# Patient Record
Sex: Male | Born: 1955 | Race: White | Hispanic: No | Marital: Married | Smoking: Former smoker
Health system: Southern US, Community
[De-identification: ages and names within clinical notes are randomized; demographics above are authoritative.]

## PROBLEM LIST (undated history)

## (undated) ENCOUNTER — Emergency Department: Admission: EM | Payer: Self-pay | Source: Home / Self Care

## (undated) ENCOUNTER — Ambulatory Visit: Admission: EM | Payer: HMO | Source: Home / Self Care

## (undated) DIAGNOSIS — M009 Pyogenic arthritis, unspecified: Secondary | ICD-10-CM

## (undated) DIAGNOSIS — F17200 Nicotine dependence, unspecified, uncomplicated: Secondary | ICD-10-CM

## (undated) DIAGNOSIS — E785 Hyperlipidemia, unspecified: Secondary | ICD-10-CM

## (undated) DIAGNOSIS — J189 Pneumonia, unspecified organism: Secondary | ICD-10-CM

## (undated) DIAGNOSIS — I10 Essential (primary) hypertension: Secondary | ICD-10-CM

## (undated) DIAGNOSIS — I2510 Atherosclerotic heart disease of native coronary artery without angina pectoris: Secondary | ICD-10-CM

## (undated) DIAGNOSIS — E119 Type 2 diabetes mellitus without complications: Secondary | ICD-10-CM

## (undated) DIAGNOSIS — I219 Acute myocardial infarction, unspecified: Secondary | ICD-10-CM

## (undated) HISTORY — DX: Atherosclerotic heart disease of native coronary artery without angina pectoris: I25.10

## (undated) HISTORY — DX: Hyperlipidemia, unspecified: E78.5

## (undated) HISTORY — DX: Nicotine dependence, unspecified, uncomplicated: F17.200

## (undated) HISTORY — DX: Pyogenic arthritis, unspecified: M00.9

## (undated) HISTORY — DX: Type 2 diabetes mellitus without complications: E11.9

## (undated) HISTORY — PX: COLON SURGERY: SHX602

## (undated) HISTORY — PX: OTHER SURGICAL HISTORY: SHX169

## (undated) HISTORY — PX: APPENDECTOMY: SHX54

---
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
## 2002-12-23 ENCOUNTER — Encounter: Payer: Self-pay | Admitting: Family Medicine

## 2002-12-23 LAB — CONVERTED CEMR LAB: Hgb A1c MFr Bld: 7.9 %

## 2003-04-23 ENCOUNTER — Encounter: Payer: Self-pay | Admitting: Family Medicine

## 2003-04-23 LAB — CONVERTED CEMR LAB: Hgb A1c MFr Bld: 5.5 %

## 2003-10-23 ENCOUNTER — Encounter: Payer: Self-pay | Admitting: Family Medicine

## 2003-10-23 LAB — CONVERTED CEMR LAB: Hgb A1c MFr Bld: 5.7 %

## 2004-04-22 ENCOUNTER — Encounter: Payer: Self-pay | Admitting: Family Medicine

## 2004-04-22 LAB — CONVERTED CEMR LAB: Hgb A1c MFr Bld: 5.9 %

## 2004-10-22 ENCOUNTER — Encounter: Payer: Self-pay | Admitting: Family Medicine

## 2005-01-20 ENCOUNTER — Encounter: Payer: Self-pay | Admitting: Family Medicine

## 2005-02-04 ENCOUNTER — Ambulatory Visit: Payer: Self-pay | Admitting: Family Medicine

## 2005-02-09 ENCOUNTER — Ambulatory Visit: Payer: Self-pay | Admitting: Family Medicine

## 2005-02-24 ENCOUNTER — Ambulatory Visit: Payer: Self-pay | Admitting: Family Medicine

## 2005-04-22 ENCOUNTER — Encounter: Payer: Self-pay | Admitting: Family Medicine

## 2005-04-22 LAB — CONVERTED CEMR LAB: PSA: 0.31 ng/mL

## 2005-05-03 ENCOUNTER — Ambulatory Visit: Payer: Self-pay | Admitting: Family Medicine

## 2005-05-05 ENCOUNTER — Ambulatory Visit: Payer: Self-pay | Admitting: Family Medicine

## 2005-11-08 ENCOUNTER — Ambulatory Visit: Payer: Self-pay | Admitting: Family Medicine

## 2006-05-22 ENCOUNTER — Encounter: Payer: Self-pay | Admitting: Family Medicine

## 2006-05-22 LAB — CONVERTED CEMR LAB: PSA: 0.61 ng/mL

## 2006-06-08 ENCOUNTER — Ambulatory Visit: Payer: Self-pay | Admitting: Family Medicine

## 2006-06-10 ENCOUNTER — Ambulatory Visit: Payer: Self-pay | Admitting: Family Medicine

## 2006-06-23 ENCOUNTER — Ambulatory Visit: Payer: Self-pay | Admitting: Family Medicine

## 2006-08-03 ENCOUNTER — Ambulatory Visit: Payer: Self-pay | Admitting: Family Medicine

## 2006-08-04 ENCOUNTER — Ambulatory Visit: Payer: Self-pay | Admitting: Family Medicine

## 2006-08-05 ENCOUNTER — Ambulatory Visit: Payer: Self-pay | Admitting: Family Medicine

## 2006-08-08 ENCOUNTER — Ambulatory Visit: Payer: Self-pay | Admitting: Family Medicine

## 2006-09-12 ENCOUNTER — Ambulatory Visit: Payer: Self-pay | Admitting: Family Medicine

## 2006-11-22 ENCOUNTER — Encounter: Payer: Self-pay | Admitting: Family Medicine

## 2006-11-22 LAB — CONVERTED CEMR LAB: Hgb A1c MFr Bld: 7.2 %

## 2006-12-12 ENCOUNTER — Ambulatory Visit: Payer: Self-pay | Admitting: Family Medicine

## 2006-12-12 LAB — CONVERTED CEMR LAB
BUN: 9 mg/dL (ref 6–23)
Creatinine, Ser: 0.9 mg/dL (ref 0.4–1.5)
GFR calc Af Amer: 115 mL/min
GFR calc non Af Amer: 95 mL/min
Hgb A1c MFr Bld: 7.2 %
Hgb A1c MFr Bld: 7.2 % — ABNORMAL HIGH (ref 4.6–6.0)
Potassium: 4.3 meq/L (ref 3.5–5.1)
Sodium: 139 meq/L (ref 135–145)

## 2006-12-14 ENCOUNTER — Ambulatory Visit: Payer: Self-pay | Admitting: Family Medicine

## 2007-02-08 ENCOUNTER — Encounter: Payer: Self-pay | Admitting: Family Medicine

## 2007-02-08 DIAGNOSIS — E1129 Type 2 diabetes mellitus with other diabetic kidney complication: Secondary | ICD-10-CM | POA: Insufficient documentation

## 2007-02-08 DIAGNOSIS — E786 Lipoprotein deficiency: Secondary | ICD-10-CM | POA: Insufficient documentation

## 2007-02-08 DIAGNOSIS — F17200 Nicotine dependence, unspecified, uncomplicated: Secondary | ICD-10-CM | POA: Insufficient documentation

## 2007-02-08 DIAGNOSIS — R809 Proteinuria, unspecified: Secondary | ICD-10-CM

## 2007-02-08 DIAGNOSIS — I10 Essential (primary) hypertension: Secondary | ICD-10-CM | POA: Insufficient documentation

## 2007-05-18 ENCOUNTER — Encounter (INDEPENDENT_AMBULATORY_CARE_PROVIDER_SITE_OTHER): Payer: Self-pay | Admitting: *Deleted

## 2007-05-22 ENCOUNTER — Ambulatory Visit: Payer: Self-pay | Admitting: Family Medicine

## 2007-05-22 LAB — CONVERTED CEMR LAB
ALT: 35 units/L (ref 0–53)
AST: 24 units/L (ref 0–37)
Albumin: 3.9 g/dL (ref 3.5–5.2)
Alkaline Phosphatase: 40 units/L (ref 39–117)
BUN: 11 mg/dL (ref 6–23)
Basophils Absolute: 0.1 10*3/uL (ref 0.0–0.1)
Basophils Relative: 0.7 % (ref 0.0–1.0)
Bilirubin, Direct: 0.1 mg/dL (ref 0.0–0.3)
CO2: 30 meq/L (ref 19–32)
Calcium: 9.6 mg/dL (ref 8.4–10.5)
Chloride: 109 meq/L (ref 96–112)
Cholesterol: 165 mg/dL (ref 0–200)
Creatinine, Ser: 0.8 mg/dL (ref 0.4–1.5)
Creatinine,U: 92.2 mg/dL
Eosinophils Absolute: 0.7 10*3/uL — ABNORMAL HIGH (ref 0.0–0.6)
Eosinophils Relative: 7.2 % — ABNORMAL HIGH (ref 0.0–5.0)
GFR calc Af Amer: 131 mL/min
GFR calc non Af Amer: 108 mL/min
Glucose, Bld: 127 mg/dL — ABNORMAL HIGH (ref 70–99)
HCT: 45.4 % (ref 39.0–52.0)
HDL: 35.8 mg/dL — ABNORMAL LOW (ref >39.0)
Hemoglobin: 15.5 g/dL (ref 13.0–17.0)
Hgb A1c MFr Bld: 7 % — ABNORMAL HIGH (ref 4.6–6.0)
LDL Cholesterol: 103 mg/dL — ABNORMAL HIGH (ref 0–99)
Lymphocytes Relative: 30.7 % (ref 12.0–46.0)
MCHC: 34.2 g/dL (ref 30.0–36.0)
MCV: 87.3 fL (ref 78.0–100.0)
Microalb Creat Ratio: 2.2 mg/g (ref 0.0–30.0)
Microalb, Ur: 0.2 mg/dL (ref 0.0–1.9)
Monocytes Absolute: 0.6 10*3/uL (ref 0.2–0.7)
Monocytes Relative: 6.5 % (ref 3.0–11.0)
Neutro Abs: 5 10*3/uL (ref 1.4–7.7)
Neutrophils Relative %: 54.9 % (ref 43.0–77.0)
PSA: 0.38 ng/mL (ref 0.10–4.00)
Platelets: 206 10*3/uL (ref 150–400)
Potassium: 4.4 meq/L (ref 3.5–5.1)
RBC: 5.2 M/uL (ref 4.22–5.81)
RDW: 13.2 % (ref 11.5–14.6)
Sodium: 141 meq/L (ref 135–145)
TSH: 1.18 microintl units/mL (ref 0.35–5.50)
Total Bilirubin: 0.7 mg/dL (ref 0.3–1.2)
Total CHOL/HDL Ratio: 4.6
Total Protein: 6.9 g/dL (ref 6.0–8.3)
Triglycerides: 131 mg/dL (ref 0–149)
VLDL: 26 mg/dL (ref 0–40)
WBC: 9.3 10*3/uL (ref 4.5–10.5)

## 2007-05-24 ENCOUNTER — Ambulatory Visit: Payer: Self-pay | Admitting: Family Medicine

## 2007-11-03 ENCOUNTER — Ambulatory Visit: Payer: Self-pay | Admitting: Family Medicine

## 2007-11-27 ENCOUNTER — Ambulatory Visit: Payer: Self-pay | Admitting: Family Medicine

## 2007-11-29 ENCOUNTER — Ambulatory Visit: Payer: Self-pay | Admitting: Family Medicine

## 2008-05-29 ENCOUNTER — Ambulatory Visit: Payer: Self-pay | Admitting: Family Medicine

## 2008-05-29 LAB — CONVERTED CEMR LAB
ALT: 37 units/L (ref 0–53)
Alkaline Phosphatase: 42 units/L (ref 39–117)
Basophils Absolute: 0.1 10*3/uL (ref 0.0–0.1)
Bilirubin, Direct: 0.1 mg/dL (ref 0.0–0.3)
CO2: 28 meq/L (ref 19–32)
Calcium: 9.4 mg/dL (ref 8.4–10.5)
Cholesterol: 172 mg/dL (ref 0–200)
GFR calc Af Amer: 131 mL/min
Glucose, Bld: 124 mg/dL — ABNORMAL HIGH (ref 70–99)
HDL: 32.6 mg/dL — ABNORMAL LOW (ref >39.0)
LDL Cholesterol: 102 mg/dL — ABNORMAL HIGH (ref 0–99)
Lymphocytes Relative: 30.7 % (ref 12.0–46.0)
MCHC: 35.1 g/dL (ref 30.0–36.0)
Microalb Creat Ratio: 2 mg/g (ref 0.0–30.0)
Microalb, Ur: 0.2 mg/dL (ref 0.0–1.9)
Monocytes Relative: 7.3 % (ref 3.0–12.0)
Neutro Abs: 5.2 10*3/uL (ref 1.4–7.7)
PSA: 0.63 ng/mL (ref 0.10–4.00)
Platelets: 294 10*3/uL (ref 150–400)
Potassium: 4.4 meq/L (ref 3.5–5.1)
RDW: 13.4 % (ref 11.5–14.6)
Sodium: 140 meq/L (ref 135–145)
TSH: 0.92 microintl units/mL (ref 0.35–5.50)
Total Bilirubin: 0.9 mg/dL (ref 0.3–1.2)
Total CHOL/HDL Ratio: 5.3
Total Protein: 6.6 g/dL (ref 6.0–8.3)
Triglycerides: 185 mg/dL — ABNORMAL HIGH (ref 0–149)
VLDL: 37 mg/dL (ref 0–40)

## 2008-06-03 ENCOUNTER — Ambulatory Visit: Payer: Self-pay | Admitting: Family Medicine

## 2008-06-03 DIAGNOSIS — N529 Male erectile dysfunction, unspecified: Secondary | ICD-10-CM | POA: Insufficient documentation

## 2008-11-26 ENCOUNTER — Ambulatory Visit: Payer: Self-pay | Admitting: Family Medicine

## 2008-11-26 LAB — CONVERTED CEMR LAB: Hgb A1c MFr Bld: 6.4 % — ABNORMAL HIGH (ref 4.6–6.0)

## 2008-12-02 ENCOUNTER — Ambulatory Visit: Payer: Self-pay | Admitting: Family Medicine

## 2009-05-05 ENCOUNTER — Encounter (INDEPENDENT_AMBULATORY_CARE_PROVIDER_SITE_OTHER): Payer: Self-pay | Admitting: *Deleted

## 2010-06-24 ENCOUNTER — Encounter (INDEPENDENT_AMBULATORY_CARE_PROVIDER_SITE_OTHER): Payer: Self-pay | Admitting: *Deleted

## 2010-12-22 NOTE — Letter (Signed)
Summary: Nadara Eaton letter  [place] [facility]  [address] [place], [place] [address]   Phone: [phone]  Fax: [phone]       06/24/2010 MRN: [id number]  [facility] [place] [address] [place]  [address]  Dear Mr. Modena Nunnery,  [facility], and [facility] announce the retirement of Arta Silence, M.D., from full-time practice at the [facility] office effective May 21, 2010 and his plans of returning part-time.  It is important to Dr. Hetty Ely and to our practice that you understand that [facility] has seven physicians in our office for your health care needs.  We will continue to offer the same exceptional care that you have today.    Dr. Hetty Ely has spoken to many of you about his plans for retirement and returning part-time in the fall.   We will continue to work with you through the transition to schedule appointments for you in the office and meet the high standards that [facility] is committed to.   Again, it is with great pleasure that we share the news that Dr. Hetty Ely will return to [facility] in October of 2011 with a reduced schedule.    If you have any questions, or would like to request an appointment with one of our physicians, please call us at [phone] and press the option for Scheduling an appointment.  We take pleasure in providing you with excellent patient care and look forward to seeing you at your next office visit.  Our [facility] Physicians are:  Tillman Abide, M.D. Laurita Quint, M.D. Roxy Manns, M.D. Kerby Nora, M.D. Hannah Beat, M.D. Ruthe Mannan, M.D. We proudly welcomed Raechel Ache, M.D. and Eustaquio Boyden, M.D. to the practice in July/August 2011.  Sincerely,  [facility]

## 2012-06-02 ENCOUNTER — Ambulatory Visit (INDEPENDENT_AMBULATORY_CARE_PROVIDER_SITE_OTHER): Payer: BC Managed Care – PPO | Admitting: Family Medicine

## 2012-06-02 ENCOUNTER — Encounter: Payer: Self-pay | Admitting: Family Medicine

## 2012-06-02 VITALS — BP 150/90 | HR 80 | Temp 98.0°F | Ht 70.5 in | Wt 251.8 lb

## 2012-06-02 DIAGNOSIS — R5381 Other malaise: Secondary | ICD-10-CM

## 2012-06-02 DIAGNOSIS — I10 Essential (primary) hypertension: Secondary | ICD-10-CM

## 2012-06-02 DIAGNOSIS — L989 Disorder of the skin and subcutaneous tissue, unspecified: Secondary | ICD-10-CM

## 2012-06-02 DIAGNOSIS — Z125 Encounter for screening for malignant neoplasm of prostate: Secondary | ICD-10-CM

## 2012-06-02 DIAGNOSIS — R5383 Other fatigue: Secondary | ICD-10-CM

## 2012-06-02 DIAGNOSIS — E119 Type 2 diabetes mellitus without complications: Secondary | ICD-10-CM

## 2012-06-02 DIAGNOSIS — L570 Actinic keratosis: Secondary | ICD-10-CM | POA: Insufficient documentation

## 2012-06-02 LAB — COMPREHENSIVE METABOLIC PANEL
AST: 28 U/L (ref 0–37)
Albumin: 4 g/dL (ref 3.5–5.2)
Alkaline Phosphatase: 60 U/L (ref 39–117)
BUN: 13 mg/dL (ref 6–23)
Glucose, Bld: 209 mg/dL — ABNORMAL HIGH (ref 70–99)
Potassium: 4.5 mEq/L (ref 3.5–5.1)
Total Bilirubin: 0.8 mg/dL (ref 0.3–1.2)

## 2012-06-02 LAB — PSA: PSA: 0.38 ng/mL (ref 0.10–4.00)

## 2012-06-02 LAB — LIPID PANEL
Cholesterol: 158 mg/dL (ref 0–200)
HDL: 36.1 mg/dL — ABNORMAL LOW (ref >39.00)
LDL Cholesterol: 98 mg/dL (ref 0–99)
Total CHOL/HDL Ratio: 4
Triglycerides: 118 mg/dL (ref 0.0–149.0)
VLDL: 23.6 mg/dL (ref 0.0–40.0)

## 2012-06-02 MED ORDER — METFORMIN HCL 500 MG PO TABS: ORAL_TABLET | ORAL | Status: DC

## 2012-06-02 NOTE — Assessment & Plan Note (Signed)
Prostate cancer screening and PSA options (with potential risks and benefits of testing vs not testing) were discussed along with recent recs/guidelines.  He agrees for testing PSA at this point.

## 2012-06-02 NOTE — Assessment & Plan Note (Signed)
Check labs today and d/w pt about diet/weight/exercise.  See notes on labs.

## 2012-06-02 NOTE — Patient Instructions (Addendum)
We'll contact you with your lab report. Take care.  We'll set the follow up appointment when I see your labs.  See Shirlee Limerick about your referral before you leave today.

## 2012-06-02 NOTE — Progress Notes (Signed)
New patient to re est care.  Prev patient here, but not seen in years.   Fatigue noted.  Had been caring for parents, both of whom died relatively recently.  No other acute changes.  He does have mult med problems:  Diabetes:  Using medications without difficulties: no meds Hypoglycemic episodes:none known Hyperglycemic episodes: none Feet problems:no Blood Sugars averaging: 120-130 usually, up to 160-180  Hypertension:   Off meds Chest pain with exertion:no Edema:no Short of breath:no Due for labs, pending.   Prostate cancer screening and PSA options (with potential risks and benefits of testing vs not testing) were discussed along with recent recs/guidelines.  He agrees for testing PSA at this point.  Mult skin lesions noted by patient, discussed.  One on scalp for about 3 months.  Mult older lesions on the arms diffusely.   PMH and SH reviewed.   Vital signs, Meds and allergies reviewed.  ROS: See HPI.  Otherwise nontributory.   GEN: nad, alert and oriented HEENT: mucous membranes moist NECK: supple w/o LA CV: rrr.  no murmur PULM: ctab, no inc wob ABD: soft, +bs EXT: no edema SKIN: no acute rash but round puffy lesion with central irritation noted on the posterior scalp, near the superior hairline, diffuse AKs noted on the arms  Diabetic foot exam: Normal inspection No skin breakdown No calluses  Normal DP pulses Normal sensation to light tough and monofilament Nails normal

## 2012-06-02 NOTE — Addendum Note (Signed)
Addended by: Joaquim Nam on: 06/02/2012 04:04 PM   Modules accepted: Orders

## 2012-06-02 NOTE — Assessment & Plan Note (Addendum)
Check labs today and d/w pt about diet/weight/exercise.  See notes on labs.  He'll likely need metformin, ACE and statin but I want to see labs first.  Will set f/u after labs resulted.

## 2012-06-02 NOTE — Assessment & Plan Note (Signed)
Refer to derm, he could have a SCC vs BCC on the scalp along with diffuse AKs.  He agrees.

## 2012-06-08 ENCOUNTER — Encounter: Payer: Self-pay | Admitting: *Deleted

## 2012-09-05 ENCOUNTER — Other Ambulatory Visit: Payer: BC Managed Care – PPO

## 2012-09-06 ENCOUNTER — Other Ambulatory Visit (INDEPENDENT_AMBULATORY_CARE_PROVIDER_SITE_OTHER): Payer: BC Managed Care – PPO

## 2012-09-06 DIAGNOSIS — E119 Type 2 diabetes mellitus without complications: Secondary | ICD-10-CM

## 2012-09-08 ENCOUNTER — Ambulatory Visit (INDEPENDENT_AMBULATORY_CARE_PROVIDER_SITE_OTHER): Payer: BC Managed Care – PPO | Admitting: Family Medicine

## 2012-09-08 ENCOUNTER — Encounter: Payer: Self-pay | Admitting: Family Medicine

## 2012-09-08 VITALS — BP 142/90 | HR 82 | Temp 97.6°F | Wt 257.0 lb

## 2012-09-08 DIAGNOSIS — Z1211 Encounter for screening for malignant neoplasm of colon: Secondary | ICD-10-CM

## 2012-09-08 DIAGNOSIS — E119 Type 2 diabetes mellitus without complications: Secondary | ICD-10-CM

## 2012-09-08 DIAGNOSIS — Z23 Encounter for immunization: Secondary | ICD-10-CM

## 2012-09-08 NOTE — Patient Instructions (Addendum)
Keep working on [employer].  Recheck a1c in about 3 months, come see me after that.  Go to the lab on the way out.  We'll contact you with your lab report. Take care.

## 2012-09-08 NOTE — Progress Notes (Signed)
D/w patient [id number] for colon cancer screening, including IFOB vs. colonoscopy.  Risks and benefits of both were discussed and patient voiced understanding.  Pt elects for: IFOB.   Diabetes:  Using medications without difficulties:yes, titrated up to 1000mg  of metformin.  Hypoglycemic episodes: no Hyperglycemic episodes: no Feet problems:no Blood Sugars averaging: ~120-160 eye exam within last year: A1c with some improvement, discussed.  He still needs to work on diet, discussed.  "I need to work on my potatoes."  He has cut out carbs prev.   Plan to recheck A1c in about 3 months.   Due for flu and PNA shot.   Meds, vitals, and allergies reviewed.   ROS: See HPI.  Otherwise negative.    GEN: nad, alert and oriented HEENT: mucous membranes moist NECK: supple w/o LA CV: rrr. PULM: ctab, no inc wob ABD: soft, +bs EXT: no edema SKIN: no acute rash  Diabetic foot exam: Normal inspection No skin breakdown No calluses  Normal DP pulses Normal sensation to light touch and monofilament Nails normal

## 2012-09-09 DIAGNOSIS — Z1211 Encounter for screening for malignant neoplasm of colon: Secondary | ICD-10-CM | POA: Insufficient documentation

## 2012-09-09 NOTE — Assessment & Plan Note (Signed)
A1c with some improvement, discussed.  He still needs to work on diet, discussed.  "I need to work on my potatoes."  He has cut out carbs prev.   Plan to recheck A1c in about 3 months.  Will eventually add on ACE, discussed.  He'll work on diet first.  He agrees with plan.  Flu and PNA shot done today.

## 2012-09-09 NOTE — Assessment & Plan Note (Signed)
D/w patient re:options for colon cancer screening, including IFOB vs. colonoscopy.  Risks and benefits of both were discussed and patient voiced understanding.  Pt elects for:IFOB  

## 2012-12-05 ENCOUNTER — Other Ambulatory Visit (INDEPENDENT_AMBULATORY_CARE_PROVIDER_SITE_OTHER): Payer: BC Managed Care – PPO

## 2012-12-05 DIAGNOSIS — E119 Type 2 diabetes mellitus without complications: Secondary | ICD-10-CM

## 2012-12-12 ENCOUNTER — Ambulatory Visit (INDEPENDENT_AMBULATORY_CARE_PROVIDER_SITE_OTHER): Payer: BC Managed Care – PPO | Admitting: Family Medicine

## 2012-12-12 ENCOUNTER — Encounter: Payer: Self-pay | Admitting: Family Medicine

## 2012-12-12 VITALS — BP 156/86 | HR 75 | Temp 97.8°F | Wt 255.0 lb

## 2012-12-12 DIAGNOSIS — E119 Type 2 diabetes mellitus without complications: Secondary | ICD-10-CM

## 2012-12-12 NOTE — Patient Instructions (Addendum)
Call about the eye clinic appointment.  Recheck in 6 months with labs ahead of time at a physical.

## 2012-12-12 NOTE — Progress Notes (Signed)
Diabetes:  Using medications without difficulties:yes Hypoglycemic episodes:no Hyperglycemic episodes:no Feet problems:no Blood Sugars averaging: ~130 in AM eye exam within last year: no, due for f/u.  Discussed.   He cut out potatoes and rice and his A1c dropped 1 point.   He feels better overall, he feels well.   Meds, vitals, and allergies reviewed.   ROS: See HPI.  Otherwise negative.    GEN: nad, alert and oriented HEENT: mucous membranes moist NECK: supple w/o LA CV: rrr. PULM: ctab, no inc wob ABD: soft, +bs EXT: no edema SKIN: no acute rash  Diabetic foot exam: Normal inspection No skin breakdown No calluses  Normal DP pulses Normal sensation to light touch and monofilament Nails normal

## 2012-12-12 NOTE — Assessment & Plan Note (Signed)
Continue current meds, work on diet, A1c recheck in 6 months at a CPE.  We can add on ACE at that point.  He agrees.

## 2013-05-31 ENCOUNTER — Other Ambulatory Visit: Payer: Self-pay

## 2013-06-03 ENCOUNTER — Other Ambulatory Visit: Payer: Self-pay | Admitting: Family Medicine

## 2013-06-03 DIAGNOSIS — E119 Type 2 diabetes mellitus without complications: Secondary | ICD-10-CM

## 2013-06-04 ENCOUNTER — Other Ambulatory Visit: Payer: BC Managed Care – PPO

## 2013-06-08 ENCOUNTER — Other Ambulatory Visit (INDEPENDENT_AMBULATORY_CARE_PROVIDER_SITE_OTHER): Payer: BC Managed Care – PPO

## 2013-06-08 DIAGNOSIS — I10 Essential (primary) hypertension: Secondary | ICD-10-CM

## 2013-06-08 DIAGNOSIS — E119 Type 2 diabetes mellitus without complications: Secondary | ICD-10-CM

## 2013-06-08 LAB — LIPID PANEL
Cholesterol: 142 mg/dL (ref 0–200)
HDL: 35.5 mg/dL — ABNORMAL LOW (ref >39.00)
Triglycerides: 159 mg/dL — ABNORMAL HIGH (ref 0.0–149.0)

## 2013-06-08 LAB — COMPREHENSIVE METABOLIC PANEL
Albumin: 4.1 g/dL (ref 3.5–5.2)
BUN: 13 mg/dL (ref 6–23)
Calcium: 9.3 mg/dL (ref 8.4–10.5)
Chloride: 103 mEq/L (ref 96–112)
Creatinine, Ser: 0.7 mg/dL (ref 0.4–1.5)
GFR: 119.54 mL/min (ref >60.00)
Glucose, Bld: 128 mg/dL — ABNORMAL HIGH (ref 70–99)
Potassium: 4.5 mEq/L (ref 3.5–5.1)

## 2013-06-11 ENCOUNTER — Ambulatory Visit: Payer: BC Managed Care – PPO | Admitting: Family Medicine

## 2013-06-11 DIAGNOSIS — E119 Type 2 diabetes mellitus without complications: Secondary | ICD-10-CM

## 2013-06-11 NOTE — Assessment & Plan Note (Signed)
See scanned forms

## 2013-06-11 NOTE — Progress Notes (Signed)
See scanned forms

## 2013-07-05 ENCOUNTER — Other Ambulatory Visit (INDEPENDENT_AMBULATORY_CARE_PROVIDER_SITE_OTHER): Payer: BC Managed Care – PPO

## 2013-07-05 DIAGNOSIS — Z1211 Encounter for screening for malignant neoplasm of colon: Secondary | ICD-10-CM

## 2013-07-06 ENCOUNTER — Encounter: Payer: Self-pay | Admitting: *Deleted

## 2013-08-07 ENCOUNTER — Emergency Department (HOSPITAL_COMMUNITY)
Admission: EM | Admit: 2013-08-07 | Discharge: 2013-08-07 | Disposition: A | Discharge status: Home / Self Care | Payer: BC Managed Care – PPO | Attending: Emergency Medicine | Admitting: Emergency Medicine

## 2013-08-07 ENCOUNTER — Encounter (HOSPITAL_COMMUNITY): Payer: Self-pay | Admitting: Emergency Medicine

## 2013-08-07 DIAGNOSIS — M1990 Unspecified osteoarthritis, unspecified site: Secondary | ICD-10-CM | POA: Insufficient documentation

## 2013-08-07 DIAGNOSIS — H5789 Other specified disorders of eye and adnexa: Secondary | ICD-10-CM

## 2013-08-07 DIAGNOSIS — Z7982 Long term (current) use of aspirin: Secondary | ICD-10-CM | POA: Insufficient documentation

## 2013-08-07 DIAGNOSIS — Z79899 Other long term (current) drug therapy: Secondary | ICD-10-CM | POA: Insufficient documentation

## 2013-08-07 DIAGNOSIS — E119 Type 2 diabetes mellitus without complications: Secondary | ICD-10-CM | POA: Insufficient documentation

## 2013-08-07 DIAGNOSIS — F17200 Nicotine dependence, unspecified, uncomplicated: Secondary | ICD-10-CM | POA: Insufficient documentation

## 2013-08-07 DIAGNOSIS — H5710 Ocular pain, unspecified eye: Secondary | ICD-10-CM | POA: Insufficient documentation

## 2013-08-07 MED ORDER — FLUORESCEIN SODIUM 1 MG OP STRP
1.0000 | ORAL_STRIP | Freq: Once | OPHTHALMIC | Status: AC
  Administered 2013-08-07: 10:00:00 via OPHTHALMIC

## 2013-08-07 MED ORDER — ERYTHROMYCIN 5 MG/GM OP OINT: TOPICAL_OINTMENT | OPHTHALMIC | Status: DC

## 2013-08-07 MED ORDER — TETRACAINE HCL 0.5 % OP SOLN
1.0000 [drp] | Freq: Once | OPHTHALMIC | Status: AC
  Administered 2013-08-07: 1 [drp] via OPHTHALMIC
  Filled 2013-08-07: qty 2

## 2013-08-07 MED ORDER — FLUORESCEIN SODIUM 1 MG OP STRP
ORAL_STRIP | OPHTHALMIC | Status: AC
  Administered 2013-08-07: 1 via OPHTHALMIC
  Filled 2013-08-07: qty 1

## 2013-08-07 MED ORDER — FLUORESCEIN SODIUM 1 MG OP STRP
1.0000 | ORAL_STRIP | Freq: Once | OPHTHALMIC | Status: AC
  Administered 2013-08-07: 10:00:00 via OPHTHALMIC
  Filled 2013-08-07: qty 1

## 2013-08-07 NOTE — ED Provider Notes (Signed)
CSN: [id number]     Arrival date & time 08/07/13  4098 History   First MD Initiated Contact with Patient 08/07/13 [phone]     Chief Complaint  Patient presents with  . eye irritation    (Consider location/radiation/quality/duration/timing/severity/associated sxs/prior Treatment) HPI Comments: Patient is a 57 year old male with history of diabetes, septic arthritis who presents today with eye irritation for the past hour. He was working in the garden earlier when he got a milky substance of a plant in the point that a family on his hands and proceeded to touch his eyes. Since that time has had a burning sensation in his eyes bilaterally. He is not a contact wearer. He wears reading glasses. He has tried flushing his eye out for the past hour with Visine. He denies blurry vision, double vision, floaters. No photophobia.  The history is provided by the patient. No language interpreter was used.    Past Medical History  Diagnosis Date  . Diabetes mellitus   . Septic arthritis     R shoulder, history of  . Smoker    Past Surgical History  Procedure Laterality Date  . Appendectomy     Family History  Problem Relation Age of Onset  . Diabetes Father   . Autoimmune disease Father     liver and renal disease  . Diabetes Maternal Grandfather   . Cancer Mother     multiple myeloma  . Colon cancer Neg Hx    History  Substance Use Topics  . Smoking status: Current Every Day Smoker -- 1.50 packs/day    Types: Cigarettes  . Smokeless tobacco: Never Used  . Alcohol Use: No     Comment: rarely    Review of Systems  Constitutional: Negative for fever and chills.  Eyes: Positive for pain, discharge and redness. Negative for photophobia, itching and visual disturbance.  All other systems reviewed and are negative.    Allergies  Sulfa antibiotics  Home Medications   Current Outpatient Rx  Name  Route  Sig  Dispense  Refill  . aspirin 81 MG tablet   Oral   Take 81 mg by mouth  daily.         . metFORMIN (GLUCOPHAGE) 500 MG tablet   Oral   Take 1,000 mg by mouth 2 (two) times daily with a meal.         . Multiple Vitamin (MULTIVITAMIN) tablet   Oral   Take 1 tablet by mouth daily.         Marland Kitchen erythromycin ophthalmic ointment      Place a 1/2 inch ribbon of ointment into the lower eyelid.   1 g   0    BP 167/86  Pulse 77  Temp(Src) 97.4 F (36.3 C) (Oral)  Resp 18  Ht 6' (1.829 m)  Wt 250 lb (113.399 kg)  BMI 33.9 kg/m2  SpO2 97% Physical Exam  Nursing note and vitals reviewed. Constitutional: He is oriented to person, place, and time. He appears well-developed and well-nourished. He appears distressed.  HENT:  Head: Normocephalic and atraumatic.  Right Ear: External ear normal.  Left Ear: External ear normal.  Nose: Nose normal.  Eyes: Conjunctivae are normal. Lids are everted and swept, no foreign bodies found.  Slit lamp exam:      The right eye shows no corneal abrasion, no corneal flare, no corneal ulcer, no foreign body, no hyphema, no hypopyon, no fluorescein uptake and no anterior chamber bulge.  The left eye shows no corneal abrasion, no corneal flare, no corneal ulcer, no foreign body, no hyphema, no hypopyon, no fluorescein uptake and no anterior chamber bulge.  Eyes watering and mildly injected bilaterally. PH 8 bilaterally. No FB visualized. No abrasions, ulcerations. No seidel sign.   Neck: Normal range of motion. Neck supple. No tracheal deviation present.  Cardiovascular: Normal rate, regular rhythm and normal heart sounds.   Pulmonary/Chest: Effort normal and breath sounds normal. No stridor.  Abdominal: Soft. He exhibits no distension. There is no tenderness.  Musculoskeletal: Normal range of motion.  Neurological: He is alert and oriented to person, place, and time.  Skin: Skin is warm and dry. He is not diaphoretic.  Psychiatric: He has a normal mood and affect. His behavior is normal.    ED Course  Procedures  (including critical care time) Labs Review Labs Reviewed - No data to display Imaging Review No results found.  MDM   1. Eye irritation    Patient presents with eye irritation bilaterally after getting a plant substance in his eye. Eyes were flushed in the emergency department with water. PH of 8 bilaterally. No fluorescein dye uptake. No change in visual acuity. He was given erythromycin ointment for home use. He was given ophthalmology followup and strict return instructions. Dr. Bebe Shaggy evaluated the patient and agrees with plan. Vital signs stable for discharge. Patient / Family / Caregiver informed of clinical course, understand medical decision-making process, and agree with plan.     Mora Bellman, PA-C 08/07/13 [phone]

## 2013-08-07 NOTE — ED Notes (Signed)
Pt comfortable with d/c and f/u instructions. Prescriptions x1 

## 2013-08-07 NOTE — ED Notes (Signed)
PH paper to PA per request.

## 2013-08-07 NOTE — ED Notes (Signed)
Patient states was working outside this morning and "something got into my eyes.  I don't know what it was."    Patient complains of redness, and pain.

## 2013-08-08 NOTE — ED Provider Notes (Signed)
Medical screening examination/treatment/procedure(s) were conducted as a shared visit with non-physician practitioner(s) and myself.  I personally evaluated the patient during the encounter  Pt stable in the ED.  I advised flushing eyes copiously and f/u with ophthalmology   Joya Gaskins, MD 08/08/13 1116

## 2013-09-03 ENCOUNTER — Other Ambulatory Visit: Payer: Self-pay | Admitting: Family Medicine

## 2013-09-27 ENCOUNTER — Other Ambulatory Visit: Payer: Self-pay

## 2013-10-17 ENCOUNTER — Other Ambulatory Visit: Payer: Self-pay | Admitting: Family Medicine

## 2014-02-28 ENCOUNTER — Other Ambulatory Visit: Payer: Self-pay

## 2014-05-31 ENCOUNTER — Telehealth: Payer: Self-pay | Admitting: Family Medicine

## 2014-05-31 NOTE — Telephone Encounter (Signed)
Diabetic Bundle.  Pt needs f/u appt for BP and A1C.  Left vm to return call.

## 2014-09-09 ENCOUNTER — Other Ambulatory Visit: Payer: Self-pay | Admitting: Family Medicine

## 2015-03-11 ENCOUNTER — Other Ambulatory Visit: Payer: Self-pay | Admitting: Family Medicine

## 2015-03-11 DIAGNOSIS — E119 Type 2 diabetes mellitus without complications: Secondary | ICD-10-CM

## 2015-03-13 ENCOUNTER — Other Ambulatory Visit (INDEPENDENT_AMBULATORY_CARE_PROVIDER_SITE_OTHER): Payer: BLUE CROSS/BLUE SHIELD

## 2015-03-13 DIAGNOSIS — E119 Type 2 diabetes mellitus without complications: Secondary | ICD-10-CM

## 2015-03-13 LAB — LIPID PANEL
CHOL/HDL RATIO: 4
Cholesterol: 134 mg/dL (ref 0–200)
HDL: 35.1 mg/dL — AB (ref >39.00)
LDL Cholesterol: 81 mg/dL (ref 0–99)
NONHDL: 98.9
Triglycerides: 91 mg/dL (ref 0.0–149.0)
VLDL: 18.2 mg/dL (ref 0.0–40.0)

## 2015-03-13 LAB — COMPREHENSIVE METABOLIC PANEL
ALT: 27 U/L (ref 0–53)
AST: 17 U/L (ref 0–37)
Albumin: 4 g/dL (ref 3.5–5.2)
Alkaline Phosphatase: 68 U/L (ref 39–117)
BILIRUBIN TOTAL: 0.6 mg/dL (ref 0.2–1.2)
BUN: 14 mg/dL (ref 6–23)
CO2: 29 meq/L (ref 19–32)
Calcium: 9.6 mg/dL (ref 8.4–10.5)
Chloride: 103 mEq/L (ref 96–112)
Creatinine, Ser: 0.74 mg/dL (ref 0.40–1.50)
GFR: 115.11 mL/min (ref >60.00)
GLUCOSE: 192 mg/dL — AB (ref 70–99)
Potassium: 4.6 mEq/L (ref 3.5–5.1)
SODIUM: 138 meq/L (ref 135–145)
Total Protein: 6.6 g/dL (ref 6.0–8.3)

## 2015-03-13 LAB — MICROALBUMIN / CREATININE URINE RATIO
Creatinine,U: 135.4 mg/dL
Microalb Creat Ratio: 1 mg/g (ref 0.0–30.0)
Microalb, Ur: 1.4 mg/dL (ref 0.0–1.9)

## 2015-03-13 LAB — HEMOGLOBIN A1C: Hgb A1c MFr Bld: 9 % — ABNORMAL HIGH (ref 4.6–6.5)

## 2015-03-19 ENCOUNTER — Encounter: Payer: Self-pay | Admitting: Family Medicine

## 2015-03-19 ENCOUNTER — Ambulatory Visit (INDEPENDENT_AMBULATORY_CARE_PROVIDER_SITE_OTHER): Payer: BLUE CROSS/BLUE SHIELD | Admitting: Family Medicine

## 2015-03-19 VITALS — BP 160/90 | HR 100 | Temp 98.6°F | Ht 70.5 in | Wt 233.5 lb

## 2015-03-19 DIAGNOSIS — Z0000 Encounter for general adult medical examination without abnormal findings: Secondary | ICD-10-CM | POA: Diagnosis not present

## 2015-03-19 DIAGNOSIS — E119 Type 2 diabetes mellitus without complications: Secondary | ICD-10-CM

## 2015-03-19 DIAGNOSIS — Z7189 Other specified counseling: Secondary | ICD-10-CM

## 2015-03-19 DIAGNOSIS — Z1211 Encounter for screening for malignant neoplasm of colon: Secondary | ICD-10-CM

## 2015-03-19 DIAGNOSIS — I10 Essential (primary) hypertension: Secondary | ICD-10-CM | POA: Diagnosis not present

## 2015-03-19 DIAGNOSIS — L570 Actinic keratosis: Secondary | ICD-10-CM

## 2015-03-19 MED ORDER — METFORMIN HCL 500 MG PO TABS: ORAL_TABLET | ORAL | Status: DC

## 2015-03-19 NOTE — Patient Instructions (Addendum)
I would get a flu shot each fall.   Go to the lab on the way out.  We'll contact you with your lab report (stool cards). Call about an eye exam.  Check your BP a few times at home and update me.   Take care.  Glad to see you.  Recheck A1c in about 3 months.

## 2015-03-19 NOTE — Progress Notes (Signed)
Pre visit review using our clinic review tool, if applicable. No additional management support is needed unless otherwise documented below in the visit note.  CPE- See plan.  Routine anticipatory guidance given to patient.  See health maintenance. Tetanus 2009 Flu prev done, encouraged.  PNA prev done.   Shingles d/w pt.  Due at 37.   Prostate cancer screening and PSA options (with potential risks and benefits of testing vs not testing) were discussed along with recent recs/guidelines.  He declined testing PSA at this point. D/w patient [id number] for colon cancer screening, including IFOB vs. colonoscopy.  Risks and benefits of both were discussed and patient voiced understanding.  Pt elects for: IFOB.   Living will d/w pt.  Would have his wife designated if patient were incapacitated.   Diet and exercise d/w pt. Working on both, intentional weight loss.    Diabetes:  Using medications without difficulties: frequently missed PM doses of metformin.  No ade on med Hypoglycemic episodes: no Hyperglycemic episodes:  Feet problems: no tingling.   Blood Sugars averaging: in AM usually ~115 up to 200.  Usually ~150.   eye exam within last year:due, encouraged.   BP up today.  Hasn't checked BP recently at home.  D/w pt.    PMH and SH reviewed  Meds, vitals, and allergies reviewed.   ROS: See HPI.  Otherwise negative.    GEN: nad, alert and oriented HEENT: mucous membranes moist NECK: supple w/o LA CV: rrr. PULM: ctab, no inc wob ABD: soft, +bs EXT: no edema SKIN: no acute rash but diffuse AKs noted on skin, he'll fu with derm.   Diabetic foot exam: Normal inspection No skin breakdown No calluses  Normal DP pulses Normal sensation to light touch and monofilament Nails normal other than nail fungus on L 1st nail

## 2015-03-20 ENCOUNTER — Telehealth: Payer: Self-pay | Admitting: Family Medicine

## 2015-03-20 DIAGNOSIS — Z119 Encounter for screening for infectious and parasitic diseases, unspecified: Secondary | ICD-10-CM | POA: Insufficient documentation

## 2015-03-20 DIAGNOSIS — Z7189 Other specified counseling: Secondary | ICD-10-CM | POA: Insufficient documentation

## 2015-03-20 NOTE — Assessment & Plan Note (Signed)
Routine anticipatory guidance given to patient.  See health maintenance. Tetanus 2009 Flu prev done, encouraged.  PNA prev done.   Shingles d/w pt.  Due at 28.   Prostate cancer screening and PSA options (with potential risks and benefits of testing vs not testing) were discussed along with recent recs/guidelines.  He declined testing PSA at this point. D/w patient [id number] for colon cancer screening, including IFOB vs. colonoscopy.  Risks and benefits of both were discussed and patient voiced understanding.  Pt elects for: IFOB.   Living will d/w pt.  Would have his wife designated if patient were incapacitated.   Diet and exercise d/w pt. Working on both, intentional weight loss.

## 2015-03-20 NOTE — Assessment & Plan Note (Signed)
He'll f/;u with derm.

## 2015-03-20 NOTE — Assessment & Plan Note (Signed)
He's going to get back on PM metformin, continue work on weight loss, and recheck a1c in about 3 months.  He agrees with plan.  He'll call about eye exam.

## 2015-03-20 NOTE — Assessment & Plan Note (Signed)
He'll check BP out of clinic and update me.  MALB wnl, not yet on ACE.

## 2015-05-14 ENCOUNTER — Telehealth: Payer: Self-pay

## 2015-05-14 NOTE — Telephone Encounter (Signed)
Diabetic Bundle. Left voicemail advising pt her A1C blood test is due. Pt advised to contact PCP's office to schedule.  

## 2016-05-03 ENCOUNTER — Other Ambulatory Visit: Payer: Self-pay | Admitting: Family Medicine

## 2016-05-03 ENCOUNTER — Other Ambulatory Visit: Payer: BLUE CROSS/BLUE SHIELD

## 2016-05-03 DIAGNOSIS — E119 Type 2 diabetes mellitus without complications: Secondary | ICD-10-CM

## 2016-05-07 ENCOUNTER — Encounter: Payer: BLUE CROSS/BLUE SHIELD | Admitting: Family Medicine

## 2016-11-01 ENCOUNTER — Encounter: Payer: Self-pay | Admitting: Family Medicine

## 2016-11-01 ENCOUNTER — Ambulatory Visit (INDEPENDENT_AMBULATORY_CARE_PROVIDER_SITE_OTHER): Payer: BLUE CROSS/BLUE SHIELD | Admitting: Family Medicine

## 2016-11-01 VITALS — BP 143/69 | HR 100 | Temp 98.2°F | Resp 14 | Wt 233.4 lb

## 2016-11-01 DIAGNOSIS — N3000 Acute cystitis without hematuria: Secondary | ICD-10-CM | POA: Diagnosis not present

## 2016-11-01 DIAGNOSIS — R300 Dysuria: Secondary | ICD-10-CM | POA: Diagnosis not present

## 2016-11-01 LAB — POCT URINALYSIS DIPSTICK
Bilirubin, UA: NEGATIVE
Blood, UA: NEGATIVE
GLUCOSE UA: POSITIVE
Ketones, UA: POSITIVE
NITRITE UA: POSITIVE
PH UA: 6
Protein, UA: POSITIVE
Spec Grav, UA: 1.025
Urobilinogen, UA: 1

## 2016-11-01 MED ORDER — CIPROFLOXACIN HCL 500 MG PO TABS: 500.0000 mg | ORAL_TABLET | Freq: Two times a day (BID) | ORAL | 0 refills | Status: DC

## 2016-11-01 NOTE — Assessment & Plan Note (Signed)
New acute problem. + symptoms and urinalysis with moderate leukocytes, positive nitrite. Unclear reason for infection. Has risk factor - Diabetes and likely underlying BPH. No recent instrumentation/procedure.  Treating with cipro. If fails to improve will need prostate exam as well as possible urology referral. Sending for culture.

## 2016-11-01 NOTE — Progress Notes (Signed)
Subjective:  Patient ID: Stephen Shelton, male    DOB: March 14, 1956  Age: 60 y.o. MRN: [id number]  CC: Urinary frequency, dysuria, incontinence  HPI:  60 year old male with hypertension, DM 2 presents with the above complaints.  Patient reports that his symptoms started on Saturday. He has had urinary frequency, urgency, and dysuria. He states that he's unable to control his urine. He's had several episodes of incontinence. This is not normal for him. He has had periods of time where he had LUTS (likely from BPH) but has never had symptoms like this before. He reports that he's had a low-grade temperature as well (100's). No back pain or flank pain. Mild suprapubic pain. No known exacerbating or relieving factors. No other complaints or concerns at this time.  Social Hx   Social History   Social History  . Marital status: Married    Spouse name: N/A  . Number of children: 3  . Years of education: N/A   Occupational History  . self employed    Social History Main Topics  . Smoking status: Current Every Day Smoker    Packs/day: 1.50    Years: 33.00    Types: Cigarettes  . Smokeless tobacco: Never Used  . Alcohol use 0.0 oz/week     Comment: rarely  . Drug use: No  . Sexual activity: Not Asked   Other Topics Concern  . 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  Genitourinary: Positive for dysuria, frequency and urgency. Negative for flank pain.   Objective:  BP (!) 143/69 (BP Location: Left Arm, Patient Position: Sitting, Cuff Size: Normal)   Pulse 100   Temp 98.2 F (36.8 C) (Oral)   Resp 14   Wt 233 lb 6 oz (105.9 kg)   SpO2 97%   BMI 33.01 kg/m   BP/Weight 11/01/2016 03/19/2015 [id number]  Systolic BP A999333 0000000 A999333  Diastolic BP 69 90 86  Wt. (Lbs) 233.38 233.5 250  BMI 33.01 33.02 33.9   Physical Exam  Constitutional: He is oriented to person, place, and time. He  appears well-developed. No distress.  Cardiovascular: Regular rhythm.   Pulmonary/Chest: Effort normal and breath sounds normal.  Abdominal: Soft. He exhibits no distension. There is no tenderness. There is no rebound and no guarding.  Neurological: He is alert and oriented to person, place, and time.  Psychiatric: He has a normal mood and affect.  Vitals reviewed.  Lab Results  Component Value Date   WBC 9.7 05/29/2008   HGB 16.2 06/02/2012   HCT 45.3 05/29/2008   PLT 294 05/29/2008   GLUCOSE 192 (H) 03/13/2015   CHOL 134 03/13/2015   TRIG 91.0 03/13/2015   HDL 35.10 (L) 03/13/2015   LDLCALC 81 03/13/2015   ALT 27 03/13/2015   AST 17 03/13/2015   NA 138 03/13/2015   K 4.6 03/13/2015   CL 103 03/13/2015   CREATININE 0.74 03/13/2015   BUN 14 03/13/2015   CO2 29 03/13/2015   TSH 0.71 06/02/2012   PSA 0.38 06/02/2012   HGBA1C 9.0 (H) 03/13/2015   MICROALBUR 1.4 03/13/2015   Results for orders placed or performed in visit on 11/01/16 (from the past 24 hour(s))  POCT Urinalysis Dipstick     Status: Abnormal   Collection Time: 11/01/16 10:34 AM  Result Value Ref Range   Color,  UA orange    Clarity, UA clear    Glucose, UA positive    Bilirubin, UA neg    Ketones, UA positive    Spec Grav, UA 1.025    Blood, UA neg    pH, UA 6.0    Protein, UA positive    Urobilinogen, UA 1.0    Nitrite, UA pos    Leukocytes, UA moderate (2+) (A) Negative    Assessment & Plan:   Problem List Items Addressed This Visit    Acute cystitis without hematuria - Primary    New acute problem. + symptoms and urinalysis with moderate leukocytes, positive nitrite. Unclear reason for infection. Has risk factor - Diabetes and likely underlying BPH. No recent instrumentation/procedure.  Treating with cipro. If fails to improve will need prostate exam as well as possible urology referral. Sending for culture.      Relevant Orders   POCT Urinalysis Dipstick (Completed)   Urine Culture      Meds ordered this encounter  Medications  . ciprofloxacin (CIPRO) 500 MG tablet    Sig: Take 1 tablet (500 mg total) by mouth 2 (two) times daily.    Dispense:  14 tablet    Refill:  0    Follow-up: PRN  [place]

## 2016-11-01 NOTE — Patient Instructions (Signed)
Take the antibiotic as prescribed.  Please follow up closely with your primary.  Take care  Dr. Lacinda Axon

## 2016-11-01 NOTE — Progress Notes (Signed)
Pre visit review using our clinic review tool, if applicable. No additional management support is needed unless otherwise documented below in the visit note. 

## 2016-11-03 LAB — URINE CULTURE

## 2016-11-05 ENCOUNTER — Telehealth: Payer: Self-pay | Admitting: Family Medicine

## 2016-11-05 NOTE — Telephone Encounter (Signed)
Patient advised of labs 11/01/16 .

## 2016-11-05 NOTE — Telephone Encounter (Signed)
Please call pt at [phone] in reference to a lab result.

## 2016-11-05 NOTE — Telephone Encounter (Signed)
Pt called for lab results. Please call him at [phone].

## 2016-11-16 ENCOUNTER — Encounter: Payer: Self-pay | Admitting: Family Medicine

## 2016-11-16 ENCOUNTER — Ambulatory Visit (INDEPENDENT_AMBULATORY_CARE_PROVIDER_SITE_OTHER): Payer: BLUE CROSS/BLUE SHIELD | Admitting: Family Medicine

## 2016-11-16 VITALS — BP 132/80 | HR 99 | Temp 97.9°F | Wt 234.0 lb

## 2016-11-16 DIAGNOSIS — Z119 Encounter for screening for infectious and parasitic diseases, unspecified: Secondary | ICD-10-CM | POA: Diagnosis not present

## 2016-11-16 DIAGNOSIS — E119 Type 2 diabetes mellitus without complications: Secondary | ICD-10-CM | POA: Diagnosis not present

## 2016-11-16 DIAGNOSIS — Z23 Encounter for immunization: Secondary | ICD-10-CM

## 2016-11-16 DIAGNOSIS — N3000 Acute cystitis without hematuria: Secondary | ICD-10-CM | POA: Diagnosis not present

## 2016-11-16 LAB — COMPREHENSIVE METABOLIC PANEL
ALT: 23 U/L (ref 0–53)
AST: 15 U/L (ref 0–37)
Albumin: 4.3 g/dL (ref 3.5–5.2)
Alkaline Phosphatase: 83 U/L (ref 39–117)
BILIRUBIN TOTAL: 0.4 mg/dL (ref 0.2–1.2)
BUN: 12 mg/dL (ref 6–23)
CO2: 28 meq/L (ref 19–32)
Calcium: 9.5 mg/dL (ref 8.4–10.5)
Chloride: 100 mEq/L (ref 96–112)
Creatinine, Ser: 0.74 mg/dL (ref 0.40–1.50)
GFR: 114.45 mL/min (ref >60.00)
GLUCOSE: 273 mg/dL — AB (ref 70–99)
Potassium: 4.2 mEq/L (ref 3.5–5.1)
Sodium: 137 mEq/L (ref 135–145)
Total Protein: 7 g/dL (ref 6.0–8.3)

## 2016-11-16 LAB — MICROALBUMIN / CREATININE URINE RATIO
Creatinine,U: 81.7 mg/dL
MICROALB/CREAT RATIO: 1.3 mg/g (ref 0.0–30.0)
Microalb, Ur: 1.1 mg/dL (ref 0.0–1.9)

## 2016-11-16 LAB — LIPID PANEL
CHOL/HDL RATIO: 3
Cholesterol: 153 mg/dL (ref 0–200)
HDL: 43.9 mg/dL (ref >39.00)
LDL Cholesterol: 86 mg/dL (ref 0–99)
NONHDL: 108.61
Triglycerides: 112 mg/dL (ref 0.0–149.0)
VLDL: 22.4 mg/dL (ref 0.0–40.0)

## 2016-11-16 LAB — HEMOGLOBIN A1C: Hgb A1c MFr Bld: 9.9 % — ABNORMAL HIGH (ref 4.6–6.5)

## 2016-11-16 NOTE — Patient Instructions (Addendum)
Go to the lab on the way out.  We'll contact you with your lab report. Take care.  Glad to see you.  Update me as needed.   

## 2016-11-16 NOTE — Progress Notes (Signed)
Pre visit review using our clinic review tool, if applicable. No additional management support is needed unless otherwise documented below in the visit note. 

## 2016-11-16 NOTE — Progress Notes (Signed)
Diabetes:  No meds Hypoglycemic episodes: no Hyperglycemic episodes:no Feet problems: no Blood Sugars averaging: ~130-140 in the AMs.   eye exam within last year: due, d/w pt.   Due for labs.  Fasting.   See notes on labs.   Diet d/w pt.    Pt opts in for HCV screening.  D/w pt re: routine screening.    D/w patient [id number] for colon cancer screening, including IFOB vs. colonoscopy.  Risks and benefits of both were discussed and patient voiced understanding.  Pt elects [id number].   Flu shot today.  D/ wpt.    F/u for UTI sx.  Clearly had burning with urination.  S/p cipro course, prev ucx d/w pt.  He got better about 3-4 days after starting med but was able to finish abx course.  He is urinating much better now.  No pain now.  D/w pt about not checking a PSA at this point with recent UTI hx noted.  He agreed.  No other h/o UTI.  No FCNAVD.  He has no LUTS currently.    PMH and SH reviewed  Meds, vitals, and allergies reviewed.   ROS: Per HPI unless specifically indicated in ROS section   GEN: nad, alert and oriented HEENT: mucous membranes moist NECK: supple w/o LA CV: rrr. PULM: ctab, no inc wob ABD: soft, +bs EXT: no edema  Diabetic foot exam: Normal inspection No skin breakdown No calluses  Normal DP pulses Normal sensation to light touch and monofilament Nails normal except for L 1st nail thickened.

## 2016-11-17 LAB — HEPATITIS C ANTIBODY: HCV AB: NEGATIVE

## 2016-11-18 ENCOUNTER — Encounter: Payer: Self-pay | Admitting: Family Medicine

## 2016-11-18 NOTE — Assessment & Plan Note (Addendum)
D/w pt and his sugar, diet and weight.  See notes on labs.  >25 minutes spent in face to face time with patient, >50% spent in counselling or coordination of care.

## 2016-11-18 NOTE — Assessment & Plan Note (Signed)
Pt opts in for HCV screening.  D/w pt re: routine screening.

## 2016-11-18 NOTE — Assessment & Plan Note (Signed)
Resolved now.  He got better about 3-4 days after starting med but was able to finish abx course.  He is urinating much better now.  No pain now.  D/w pt about not checking a PSA at this point with recent UTI hx noted.  He agreed.  No other h/o UTI.  No FCNAVD.  He has no LUTS currently.

## 2016-11-23 ENCOUNTER — Encounter: Payer: Self-pay | Admitting: *Deleted

## 2018-01-19 ENCOUNTER — Ambulatory Visit: Payer: BLUE CROSS/BLUE SHIELD | Admitting: Family Medicine

## 2018-01-19 ENCOUNTER — Encounter: Payer: Self-pay | Admitting: Family Medicine

## 2018-01-19 VITALS — BP 154/92 | HR 85 | Temp 99.9°F | Wt 223.5 lb

## 2018-01-19 DIAGNOSIS — E1129 Type 2 diabetes mellitus with other diabetic kidney complication: Secondary | ICD-10-CM

## 2018-01-19 DIAGNOSIS — R809 Proteinuria, unspecified: Secondary | ICD-10-CM | POA: Diagnosis not present

## 2018-01-19 DIAGNOSIS — E119 Type 2 diabetes mellitus without complications: Secondary | ICD-10-CM

## 2018-01-19 LAB — CBC WITH DIFFERENTIAL/PLATELET
BASOS PCT: 0.8 % (ref 0.0–3.0)
Basophils Absolute: 0.1 10*3/uL (ref 0.0–0.1)
EOS PCT: 2.9 % (ref 0.0–5.0)
Eosinophils Absolute: 0.3 10*3/uL (ref 0.0–0.7)
HCT: 47 % (ref 39.0–52.0)
HEMOGLOBIN: 15.9 g/dL (ref 13.0–17.0)
LYMPHS ABS: 3.4 10*3/uL (ref 0.7–4.0)
Lymphocytes Relative: 32.8 % (ref 12.0–46.0)
MCHC: 33.8 g/dL (ref 30.0–36.0)
MCV: 86 fl (ref 78.0–100.0)
MONO ABS: 0.7 10*3/uL (ref 0.1–1.0)
Monocytes Relative: 6.5 % (ref 3.0–12.0)
NEUTROS PCT: 57 % (ref 43.0–77.0)
Neutro Abs: 5.9 10*3/uL (ref 1.4–7.7)
Platelets: 223 10*3/uL (ref 150.0–400.0)
RBC: 5.47 Mil/uL (ref 4.22–5.81)
RDW: 14.1 % (ref 11.5–15.5)
WBC: 10.4 10*3/uL (ref 4.0–10.5)

## 2018-01-19 LAB — COMPREHENSIVE METABOLIC PANEL
ALT: 24 U/L (ref 0–53)
AST: 17 U/L (ref 0–37)
Albumin: 3.9 g/dL (ref 3.5–5.2)
Alkaline Phosphatase: 55 U/L (ref 39–117)
BUN: 13 mg/dL (ref 6–23)
CO2: 30 mEq/L (ref 19–32)
Calcium: 9.8 mg/dL (ref 8.4–10.5)
Chloride: 100 mEq/L (ref 96–112)
Creatinine, Ser: 0.76 mg/dL (ref 0.40–1.50)
GFR: 110.55 mL/min (ref >60.00)
GLUCOSE: 250 mg/dL — AB (ref 70–99)
POTASSIUM: 4 meq/L (ref 3.5–5.1)
SODIUM: 136 meq/L (ref 135–145)
Total Bilirubin: 0.8 mg/dL (ref 0.2–1.2)
Total Protein: 7 g/dL (ref 6.0–8.3)

## 2018-01-19 LAB — MICROALBUMIN / CREATININE URINE RATIO
Creatinine,U: 167.3 mg/dL
MICROALB/CREAT RATIO: 2.6 mg/g (ref 0.0–30.0)
Microalb, Ur: 4.3 mg/dL — ABNORMAL HIGH (ref 0.0–1.9)

## 2018-01-19 LAB — HEMOGLOBIN A1C: Hgb A1c MFr Bld: 10.9 % — ABNORMAL HIGH (ref 4.6–6.5)

## 2018-01-19 LAB — TSH: TSH: 0.7 u[IU]/mL (ref 0.35–4.50)

## 2018-01-19 NOTE — Progress Notes (Signed)
He had a fall on the ice, back in the winter.  Still with L leg pain but getting better, with some pain along the L lateral shin and L lower back.  He is clearly better in the meantime.  He is using an inversion table and that helps.  Less pain with getting up and moving, more pain when sedentary.  He occ took some ibuprofen.    Runny stools for 1-2 years, more fatigue in the last few months.  No known fevers, temp 99.9 today incidentally noted today.  No blood in stool.  Some urinary frequency, some nocturia.  DM2.  Due for f/u labs.  He had not been back after last OV, > 1 year ago.  Hasn't checked sugar recently.  Mouth mildly dry.    Meds, vitals, and allergies reviewed.   ROS: Per HPI unless specifically indicated in ROS section   GEN: nad, alert and oriented HEENT: mucous membranes mildly dry.   NECK: supple w/o LA CV: rrr PULM: ctab, no inc wob ABD: soft, +bs EXT: no edema SKIN: no acute rash

## 2018-01-19 NOTE — Patient Instructions (Signed)
Drink enough water to keep your urine clear.  Go to the lab on the way out.  We'll contact you with your lab report. Take care.  Glad to see you.

## 2018-01-20 NOTE — Assessment & Plan Note (Signed)
See notes on labs.  Diabetes is likely causing his symptoms.  This is the same general advice we gave him at the end of 2017.  He has DM and it is clearly worse from the last check. A1c ~11, which is awful/not controlled. He needs medical treatment.  I highly advise him to come and discuss and start meds while working on diet and exercise to try to prevent the likely outcomes from persistently uncontrolled diabetes including death, amputation, stroke, heart attack, etc.  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  Needs recheck A1c in 3 months with some type of intervention in the meantime.  If he isn't going to follow up, then I wish him the best and he can find a different clinic.

## 2018-01-26 ENCOUNTER — Encounter: Payer: Self-pay | Admitting: Family Medicine

## 2018-01-26 ENCOUNTER — Ambulatory Visit: Payer: BLUE CROSS/BLUE SHIELD | Admitting: Family Medicine

## 2018-01-26 VITALS — BP 164/84 | HR 94 | Temp 97.7°F | Wt 227.5 lb

## 2018-01-26 DIAGNOSIS — R809 Proteinuria, unspecified: Secondary | ICD-10-CM | POA: Diagnosis not present

## 2018-01-26 DIAGNOSIS — E1129 Type 2 diabetes mellitus with other diabetic kidney complication: Secondary | ICD-10-CM | POA: Diagnosis not present

## 2018-01-26 DIAGNOSIS — Z23 Encounter for immunization: Secondary | ICD-10-CM

## 2018-01-26 MED ORDER — INSULIN PEN NEEDLE 31G X 5 MM MISC: 3 refills | Status: DC

## 2018-01-26 MED ORDER — INSULIN GLARGINE 100 UNIT/ML SOLOSTAR PEN: 5.0000 [IU] | PEN_INJECTOR | Freq: Every day | SUBCUTANEOUS | 99 refills | Status: DC

## 2018-01-26 NOTE — Progress Notes (Signed)
DM2.  He was okay with insulin start.  Usually ~200 in the AM, now that he is back on diet.   D/w pt about labs.   Microalbumin is positive.  There is no expectation for his situation to improve without routine follow-up and adherence to instructions.  I wish him the best and I told him if he is not going to follow-up then there is not much point in expecting his situation to improve.  He understood all this.  He is more motivated now.  We talked about options.  We talked about medication treatment.  He did not tolerate metformin well before.  He is likely going to need insulin start.  This would likely be the safest and best way to get his sugar under control in the relatively near future.  He will still need other health maintenance items addressed but this would be a place to start.  Meds, vitals, and allergies reviewed.   ROS: Per HPI unless specifically indicated in ROS section   GEN: nad, alert and oriented HEENT: mucous membranes moist NECK: supple w/o LA CV: rrr. PULM: ctab, no inc wob ABD: soft, +bs EXT: no edema

## 2018-01-26 NOTE — Patient Instructions (Addendum)
Get the pens and needles and then come in for a visit for your first shot.  We'll go from there.  Call about an eye exam.  We'll see about treatment for your microalbumin later on.   Flu shot today.  Take care.  Glad to see you.

## 2018-01-27 ENCOUNTER — Ambulatory Visit: Payer: BLUE CROSS/BLUE SHIELD | Admitting: Family Medicine

## 2018-01-27 ENCOUNTER — Encounter: Payer: Self-pay | Admitting: Family Medicine

## 2018-01-27 DIAGNOSIS — E1129 Type 2 diabetes mellitus with other diabetic kidney complication: Secondary | ICD-10-CM

## 2018-01-27 DIAGNOSIS — R809 Proteinuria, unspecified: Secondary | ICD-10-CM | POA: Diagnosis not present

## 2018-01-27 NOTE — Progress Notes (Signed)
Sugar was 230 this afternoon, 185 this AM.   D/w pt about insulin use, see plan.    Meds, vitals, and allergies reviewed.   ROS: Per HPI unless specifically indicated in ROS section   nad Exam deferred o/w

## 2018-01-27 NOTE — Patient Instructions (Signed)
5 units today.  If your AM sugar is above 140, then add 1 unit to the next dose.  If your AM sugar is below 100, then take away 1 unit from the next dose.  If your AM sugar is 100-140, then no change in the next dose.  Write your dose on the calendar.   Update me in about 10 days, sooner if needed.    Use only 1 pen.  Keep it at room temperature.  Other pens can stay in the fridge.

## 2018-01-29 NOTE — Assessment & Plan Note (Signed)
Routine insulin teaching discussed and demonstrated for the patient.  He brought in his insulin pen and needles.  We went through the routine appropriate use of insulin pens.  I gave him a handout with written instructions.  We talked about all of that.  He was able to successfully put a sterile needle on the pen and give a dose of 5 units into the skin.  All of this was done with routine aseptic technique.  I coached him through the process.  He was able to make sure that the needle was functional by testing it with 2 units, per routine.  No complications.  He understood the process.  He should be able to do this at home without difficulty.  He will adjust his insulin over the next few days and then update me. If AM sugar is above 140, then add 1 unit to the next dose.  If AM sugar is below 100, then take away 1 unit from the next dose.  If AM sugar is 100-140, then no change in the next dose.  Continue work on diet.  Drink plenty fluids.  He agrees with plan.  See after visit summary.  >15 minutes spent in face to face time with patient, >50% spent in counselling or coordination of care.

## 2018-01-29 NOTE — Assessment & Plan Note (Signed)
He'll get the insulin pens and needles and then come in for a visit for his first shot.  We'll go from there.  Advised pt to call about an eye exam.  We'll see about treatment for microalbumin later on.   Flu shot today.  He agrees with plan.

## 2018-01-30 ENCOUNTER — Ambulatory Visit: Payer: BLUE CROSS/BLUE SHIELD | Admitting: Family Medicine

## 2018-02-14 ENCOUNTER — Ambulatory Visit: Payer: BLUE CROSS/BLUE SHIELD | Admitting: Family Medicine

## 2018-02-14 ENCOUNTER — Encounter: Payer: Self-pay | Admitting: Family Medicine

## 2018-02-14 DIAGNOSIS — L02211 Cutaneous abscess of abdominal wall: Secondary | ICD-10-CM

## 2018-02-14 MED ORDER — DOXYCYCLINE HYCLATE 100 MG PO TABS: 100.0000 mg | ORAL_TABLET | Freq: Two times a day (BID) | ORAL | 0 refills | Status: DC

## 2018-02-14 NOTE — Progress Notes (Signed)
He has been working on diet and taking 16 units of insulin and his sugar has been ~120.  He feels better in the meantime.  Urination and thirst are better.  He isn't having foot troubles.    Knot at umbilicus.  Noted a lump.  Started draining yesterday.  No fevers.  No vomiting.  No vomiting, no diarrhea.  Is locally sore.  It was clearly much redder and larger before it started draining.  Per patient it has clearly improved in the meantime.  Meds, vitals, and allergies reviewed.   ROS: Per HPI unless specifically indicated in ROS section   nad ncat Mmm Neck supple, no LA rrr ctab Abdomen soft, nontender, normal bowel sounds.  He has local skin irritation and mild tenderness with some drainage at the umbilicus with some local swelling in the skin.  This appears to be limited to the skin and not deep into the abdomen.  He does have some thin, scant discharge noted.

## 2018-02-14 NOTE — Patient Instructions (Addendum)
Start doxy, use warm compresses and update me if this doesn't totally resolve.  Recheck labs at the end of May or early June prior to a visit.  Thanks for your effort.  Take care.  Glad to see you.

## 2018-02-15 DIAGNOSIS — L02211 Cutaneous abscess of abdominal wall: Secondary | ICD-10-CM | POA: Insufficient documentation

## 2018-02-15 NOTE — Assessment & Plan Note (Signed)
He is clearly improved with spontaneous rupture and drainage.  The redness has gone down dramatically.  The size of the lesion has gone down dramatically.  He is not febrile.  It all appears to be limited to the abdominal wall.  Reasonable to start doxycycline with routine cautions.  I would not expect this to need incision and drainage since it is already draining now.  Continue with warm compresses.  Encouraged him to continue to keep his diabetes under control.  Update me as needed.  He agrees.  See after visit summary.

## 2018-04-04 ENCOUNTER — Telehealth: Payer: Self-pay

## 2018-04-04 DIAGNOSIS — R809 Proteinuria, unspecified: Principal | ICD-10-CM

## 2018-04-04 DIAGNOSIS — E1129 Type 2 diabetes mellitus with other diabetic kidney complication: Secondary | ICD-10-CM

## 2018-04-04 NOTE — Telephone Encounter (Signed)
Copied from [place] #[id number]. Topic: General - Other >> Apr 04, 2018 11:05 AM Carolyn Stare wrote:  Pt has an appt on 04/20/18 and is asking if he need a lab

## 2018-04-04 NOTE — Telephone Encounter (Signed)
Pt has appt to see Dr Damita Dunnings on 04/20/18 at 9:30 AM; per 02/14/18 AVS pt is to reck labs end of May or early June prior to a visit. Do you want pt to have A1C or what labs do you want pt to have?

## 2018-04-05 NOTE — Telephone Encounter (Signed)
Ordered lipid and A1c.  Thanks.

## 2018-04-05 NOTE — Telephone Encounter (Signed)
Patient notified as instructed by telephone and lab appointment scheduled.

## 2018-04-18 ENCOUNTER — Other Ambulatory Visit (INDEPENDENT_AMBULATORY_CARE_PROVIDER_SITE_OTHER): Payer: BLUE CROSS/BLUE SHIELD

## 2018-04-18 DIAGNOSIS — E1129 Type 2 diabetes mellitus with other diabetic kidney complication: Secondary | ICD-10-CM

## 2018-04-18 DIAGNOSIS — R809 Proteinuria, unspecified: Secondary | ICD-10-CM | POA: Diagnosis not present

## 2018-04-18 LAB — LIPID PANEL
CHOL/HDL RATIO: 3
Cholesterol: 131 mg/dL (ref 0–200)
HDL: 43.3 mg/dL (ref >39.00)
LDL Cholesterol: 77 mg/dL (ref 0–99)
NONHDL: 87.98
Triglycerides: 53 mg/dL (ref 0.0–149.0)
VLDL: 10.6 mg/dL (ref 0.0–40.0)

## 2018-04-18 LAB — HEMOGLOBIN A1C: Hgb A1c MFr Bld: 7 % — ABNORMAL HIGH (ref 4.6–6.5)

## 2018-04-20 ENCOUNTER — Encounter: Payer: Self-pay | Admitting: Family Medicine

## 2018-04-20 ENCOUNTER — Ambulatory Visit: Payer: BLUE CROSS/BLUE SHIELD | Admitting: Family Medicine

## 2018-04-20 VITALS — BP 156/90 | HR 75 | Temp 97.6°F | Ht 70.5 in | Wt 216.2 lb

## 2018-04-20 DIAGNOSIS — R809 Proteinuria, unspecified: Secondary | ICD-10-CM

## 2018-04-20 DIAGNOSIS — E1129 Type 2 diabetes mellitus with other diabetic kidney complication: Secondary | ICD-10-CM

## 2018-04-20 MED ORDER — NICOTINE 21 MG/24HR TD PT24: 21.0000 mg | MEDICATED_PATCH | Freq: Every day | TRANSDERMAL | 1 refills | Status: DC

## 2018-04-20 NOTE — Assessment & Plan Note (Signed)
A1c clearly better.  D/w pt about diet, med use, labs.  Goal to address DM2/risk factors gradually.  Would recheck MALB with next set of labs since A1c much better.  Smoking cessation likely more important than statin at this point.  Counseling given, discussed options.  He'll price check nicotine patch, routine cautions given.  Recheck in about 3 months.  He'll call about eye exam.  He'll update me as needed.  I thanked him for his effort.

## 2018-04-20 NOTE — Progress Notes (Signed)
Diabetes:  Using medications without difficulties: usually ~18 units per day, compliant.   Hypoglycemic episodes: no Hyperglycemic episodes: not now, d/w pt.   Feet problems:no Blood Sugars averaging: ~120 in the AM eye exam within last year: due A1c much improved, d/w pt.   Lipids d/w pt.    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  He is considering quitting.  He is trying to taper some.  D/w pt about options.  See AVS.    Meds, vitals, and allergies reviewed.   ROS: Per HPI unless specifically indicated in ROS section   GEN: nad, alert and oriented HEENT: mucous membranes moist NECK: supple w/o LA CV: rrr. PULM: ctab, no inc wob ABD: soft, +bs EXT: no edema SKIN: well perfused.    Diabetic foot exam: Normal inspection No skin breakdown No calluses except for small callus on the posterior R heel Normal DP pulses Normal sensation to light touch and monofilament Nails normal

## 2018-04-20 NOTE — Patient Instructions (Addendum)
Use the 21mg  patch for 1-2 months, then taper down to 14 then 7 mg.  Don't smoke with the patch.  Let me know if you need another rx.  Recheck in about 3 months, labs at the visit.  You don't have to fast.   Take care.  Glad to see you.  Thanks for your effort.  Call about an eye exam.

## 2018-09-11 ENCOUNTER — Encounter: Payer: Self-pay | Admitting: Family Medicine

## 2018-09-11 ENCOUNTER — Telehealth: Payer: Self-pay | Admitting: *Deleted

## 2018-09-11 ENCOUNTER — Ambulatory Visit: Payer: BLUE CROSS/BLUE SHIELD | Admitting: Family Medicine

## 2018-09-11 VITALS — BP 168/80 | HR 88 | Temp 98.0°F | Ht 70.5 in | Wt 218.0 lb

## 2018-09-11 DIAGNOSIS — E119 Type 2 diabetes mellitus without complications: Secondary | ICD-10-CM | POA: Diagnosis not present

## 2018-09-11 DIAGNOSIS — R1030 Lower abdominal pain, unspecified: Secondary | ICD-10-CM

## 2018-09-11 DIAGNOSIS — Z23 Encounter for immunization: Secondary | ICD-10-CM | POA: Diagnosis not present

## 2018-09-11 DIAGNOSIS — E1129 Type 2 diabetes mellitus with other diabetic kidney complication: Secondary | ICD-10-CM | POA: Diagnosis not present

## 2018-09-11 DIAGNOSIS — R809 Proteinuria, unspecified: Secondary | ICD-10-CM | POA: Diagnosis not present

## 2018-09-11 LAB — POCT GLYCOSYLATED HEMOGLOBIN (HGB A1C): Hemoglobin A1C: 6.7 % — AB (ref 4.0–5.6)

## 2018-09-11 NOTE — Progress Notes (Signed)
Diabetes:  Using medications without difficulties: yes, taking ~18 units a day Hypoglycemic episodes:no Hyperglycemic episodes:no Feet problems:some occ tingling in the toes at night Blood Sugars averaging: 130-80 eye exam within last year:  Due, d/w pt.   A1c improved, down to 6.7.   Hypoglycemia cautions d/w pt.    He has B groin pain, not on waking but during/after prolonged driving in the AMs.  Noted in the last few weeks.  No mass felt.  He has some loose stools, runny.  No fevers.  No blood in urine.  No burning with urination.  He feels better up and walking around.    His sister in law died recently from sepsis.  Condolences offered.    Meds, vitals, and allergies reviewed.   ROS: Per HPI unless specifically indicated in ROS section   GEN: nad, alert and oriented HEENT: mucous membranes moist NECK: supple w/o LA CV: rrr. PULM: ctab, no inc wob ABD: soft, +bs EXT: no edema SKIN: no acute rash No masses or hernia felt, testicles nontender.  Normal external genitalia.  Suprapubic area not tender.

## 2018-09-11 NOTE — Telephone Encounter (Signed)
Patient says his wife is looking to see a MD here at [facility] and asks if you would take her as a patient?  She saw Dr. Deborra Medina here and has seen her a few times since she moved offices but says it is a long way out there to her new location and she would like to see someone here.  Please advise.

## 2018-09-11 NOTE — Patient Instructions (Addendum)
Recheck in about 3-4 months.  A1c at the visit.  Thanks for your effort.    The groin pain is likely positional/mechanical.  If it continues then let me know.    Call about an eye exam when possible.   Take care.  Glad to see you.

## 2018-09-12 DIAGNOSIS — R1030 Lower abdominal pain, unspecified: Secondary | ICD-10-CM | POA: Insufficient documentation

## 2018-09-12 NOTE — Assessment & Plan Note (Signed)
A1c improved to 6.7.  Continue insulin as is.  If he has any hypoglycemia then he can cut back on his insulin by 1 unit/day.  Recheck in a few months.  See after visit summary.  Update me as needed in the meantime.  Hypoglycemia cautions discussed with patient.

## 2018-09-12 NOTE — Telephone Encounter (Signed)
Stephen Shelton was advised and also was told that a new MD is coming into our practice soon and his wife could perhaps get in with her if she calls the office.  He states he will get her to do that.

## 2018-09-12 NOTE — Telephone Encounter (Signed)
I take this as a compliment but given my current patient load, I can't take on new patients right now.

## 2018-09-12 NOTE — Assessment & Plan Note (Signed)
This is likely a mechanical issue with prolonged sitting and driving in the mornings.  No mass or hernia felt.  Discussed with patient.  If he continues to have symptoms then he will let me know.

## 2018-12-25 ENCOUNTER — Encounter: Payer: Self-pay | Admitting: Family Medicine

## 2018-12-25 ENCOUNTER — Ambulatory Visit: Payer: BLUE CROSS/BLUE SHIELD | Admitting: Family Medicine

## 2018-12-25 VITALS — BP 146/80 | HR 93 | Temp 98.3°F | Ht 70.5 in

## 2018-12-25 DIAGNOSIS — F17200 Nicotine dependence, unspecified, uncomplicated: Secondary | ICD-10-CM | POA: Diagnosis not present

## 2018-12-25 DIAGNOSIS — R809 Proteinuria, unspecified: Secondary | ICD-10-CM

## 2018-12-25 DIAGNOSIS — E1129 Type 2 diabetes mellitus with other diabetic kidney complication: Secondary | ICD-10-CM

## 2018-12-25 DIAGNOSIS — E119 Type 2 diabetes mellitus without complications: Secondary | ICD-10-CM | POA: Diagnosis not present

## 2018-12-25 LAB — POCT GLYCOSYLATED HEMOGLOBIN (HGB A1C): Hemoglobin A1C: 6.5 % — AB (ref 4.0–5.6)

## 2018-12-25 LAB — MICROALBUMIN / CREATININE URINE RATIO
Creatinine,U: 80.9 mg/dL
Microalb Creat Ratio: 2.1 mg/g (ref 0.0–30.0)
Microalb, Ur: 1.7 mg/dL (ref 0.0–1.9)

## 2018-12-25 MED ORDER — VARENICLINE TARTRATE 1 MG PO TABS: 1.0000 mg | ORAL_TABLET | Freq: Two times a day (BID) | ORAL | 1 refills | Status: DC

## 2018-12-25 MED ORDER — INSULIN GLARGINE 100 UNIT/ML SOLOSTAR PEN: 10.0000 [IU] | PEN_INJECTOR | Freq: Every day | SUBCUTANEOUS | 99 refills | Status: DC

## 2018-12-25 MED ORDER — VARENICLINE TARTRATE 0.5 MG X 11 & 1 MG X 42 PO MISC: ORAL | 0 refills | Status: DC

## 2018-12-25 NOTE — Patient Instructions (Addendum)
Go to the lab on the way out.  We'll contact you with your lab report. Thank your for your effort.  If you have any low sugars (below 80 at any point or consistently below 100 in the mornings), then cut back by 1 unit of insulin.  Keep cutting back if needed, 1 unit per day.    Recheck with labs prior to a physical in about 4 months.  Take care.  Glad to see you.   Update me if you have any troubles on chantix.

## 2018-12-25 NOTE — Progress Notes (Signed)
Diabetes:  Using medications without difficulties: yes, 18-20 units.   Hypoglycemic episodes: no Hyperglycemic episodes:no Feet problems:no Blood Sugars averaging: 110-130 eye exam within last year: done last month. No retinopathy per patient report.  A1c d/w pt.  Lower, down to 6.5.    His daughter in law is in labor in [place] with birth pending.    D/w pt about chantix.  Mood is good. No SI/HI.  He failed treatment with nicotine replacement.    Meds, vitals, and allergies reviewed.   ROS: Per HPI unless specifically indicated in ROS section   GEN: nad, alert and oriented HEENT: mucous membranes moist NECK: supple w/o LA CV: rrr. PULM: ctab, no inc wob ABD: soft, +bs EXT: no edema SKIN: well perfused.

## 2018-12-27 NOTE — Assessment & Plan Note (Signed)
D/w pt about chantix.  Mood is good. No SI/HI.  He failed treatment with nicotine replacement.  Reasonable to try Chantix with routine cautions.  He agrees.  We talked about typical instructions in use and cautions.  We talked about potential mood changes and also sleep changes.  Update me as needed.

## 2018-12-27 NOTE — Assessment & Plan Note (Signed)
See notes on labs.  A1c lower down to 6.5.  Recheck in a few months.  See after visit summary.  If he has any trouble with lows then he can taper his insulin as needed.  He agrees with plan.  I thanked him for his effort.

## 2018-12-28 ENCOUNTER — Encounter: Payer: Self-pay | Admitting: *Deleted

## 2019-01-19 ENCOUNTER — Other Ambulatory Visit: Payer: Self-pay | Admitting: Family Medicine

## 2019-01-21 NOTE — Telephone Encounter (Signed)
I sent this to make sure it had gone through, but it may be a duplicate/auto refill issue.  Thanks.

## 2019-01-26 ENCOUNTER — Other Ambulatory Visit: Payer: Self-pay | Admitting: Family Medicine

## 2019-02-16 ENCOUNTER — Other Ambulatory Visit: Payer: Self-pay | Admitting: Family Medicine

## 2019-02-16 NOTE — Telephone Encounter (Signed)
This may have been an auto request, but I didn't want patient to run out so I sent it.  Thanks.

## 2019-02-16 NOTE — Telephone Encounter (Signed)
Electronic refill request. Chantix Last office visit:   12/25/2018 Last Filled:    60 tablet 1 01/21/2019  Please advise.

## 2019-03-10 ENCOUNTER — Other Ambulatory Visit: Payer: Self-pay | Admitting: Family Medicine

## 2019-03-29 ENCOUNTER — Other Ambulatory Visit: Payer: Self-pay | Admitting: Family Medicine

## 2019-03-30 ENCOUNTER — Other Ambulatory Visit: Payer: Self-pay | Admitting: Family Medicine

## 2019-05-09 ENCOUNTER — Other Ambulatory Visit: Payer: Self-pay | Admitting: Family Medicine

## 2019-05-09 ENCOUNTER — Other Ambulatory Visit (INDEPENDENT_AMBULATORY_CARE_PROVIDER_SITE_OTHER): Payer: BC Managed Care – PPO

## 2019-05-09 ENCOUNTER — Other Ambulatory Visit: Payer: Self-pay

## 2019-05-09 DIAGNOSIS — E119 Type 2 diabetes mellitus without complications: Secondary | ICD-10-CM

## 2019-05-09 LAB — COMPREHENSIVE METABOLIC PANEL
ALT: 17 U/L (ref 0–53)
AST: 12 U/L (ref 0–37)
Albumin: 4.3 g/dL (ref 3.5–5.2)
Alkaline Phosphatase: 60 U/L (ref 39–117)
BUN: 13 mg/dL (ref 6–23)
CO2: 30 mEq/L (ref 19–32)
Calcium: 9.4 mg/dL (ref 8.4–10.5)
Chloride: 104 mEq/L (ref 96–112)
Creatinine, Ser: 0.7 mg/dL (ref 0.40–1.50)
GFR: 113.89 mL/min (ref >60.00)
Glucose, Bld: 123 mg/dL — ABNORMAL HIGH (ref 70–99)
Potassium: 4.3 mEq/L (ref 3.5–5.1)
Sodium: 140 mEq/L (ref 135–145)
Total Bilirubin: 0.6 mg/dL (ref 0.2–1.2)
Total Protein: 6.5 g/dL (ref 6.0–8.3)

## 2019-05-09 LAB — LIPID PANEL
Cholesterol: 147 mg/dL (ref 0–200)
HDL: 40.4 mg/dL (ref >39.00)
LDL Cholesterol: 91 mg/dL (ref 0–99)
NonHDL: 106.91
Total CHOL/HDL Ratio: 4
Triglycerides: 79 mg/dL (ref 0.0–149.0)
VLDL: 15.8 mg/dL (ref 0.0–40.0)

## 2019-05-09 LAB — HEMOGLOBIN A1C: Hgb A1c MFr Bld: 7.1 % — ABNORMAL HIGH (ref 4.6–6.5)

## 2019-05-11 ENCOUNTER — Encounter: Payer: Self-pay | Admitting: Family Medicine

## 2019-05-11 ENCOUNTER — Other Ambulatory Visit: Payer: Self-pay

## 2019-05-11 ENCOUNTER — Ambulatory Visit (INDEPENDENT_AMBULATORY_CARE_PROVIDER_SITE_OTHER): Payer: BC Managed Care – PPO | Admitting: Family Medicine

## 2019-05-11 VITALS — BP 124/76 | HR 87 | Temp 98.3°F | Ht 71.0 in | Wt 224.0 lb

## 2019-05-11 DIAGNOSIS — Z1211 Encounter for screening for malignant neoplasm of colon: Secondary | ICD-10-CM

## 2019-05-11 DIAGNOSIS — R809 Proteinuria, unspecified: Secondary | ICD-10-CM

## 2019-05-11 DIAGNOSIS — Z7189 Other specified counseling: Secondary | ICD-10-CM

## 2019-05-11 DIAGNOSIS — F17200 Nicotine dependence, unspecified, uncomplicated: Secondary | ICD-10-CM

## 2019-05-11 DIAGNOSIS — Z23 Encounter for immunization: Secondary | ICD-10-CM

## 2019-05-11 DIAGNOSIS — E1129 Type 2 diabetes mellitus with other diabetic kidney complication: Secondary | ICD-10-CM

## 2019-05-11 DIAGNOSIS — Z0000 Encounter for general adult medical examination without abnormal findings: Secondary | ICD-10-CM

## 2019-05-11 DIAGNOSIS — E119 Type 2 diabetes mellitus without complications: Secondary | ICD-10-CM

## 2019-05-11 MED ORDER — ATORVASTATIN CALCIUM 10 MG PO TABS: 10.0000 mg | ORAL_TABLET | Freq: Every day | ORAL | 3 refills | Status: DC

## 2019-05-11 NOTE — Patient Instructions (Addendum)
Start lipitor 10mg  at night and stop it/update me if you have aches on the medicine.  Recheck labs before a visit in about 3-4 months.  Take care.  Glad to see you.

## 2019-05-11 NOTE — Progress Notes (Signed)
CPE- See plan.  Routine anticipatory guidance given to patient.  See health maintenance.  The possibility exists that previously documented standard health maintenance information may have been brought forward from a previous encounter into this note.  If needed, that same information has been updated to reflect the current situation based on today's encounter.    Tetanus 2020 Flu due fall.  PNA 2013 Shingles d/w pt.  Out of stock.   Prostate cancer screening and PSA options (with potential risks and benefits of testing vs not testing) were discussed along with recent recs/guidelines.  He declined testing PSA at this point. D/w patient [id number] for colon cancer screening, including IFOB vs. colonoscopy.  Risks and benefits of both were discussed and patient voiced understanding.  Pt elects for: cologuard.  Living will d/w pt.  Would have his wife designated if patient were incapacitated.    He cut back smoking by about 70% on chantix.  No ADE on med except for being a little jittery the first few days and that got better.   Diabetes:  Using medications without difficulties: yes, 20-22 units a day.  Hypoglycemic episodes: no Hyperglycemic episodes: no Feet problems: no Blood Sugars averaging: usually ~ 125 in the AM.  occ higher at nights.   eye exam within last year: d/w pt, he is rescheduled.   Statin use d/w pt, ordered.    PMH and SH reviewed  Meds, vitals, and allergies reviewed.   ROS: Per HPI.  Unless specifically indicated otherwise in HPI, the patient denies:  General: fever. Eyes: acute vision changes ENT: sore throat Cardiovascular: chest pain Respiratory: SOB GI: vomiting GU: dysuria Musculoskeletal: acute back pain Derm: acute rash Neuro: acute motor dysfunction Psych: worsening mood Endocrine: polydipsia Heme: bleeding Allergy: hayfever  GEN: nad, alert and oriented HEENT: mucous membranes moist NECK: supple w/o LA CV: rrr. PULM: ctab, no inc wob ABD:  soft, +bs EXT: no edema SKIN: no acute rash (actinic changes noted on B arms and he'll f/u with derm, d/w pt)  Diabetic foot exam: Normal inspection No skin breakdown No calluses  Normal DP pulses Normal sensation to light touch and monofilament Nails normal

## 2019-05-13 DIAGNOSIS — Z0000 Encounter for general adult medical examination without abnormal findings: Secondary | ICD-10-CM | POA: Insufficient documentation

## 2019-05-13 NOTE — Assessment & Plan Note (Signed)
Living will d/w pt.   Would have his wife designated if patient were incapacitated.  

## 2019-05-13 NOTE — Assessment & Plan Note (Signed)
He cut back smoking by about 70% on chantix.  No ADE on med except for being a little jittery the first few days and that got better.  Encouraged to continue work on full cessation.

## 2019-05-13 NOTE — Assessment & Plan Note (Signed)
Tetanus 2020 Flu due fall.  PNA 2013 Shingles d/w pt.  Out of stock.   Prostate cancer screening and PSA options (with potential risks and benefits of testing vs not testing) were discussed along with recent recs/guidelines.  He declined testing PSA at this point. D/w patient [id number] for colon cancer screening, including IFOB vs. colonoscopy.  Risks and benefits of both were discussed and patient voiced understanding.  Pt elects for: cologuard.  Living will d/w pt.  Would have his wife designated if patient were incapacitated.

## 2019-05-13 NOTE — Assessment & Plan Note (Signed)
Blood Sugars averaging: usually ~ 125 in the AM.  occ higher at nights.   eye exam within last year: d/w pt, he is rescheduled.   Statin use d/w pt, ordered.  Rationale for use discussed with patient. No change in meds otherwise at this point.  He agrees.  Recheck periodically.  See after visit summary.

## 2019-05-14 NOTE — Addendum Note (Signed)
Addended by: Josetta Huddle on: 05/14/2019 12:01 PM   Modules accepted: Orders

## 2019-07-26 ENCOUNTER — Telehealth: Payer: Self-pay

## 2019-07-26 NOTE — Telephone Encounter (Signed)
Spoke with informing pt Dr. Damita Dunnings of notice from [employer] that they had not received his sample yet.  Pt admits he has not do it but will "get right on it and send it in".

## 2019-11-27 ENCOUNTER — Other Ambulatory Visit: Payer: Self-pay | Admitting: *Deleted

## 2019-11-27 MED ORDER — INSULIN PEN NEEDLE 31G X 5 MM MISC: 3 refills | Status: DC

## 2019-12-18 ENCOUNTER — Ambulatory Visit
Admission: EM | Admit: 2019-12-18 | Discharge: 2019-12-18 | Disposition: A | Discharge status: Home / Self Care | Payer: BC Managed Care – PPO | Attending: Emergency Medicine | Admitting: Emergency Medicine

## 2019-12-18 ENCOUNTER — Other Ambulatory Visit: Payer: Self-pay

## 2019-12-18 DIAGNOSIS — Z20822 Contact with and (suspected) exposure to covid-19: Secondary | ICD-10-CM | POA: Diagnosis not present

## 2019-12-18 NOTE — Discharge Instructions (Addendum)
COVID testing ordered.  It will take between 5-7 days for test results.  Someone will contact you regarding abnormal results.    In the meantime: You should remain isolated in your home for 10 days from symptom onset AND greater than 72 hours after symptoms resolution (absence of fever without the use of fever-reducing medication and improvement in respiratory symptoms), whichever is longer Get plenty of rest and push fluids Use medications daily for symptom relief Use OTC medications like ibuprofen or tylenol as needed fever or pain Call or go to the ED if you have any new or worsening symptoms such as fever, worsening cough, shortness of breath, chest tightness, chest pain, turning blue, changes in mental status, etc...  

## 2019-12-18 NOTE — ED Provider Notes (Signed)
[facility]    CSN: [id number] Arrival date & time: 12/18/19  1807      History   Chief Complaint Chief Complaint  Patient presents with  . covid test    HPI Stephen Shelton is a 64 y.o. male.   who presents for COVID testing after Covid exposure.  Denies sick exposure to COVID, flu or strep.  Denies recent travel.  Denies aggravating or alleviating symptoms.  Denies previous COVID infection.   Denies fever, chills, fatigue, nasal congestion, rhinorrhea, sore throat, cough, SOB, wheezing, chest pain, nausea, vomiting, changes in bowel or bladder habits.    The history is provided by the patient. A language interpreter was used.    Past Medical History:  Diagnosis Date  . Diabetes mellitus ([place])   . Septic arthritis ([facility])    R shoulder, history of  . Smoker     Patient Active Problem List   Diagnosis Date Noted  . Routine general medical examination at a health care facility 05/13/2019  . Groin pain 09/12/2018  . Advance care planning 03/20/2015  . Colon cancer screening 09/09/2012  . AK (actinic keratosis) 06/02/2012  . 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 LOW HDL 02/08/2007  . SMOKER 02/08/2007  . Essential hypertension 02/08/2007    Past Surgical History:  Procedure Laterality Date  . APPENDECTOMY         Home Medications    Prior to Admission medications   Medication Sig Start Date End Date Taking? Authorizing Provider  atorvastatin (LIPITOR) 10 MG tablet Take 1 tablet (10 mg total) by mouth daily. 05/11/19   Tonia Ghent, MD  CHANTIX 1 MG tablet TAKE 1 TABLET BY MOUTH TWICE A DAY 02/16/19   Tonia Ghent, MD  Insulin Glargine (LANTUS SOLOSTAR) 100 UNIT/ML Solostar Pen Inject 10-25 Units into the skin daily at 10 pm 03/31/19   Tonia Ghent, MD  Insulin Pen Needle 31G X 5 MM MISC Use daily with insulin pen.  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 11/27/19   Tonia Ghent, MD    Multiple Vitamin (MULTIVITAMIN) tablet Take 1 tablet by mouth daily.    [provider]    Family History Family History  Problem Relation Age of Onset  . Diabetes Father   . Autoimmune disease Father        liver and renal disease  . Cancer Mother        multiple myeloma  . Diabetes Maternal Grandfather   . Colon cancer Neg Hx   . Prostate cancer Neg Hx     Social History Social History   Tobacco Use  . Smoking status: Current Every Day Smoker    Packs/day: 1.50    Years: 33.00    Pack years: 49.50    Types: Cigarettes  . Smokeless tobacco: Never Used  Substance Use Topics  . Alcohol use: Yes    Alcohol/week: 0.0 standard drinks    Comment: rarely  . Drug use: No     Allergies   Metformin and related and Sulfa antibiotics   Review of Systems Review of Systems  Constitutional: Negative.   HENT: Negative.   Respiratory: Negative.   Cardiovascular: Negative.   Gastrointestinal: Negative.   Neurological: Negative.      Physical Exam Triage Vital Signs ED Triage Vitals  Enc Vitals Group     BP 12/18/19 1817 (!) 161/91     Pulse Rate 12/18/19 1817 80  Resp 12/18/19 1817 16     Temp 12/18/19 1817 98.3 F (36.8 C)     Temp Source 12/18/19 1817 Oral     SpO2 12/18/19 1817 97 %     Weight --      Height --      Head Circumference --      Peak Flow --      Pain Score 12/18/19 1829 0     Pain Loc --      Pain Edu? --      Excl. in [place]? --    No data found.  Updated Vital Signs BP (!) 161/91 (BP Location: Right Arm)   Pulse 80   Temp 98.3 F (36.8 C) (Oral)   Resp 16   SpO2 97%   Visual Acuity Right Eye Distance:   Left Eye Distance:   Bilateral Distance:    Right Eye Near:   Left Eye Near:    Bilateral Near:     Physical Exam Vitals and nursing note reviewed.  Constitutional:      General: He is not in acute distress.    Appearance: Normal appearance. He is normal weight. He is not ill-appearing or toxic-appearing.  HENT:      Head: Normocephalic.     Right Ear: Tympanic membrane, ear canal and external ear normal. There is no impacted cerumen.     Left Ear: Tympanic membrane, ear canal and external ear normal. There is no impacted cerumen.     Nose: Nose normal. No congestion.     Mouth/Throat:     Mouth: Mucous membranes are moist.     Pharynx: Oropharynx is clear. No oropharyngeal exudate or posterior oropharyngeal erythema.  Cardiovascular:     Rate and Rhythm: Normal rate and regular rhythm.     Pulses: Normal pulses.     Heart sounds: Normal heart sounds. No murmur.  Pulmonary:     Effort: Pulmonary effort is normal. No respiratory distress.     Breath sounds: Normal breath sounds. No wheezing or rhonchi.  Chest:     Chest wall: No tenderness.  Abdominal:     General: Abdomen is flat. Bowel sounds are normal. There is no distension.     Palpations: There is no mass.     Tenderness: There is no abdominal tenderness.  Skin:    Capillary Refill: Capillary refill takes less than 2 seconds.  Neurological:     General: No focal deficit present.     Mental Status: He is alert and oriented to person, place, and time.      UC Treatments / Results  Labs (all labs ordered are listed, but only abnormal results are displayed) Labs Reviewed  NOVEL CORONAVIRUS, NAA    EKG   Radiology No results found.  Procedures Procedures (including critical care time)  Medications Ordered in UC Medications - No data to display  Initial Impression / Assessment and Plan / UC Course  I have reviewed the triage vital signs and the nursing notes.  Pertinent labs & imaging results that were available during my care of the patient were reviewed by me and considered in my medical decision making (see chart for details).   COVID-19 test was ordered Advised patient to quarantine To go to ED for worsening of symptoms Patient verbalized understanding plan of care  Final Clinical Impressions(s) / UC Diagnoses    Final diagnoses:  Suspected COVID-19 virus infection     Discharge Instructions     COVID testing ordered.  It will take between 5-7 days for test results.  Someone will contact you regarding abnormal results.    In the meantime: You should remain isolated in your home for 10 days from symptom onset AND greater than 72 hours after symptoms resolution (absence of fever without the use of fever-reducing medication and improvement in respiratory symptoms), whichever is longer Get plenty of rest and push fluids Use medications daily for symptom relief Use OTC medications like ibuprofen or tylenol as needed fever or pain Call or go to the ED if you have any new or worsening symptoms such as fever, worsening cough, shortness of breath, chest tightness, chest pain, turning blue, changes in mental status, etc...     ED Prescriptions    None     PDMP not reviewed this encounter.   Emerson Monte, FNP 12/18/19 Bosie Helper

## 2019-12-18 NOTE — ED Triage Notes (Signed)
Pt presents to UC for covid test. Pt has had positive covid exposure at work. Denies symptoms at this time.

## 2019-12-19 LAB — NOVEL CORONAVIRUS, NAA: SARS-CoV-2, NAA: NOT DETECTED

## 2019-12-20 ENCOUNTER — Encounter: Payer: Self-pay | Admitting: Family Medicine

## 2019-12-26 ENCOUNTER — Encounter: Payer: Self-pay | Admitting: Family Medicine

## 2020-01-27 DIAGNOSIS — Z23 Encounter for immunization: Secondary | ICD-10-CM | POA: Diagnosis not present

## 2020-01-29 DIAGNOSIS — E118 Type 2 diabetes mellitus with unspecified complications: Secondary | ICD-10-CM | POA: Diagnosis not present

## 2020-02-06 DIAGNOSIS — L821 Other seborrheic keratosis: Secondary | ICD-10-CM | POA: Diagnosis not present

## 2020-02-06 DIAGNOSIS — D225 Melanocytic nevi of trunk: Secondary | ICD-10-CM | POA: Diagnosis not present

## 2020-02-06 DIAGNOSIS — C4441 Basal cell carcinoma of skin of scalp and neck: Secondary | ICD-10-CM | POA: Diagnosis not present

## 2020-02-06 DIAGNOSIS — L570 Actinic keratosis: Secondary | ICD-10-CM | POA: Diagnosis not present

## 2020-02-06 DIAGNOSIS — X32XXXA Exposure to sunlight, initial encounter: Secondary | ICD-10-CM | POA: Diagnosis not present

## 2020-02-17 DIAGNOSIS — Z23 Encounter for immunization: Secondary | ICD-10-CM | POA: Diagnosis not present

## 2020-02-29 ENCOUNTER — Encounter: Payer: Self-pay | Admitting: Family Medicine

## 2020-02-29 DIAGNOSIS — E118 Type 2 diabetes mellitus with unspecified complications: Secondary | ICD-10-CM | POA: Diagnosis not present

## 2020-02-29 LAB — LAB REPORT - SCANNED
A1c: 7.7
BUN: 12 (ref 4–21)
Cholesterol: 167
Creatinine, Ser: 0.84
Glucose: 125
HDL: 44
LDL (calc): 107
Triglycerides: 86 (ref 40–160)

## 2020-03-03 ENCOUNTER — Encounter: Payer: Self-pay | Admitting: Family Medicine

## 2020-03-03 LAB — LAB REPORT - SCANNED
A1c: 7.7
Cholesterol: 167
Creatinine, Ser: 0.84
Glucose: 125
HDL: 44
LDL (calc): 107
Triglycerides: 86 (ref 40–160)

## 2020-03-10 ENCOUNTER — Encounter: Payer: Self-pay | Admitting: Family Medicine

## 2020-03-10 LAB — LAB REPORT - SCANNED
A1c: 7.7
Cholesterol: 167
Creatinine, Ser: 0.84
Glucose: 125
HDL: 44
LDL (calc): 107
Triglycerides: 86 (ref 40–160)

## 2020-03-12 DIAGNOSIS — C44319 Basal cell carcinoma of skin of other parts of face: Secondary | ICD-10-CM | POA: Diagnosis not present

## 2020-03-12 DIAGNOSIS — Z85828 Personal history of other malignant neoplasm of skin: Secondary | ICD-10-CM | POA: Diagnosis not present

## 2020-03-12 DIAGNOSIS — Z08 Encounter for follow-up examination after completed treatment for malignant neoplasm: Secondary | ICD-10-CM | POA: Diagnosis not present

## 2020-04-08 ENCOUNTER — Encounter: Payer: Self-pay | Admitting: Family Medicine

## 2020-04-08 LAB — LAB REPORT - SCANNED
A1c: 7.7
Cholesterol: 167
Creatinine, Ser: 0.84
Glucose: 125
HDL: 44
LDL (calc): 107
Triglycerides: 86 (ref 40–160)

## 2020-05-30 ENCOUNTER — Telehealth: Payer: Self-pay

## 2020-05-30 DIAGNOSIS — Z1211 Encounter for screening for malignant neoplasm of colon: Secondary | ICD-10-CM

## 2020-05-30 DIAGNOSIS — E119 Type 2 diabetes mellitus without complications: Secondary | ICD-10-CM

## 2020-05-30 NOTE — Telephone Encounter (Signed)
Spoke to patient about the expired cologuard order. Pt stated that he is still interested in have it done. Informed pt we could reorderd. Pt requested an appointment as he is due for a visit. Pt has been scheduled for a CPE 06-09-20. Last message in chart made mention of labs prior to appt. I have not made that appt yet, no labs are entered. Pt has requested to have an a1c done. He is very interested in what it is since his lifestyle modifications.

## 2020-06-01 NOTE — Telephone Encounter (Signed)
Reasonable to get interval labs done prior to the visit if possible.  I put in the orders.  Please set up a repeat order for Cologuard.  Thanks.

## 2020-06-02 ENCOUNTER — Ambulatory Visit: Payer: BC Managed Care – PPO | Admitting: Family Medicine

## 2020-06-02 DIAGNOSIS — Z0289 Encounter for other administrative examinations: Secondary | ICD-10-CM

## 2020-06-03 NOTE — Telephone Encounter (Signed)
Order placed and faxed for cologuard.   Tried to contact pt to schedule for labs before apt on 7/19 but no answer on home or cell and no VMs.

## 2020-06-03 NOTE — Addendum Note (Signed)
Addended by: Randall An on: 06/03/2020 12:35 PM   Modules accepted: Orders

## 2020-06-05 NOTE — Telephone Encounter (Signed)
Tried home and cell number to schedule lab visit and no answer and no VM

## 2020-06-06 NOTE — Telephone Encounter (Signed)
Patient is scheduled for CPE on Monday.

## 2020-06-09 ENCOUNTER — Encounter: Payer: Self-pay | Admitting: Family Medicine

## 2020-06-09 ENCOUNTER — Other Ambulatory Visit: Payer: Self-pay

## 2020-06-09 ENCOUNTER — Ambulatory Visit (INDEPENDENT_AMBULATORY_CARE_PROVIDER_SITE_OTHER): Payer: BC Managed Care – PPO | Admitting: Family Medicine

## 2020-06-09 VITALS — BP 144/74 | HR 83 | Temp 97.4°F | Ht 71.0 in | Wt 205.0 lb

## 2020-06-09 DIAGNOSIS — E119 Type 2 diabetes mellitus without complications: Secondary | ICD-10-CM

## 2020-06-09 DIAGNOSIS — R809 Proteinuria, unspecified: Secondary | ICD-10-CM

## 2020-06-09 DIAGNOSIS — E1129 Type 2 diabetes mellitus with other diabetic kidney complication: Secondary | ICD-10-CM

## 2020-06-09 DIAGNOSIS — F17200 Nicotine dependence, unspecified, uncomplicated: Secondary | ICD-10-CM

## 2020-06-09 DIAGNOSIS — R195 Other fecal abnormalities: Secondary | ICD-10-CM

## 2020-06-09 DIAGNOSIS — Z0000 Encounter for general adult medical examination without abnormal findings: Secondary | ICD-10-CM | POA: Diagnosis not present

## 2020-06-09 DIAGNOSIS — Z7189 Other specified counseling: Secondary | ICD-10-CM

## 2020-06-09 LAB — COMPREHENSIVE METABOLIC PANEL
ALT: 14 U/L (ref 0–53)
AST: 14 U/L (ref 0–37)
Albumin: 4.2 g/dL (ref 3.5–5.2)
Alkaline Phosphatase: 58 U/L (ref 39–117)
BUN: 15 mg/dL (ref 6–23)
CO2: 28 mEq/L (ref 19–32)
Calcium: 9.2 mg/dL (ref 8.4–10.5)
Chloride: 106 mEq/L (ref 96–112)
Creatinine, Ser: 0.88 mg/dL (ref 0.40–1.50)
GFR: 87.15 mL/min (ref >60.00)
Glucose, Bld: 131 mg/dL — ABNORMAL HIGH (ref 70–99)
Potassium: 3.8 mEq/L (ref 3.5–5.1)
Sodium: 141 mEq/L (ref 135–145)
Total Bilirubin: 0.6 mg/dL (ref 0.2–1.2)
Total Protein: 6.3 g/dL (ref 6.0–8.3)

## 2020-06-09 LAB — MICROALBUMIN / CREATININE URINE RATIO
Creatinine,U: 128.1 mg/dL
Microalb Creat Ratio: 2.4 mg/g (ref 0.0–30.0)
Microalb, Ur: 3.1 mg/dL — ABNORMAL HIGH (ref 0.0–1.9)

## 2020-06-09 LAB — LIPID PANEL
Cholesterol: 142 mg/dL (ref 0–200)
HDL: 40 mg/dL (ref >39.00)
LDL Cholesterol: 80 mg/dL (ref 0–99)
NonHDL: 102.38
Total CHOL/HDL Ratio: 4
Triglycerides: 113 mg/dL (ref 0.0–149.0)
VLDL: 22.6 mg/dL (ref 0.0–40.0)

## 2020-06-09 LAB — HEMOGLOBIN A1C: Hgb A1c MFr Bld: 5.9 % (ref 4.6–6.5)

## 2020-06-09 MED ORDER — ATORVASTATIN CALCIUM 10 MG PO TABS: 10.0000 mg | ORAL_TABLET | Freq: Every day | ORAL | 3 refills | Status: DC

## 2020-06-09 MED ORDER — VARENICLINE TARTRATE 1 MG PO TABS: 1.0000 mg | ORAL_TABLET | Freq: Two times a day (BID) | ORAL | 1 refills | Status: DC

## 2020-06-09 MED ORDER — CHANTIX STARTING MONTH PAK 0.5 MG X 11 & 1 MG X 42 PO TABS: ORAL_TABLET | ORAL | 0 refills | Status: DC

## 2020-06-09 NOTE — Patient Instructions (Addendum)
Check with your insurance to see if they will cover the shingles shot. Call about an eye clinic appointment when possible.  Go to the lab on the way out.   If you have mychart we'll likely use that to update you.    Take care.  Glad to see you. Update me as needed.  Thanks for your effort.

## 2020-06-09 NOTE — Progress Notes (Signed)
This visit occurred during the SARS-CoV-2 public health emergency.  Safety protocols were in place, including screening questions prior to the visit, additional usage of staff PPE, and extensive cleaning of exam room while observing appropriate contact time as indicated for disinfecting solutions.  CPE- See plan.  Routine anticipatory guidance given to patient.  See health maintenance.  The possibility exists that previously documented standard health maintenance information may have been brought forward from a previous encounter into this note.  If needed, that same information has been updated to reflect the current situation based on today's encounter.    Tetanus 2020 Flu due fall.  PNA 2013 Shingles d/w pt.   covid vaccine prev done.  Moderna.   Prostate cancer screening and PSA options (with potential risks and benefits of testing vs not testing) were discussed along with recent recs/guidelines.  He declined testing PSA at this point. D/w patient [id number] for colon cancer screening, including IFOB vs. colonoscopy.  Risks and benefits of both were discussed and patient voiced understanding.  Pt elects for: cologuard.  Living will d/w pt.  Would have his wife designated if patient were incapacitated.   AAA screening d/w pt, due at 57-75.    He has frequent nonbloody loose stools.  No abd pain.  He has some occ less loose stools. His stools can be a little greasy.  Initially improved with keto diet but then sx returned.  Sx predate diet change.    He cut back smoking, down to ~1/2 PPD.   No ADE on chantix except for being a little jittery the first few days and that got better. He is off med currently.  He wanted to restart.    Diabetes:  No meds.    Hypoglycemic episodes: no Hyperglycemic episodes: no Feet problems: no Blood Sugars averaging: 95-120 eye exam within last year: d/w pt, he is rescheduled.   He had been working on diet and exercise, d/w pt.   He feels better overall.     Elevated Cholesterol: Using medications without problems: yes Muscle aches: no Diet compliance: yes Exercise: yes Still on atorvastatin.    PMH and SH reviewed  Meds, vitals, and allergies reviewed.   ROS: Per HPI.  Unless specifically indicated otherwise in HPI, the patient denies:  General: fever. Eyes: acute vision changes ENT: sore throat Cardiovascular: chest pain Respiratory: SOB GI: vomiting GU: dysuria Musculoskeletal: acute back pain Derm: acute rash Neuro: acute motor dysfunction Psych: worsening mood Endocrine: polydipsia Heme: bleeding Allergy: hayfever  GEN: nad, alert and oriented HEENT: ncat NECK: supple w/o LA CV: rrr. PULM: ctab, no inc wob ABD: soft, +bs EXT: no edema SKIN: no acute rash  Diabetic foot exam: Normal inspection except for corns on 1st and 2nd R toe in the 1st web spaces.  He has been using acid plasters with relief/dec in size.   No skin breakdown No calluses  Normal DP pulses Normal sensation to light touch and monofilament Nails normal

## 2020-06-11 DIAGNOSIS — R195 Other fecal abnormalities: Secondary | ICD-10-CM | POA: Insufficient documentation

## 2020-06-11 NOTE — Assessment & Plan Note (Signed)
He cut back smoking, down to ~1/2 PPD.   No ADE on chantix except for being a little jittery the first few days and that got better. He is off med currently.  He wanted to restart.   Prescription sent with routine cautions.

## 2020-06-11 NOTE — Assessment & Plan Note (Addendum)
Reasonable to check fecal elastase.  Discussed with patient.  Lab pending.  Benign abdominal exam.  Addendum. 08/06/20.  See interval phone notes.  Low fecal elastase noted, consistent with pancreatic insufficiency.  Needs imaging to exclude underlying medical condition that could cause pancreatic insufficiency.  Needs evaluation for occult mass, cyst, obstruction, malignancy.  Needs abdominal imaging and we have tried to order CT multiple times in the meantime and this has been denied by his insurance company.  In spite of multiple phone calls, no one has been willing to speak to me on a peer to peer review and no one has offered adequate explanation for denial of CT.  We will restart the process again to get approval.

## 2020-06-11 NOTE — Assessment & Plan Note (Addendum)
Tetanus 2020 Flu due fall.  PNA 2013 Shingles d/w pt.   covid vaccine prev done.  Moderna.   Prostate cancer screening and PSA options (with potential risks and benefits of testing vs not testing) were discussed along with recent recs/guidelines.  He declined testing PSA at this point. D/w patient [id number] for colon cancer screening, including IFOB vs. colonoscopy.  Risks and benefits of both were discussed and patient voiced understanding.  Pt elects for: cologuard.  Living will d/w pt.  Would have his wife designated if patient were incapacitated.   AAA screening d/w pt, due at 31-75.

## 2020-06-11 NOTE — Assessment & Plan Note (Addendum)
Off medication.  See notes on labs.  He has been working hard on diet and exercise.  Blood sugar has been controlled at home without hypoglycemia.  Has been able to tolerate atorvastatin.  See notes on labs.

## 2020-06-11 NOTE — Assessment & Plan Note (Signed)
Living will d/w pt.   Would have his wife designated if patient were incapacitated.  

## 2020-06-12 ENCOUNTER — Other Ambulatory Visit: Payer: Self-pay | Admitting: Family Medicine

## 2020-06-12 DIAGNOSIS — R739 Hyperglycemia, unspecified: Secondary | ICD-10-CM

## 2020-06-12 DIAGNOSIS — R809 Proteinuria, unspecified: Secondary | ICD-10-CM

## 2020-06-19 ENCOUNTER — Other Ambulatory Visit: Payer: BC Managed Care – PPO

## 2020-06-19 DIAGNOSIS — R195 Other fecal abnormalities: Secondary | ICD-10-CM

## 2020-06-26 LAB — PANCREATIC ELASTASE, FECAL: Pancreatic Elastase-1, Stool: 43 mcg/g — ABNORMAL LOW

## 2020-06-29 ENCOUNTER — Telehealth: Payer: Self-pay | Admitting: Family Medicine

## 2020-06-29 DIAGNOSIS — K8689 Other specified diseases of pancreas: Secondary | ICD-10-CM | POA: Insufficient documentation

## 2020-06-29 DIAGNOSIS — R197 Diarrhea, unspecified: Secondary | ICD-10-CM

## 2020-06-29 MED ORDER — PANCRELIPASE (LIP-PROT-AMYL) 36000-114000 UNITS PO CPEP: ORAL_CAPSULE | ORAL | 3 refills | Status: DC

## 2020-06-29 NOTE — Telephone Encounter (Signed)
Call patient.  Fecal elastase is low, consistent with pancreatic insufficiency.  This is likely causing the loose stools.  Given the diabetes and pancreatic insufficiency we should image his abdomen.  I put in the order for the CT scan.  I would start taking Creon as a pancreatic enzyme replacement.  See if he notices an improvement in his stools with taking that.  If he notices other changes then let me know.  Either way I would like to know how he is doing about 1 week after starting the medication.  Thanks.

## 2020-06-30 DIAGNOSIS — E118 Type 2 diabetes mellitus with unspecified complications: Secondary | ICD-10-CM | POA: Diagnosis not present

## 2020-06-30 NOTE — Telephone Encounter (Addendum)
Patient advised and agrees with CT of abdomen.  Patient will report to office after having been on Creon for about a week.

## 2020-07-01 ENCOUNTER — Encounter: Payer: Self-pay | Admitting: Family Medicine

## 2020-07-03 ENCOUNTER — Telehealth: Payer: Self-pay | Admitting: Family Medicine

## 2020-07-03 NOTE — Telephone Encounter (Signed)
Call back re: CT appeal.   Needs recent notes faxed to [phone], attention appeals.   I printed the notes.   Will await appeal result.   Thanks.

## 2020-07-03 NOTE — Telephone Encounter (Signed)
Faxed information to number listed.

## 2020-07-08 ENCOUNTER — Encounter: Payer: Self-pay | Admitting: Family Medicine

## 2020-07-11 ENCOUNTER — Encounter: Payer: Self-pay | Admitting: Family Medicine

## 2020-07-16 ENCOUNTER — Telehealth: Payer: Self-pay | Admitting: Family Medicine

## 2020-07-16 NOTE — Telephone Encounter (Signed)
Recap of phone call today.  [employer] company about CT abdomen pelvis.  This was previously denied.  Additional information was requested.  We sent that.  I was initially told today that information had never been received.  Later in the conversation I was told that it was received.  I found out today that the appeal had been denied without any chance for peer to peer review through this department of his insurance company.  Additionally his insurance company has locked out the window for CT abdomen pelvis to where I cannot restart the process with another order.  This was done without my knowledge and without any peer-to-peer review through me.    There was never any delay in information transmission from our office to the insurance company.  We responded to all requests.  We now have to start and appeal denial from [employer] through [facility].  We will start this.    His insurance company has been completely unhelpful and I am profoundly disappointed in them delaying this patient's care.  We will attempt to work around the obstacles that his insurance company has erected.

## 2020-07-20 ENCOUNTER — Telehealth: Payer: Self-pay | Admitting: Family Medicine

## 2020-07-20 DIAGNOSIS — K8689 Other specified diseases of pancreas: Secondary | ICD-10-CM

## 2020-07-20 DIAGNOSIS — E119 Type 2 diabetes mellitus without complications: Secondary | ICD-10-CM

## 2020-07-20 NOTE — Telephone Encounter (Addendum)
Please update patient about his recent labs that were done at an outside facility.  His sugar was mildly elevated but his A1c was controlled at 5.8.  He does still have some insulin production with a C-peptide level of 2.6.  This means he is still making some of his own insulin.  However his GAD-65 was positive which means that he may be at risk for developing type 1 diabetes in the future.  We know that he is not completely type I diabetic at this point since his A1c is controlled, his sugar is only mildly elevated, he is not taking any medications, and he still has some insulin production on his own.  It does not appear that these results change his plan at this point.  I think it makes sense to continue to monitor his sugar as planned.  We can refer him to endocrinology if desired.  Thanks.

## 2020-07-21 NOTE — Telephone Encounter (Signed)
Patient returning call from [place]. Please advise.

## 2020-07-21 NOTE — Telephone Encounter (Signed)
Patient advised and does not feel that he needs to see an endocrinologist right now.  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  Patient says he is feeling much better since starting the Creon.

## 2020-07-21 NOTE — Telephone Encounter (Signed)
No answer, no VM available.  Will try again later.

## 2020-07-22 NOTE — Telephone Encounter (Signed)
Noted. Thanks.

## 2020-07-22 NOTE — Telephone Encounter (Signed)
Pt called back he changed his mind and does want to proceed with Endo referral. Pt said he can go to either [place] or [place], he just wants to go to whatever Endo doc Dr. Damita Dunnings thinks is the best. I advise pt Dr. Damita Dunnings will put referral in and our [facility] will call to schedule appt. Please put referral in, Thanks

## 2020-07-23 NOTE — Addendum Note (Signed)
Addended by: Tonia Ghent on: 07/23/2020 12:45 PM   Modules accepted: Orders

## 2020-07-23 NOTE — Telephone Encounter (Signed)
Put in the referral.  Thanks.

## 2020-07-28 NOTE — Telephone Encounter (Signed)
Called to start the next part of appeal, LMOVM, awaiting return call.

## 2020-08-06 ENCOUNTER — Telehealth: Payer: Self-pay | Admitting: Family Medicine

## 2020-08-06 NOTE — Telephone Encounter (Signed)
Approximately 45 minutes spent on multiple phone calls amongst multiple departments with both [employer] and [employer].  I had called last week to request a phone call back to appeal the denial of his CT abdomen pelvis.  I never got a phone call back.  I called again multiple times today.  I cannot penetrate the bureaucracy to get to a person who can authorize the scan.  When I call [employer] they refer me to [employer] and [employer].  When I call [facility] they transfer/refer me back to [employer].  No one at [employer] company is apparently willing to take responsibility for any decisions that they have made so far and at this point no one has been willing to tell me who denied the scan.    I will update/addend his previous office visit note.  Please update patient.  Thanks.

## 2020-08-07 DIAGNOSIS — Z08 Encounter for follow-up examination after completed treatment for malignant neoplasm: Secondary | ICD-10-CM | POA: Diagnosis not present

## 2020-08-07 DIAGNOSIS — D0462 Carcinoma in situ of skin of left upper limb, including shoulder: Secondary | ICD-10-CM | POA: Diagnosis not present

## 2020-08-07 DIAGNOSIS — Z85828 Personal history of other malignant neoplasm of skin: Secondary | ICD-10-CM | POA: Diagnosis not present

## 2020-08-07 NOTE — Telephone Encounter (Addendum)
Restarted the process for CT Abd/Pelvis W CM through [employer] This was approved as of 08/07/20  Approval #[id number], Exp 02/02/2021  Pt is scheduled for Monday morning 08/11/20 at 10:30 Pt aware to go by [facility] today to pick up his contrast bottles and instructions.  Pt aware to remain NPO the morning of the test   Patient given instructions verbally and also sent a MyChart message with all the information.  Will send to Dr Damita Dunnings as Juluis Rainier.  Nothing further needed.

## 2020-08-07 NOTE — Telephone Encounter (Signed)
Thanks

## 2020-08-11 ENCOUNTER — Other Ambulatory Visit: Payer: Self-pay

## 2020-08-11 ENCOUNTER — Ambulatory Visit
Admission: RE | Admit: 2020-08-11 | Discharge: 2020-08-11 | Disposition: A | Discharge status: Home / Self Care | Payer: BC Managed Care – PPO | Source: Ambulatory Visit | Attending: Family Medicine | Admitting: Family Medicine

## 2020-08-11 DIAGNOSIS — N400 Enlarged prostate without lower urinary tract symptoms: Secondary | ICD-10-CM | POA: Diagnosis not present

## 2020-08-11 DIAGNOSIS — R197 Diarrhea, unspecified: Secondary | ICD-10-CM | POA: Insufficient documentation

## 2020-08-11 DIAGNOSIS — M47816 Spondylosis without myelopathy or radiculopathy, lumbar region: Secondary | ICD-10-CM | POA: Diagnosis not present

## 2020-08-11 DIAGNOSIS — I700 Atherosclerosis of aorta: Secondary | ICD-10-CM | POA: Diagnosis not present

## 2020-08-11 LAB — POCT I-STAT CREATININE: Creatinine, Ser: 0.7 mg/dL (ref 0.61–1.24)

## 2020-08-11 MED ORDER — IOHEXOL 300 MG/ML  SOLN
100.0000 mL | Freq: Once | INTRAMUSCULAR | Status: AC | PRN
  Administered 2020-08-11: 100 mL via INTRAVENOUS

## 2020-08-14 ENCOUNTER — Encounter: Payer: Self-pay | Admitting: Family Medicine

## 2020-08-14 DIAGNOSIS — I700 Atherosclerosis of aorta: Secondary | ICD-10-CM | POA: Insufficient documentation

## 2020-08-26 ENCOUNTER — Other Ambulatory Visit: Payer: Self-pay | Admitting: *Deleted

## 2020-08-26 MED ORDER — PANCRELIPASE (LIP-PROT-AMYL) 36000-114000 UNITS PO CPEP: ORAL_CAPSULE | ORAL | 3 refills | Status: DC

## 2020-08-27 ENCOUNTER — Encounter: Payer: Self-pay | Admitting: Internal Medicine

## 2020-08-27 ENCOUNTER — Other Ambulatory Visit: Payer: Self-pay

## 2020-08-27 ENCOUNTER — Ambulatory Visit: Payer: BC Managed Care – PPO | Admitting: Internal Medicine

## 2020-08-27 VITALS — BP 168/90 | HR 83 | Wt 205.6 lb

## 2020-08-27 DIAGNOSIS — K8689 Other specified diseases of pancreas: Secondary | ICD-10-CM | POA: Diagnosis not present

## 2020-08-27 DIAGNOSIS — R739 Hyperglycemia, unspecified: Secondary | ICD-10-CM | POA: Diagnosis not present

## 2020-08-27 DIAGNOSIS — E139 Other specified diabetes mellitus without complications: Secondary | ICD-10-CM | POA: Insufficient documentation

## 2020-08-27 LAB — POCT GLUCOSE (DEVICE FOR HOME USE): Glucose Fasting, POC: 150 mg/dL — AB (ref 70–99)

## 2020-08-27 LAB — POCT GLYCOSYLATED HEMOGLOBIN (HGB A1C): Hemoglobin A1C: 5.7 % — AB (ref 4.0–5.6)

## 2020-08-27 NOTE — Patient Instructions (Signed)
-   Continue to check your sugar three times a day  - Please contact our office should your sugars remain at or above 180 mg/dL

## 2020-08-27 NOTE — Progress Notes (Signed)
Name: Stephen Shelton  MRN/ DOB: [id number], 09/18/1956   Age/ Sex: 64 y.o., male    PCP: Tonia Ghent, MD   Reason for Endocrinology Evaluation: Type 2 Diabetes Mellitus     Date of Initial Endocrinology Visit: 08/27/2020     PATIENT IDENTIFIER: Stephen Shelton is a 64 y.o. male with a past medical history of HTN, T2DM and . The patient presented for initial endocrinology clinic visit on 08/27/2020 for consultative assistance with his diabetes management.    HPI: Stephen Shelton was    Diagnosed with DM in 2004 Prior Medications tried/Intolerance: Metformin - abdominal pain. He was on insulin 12/2017 but was taken off through [facility] 12/2019  Currently checking blood sugars 3 x / day,  before meals.  Hypoglycemia episodes : yes              Symptoms: no symptoms           Frequency: 1/ mont Hemoglobin A1c has ranged from 5.9% in 2021, peaking at 10.9% in 2019. Patient required assistance for hypoglycemia:  Patient has required hospitalization within the last 1 year from hyper or hypoglycemia:   In terms of diet, the patient eats 3 meals day . Does not snack. Avoids sugar-sweetened beverages    He is on [employer] health program  ( which is a keto diet) , lost 30 lbs   Currently on Creon for pancreatic insufficiency , has been having loose stools for ~ 2 years which has helped.   Maternal Grandfather with DM Father with DM  Daughter with T1DM and thyroid disease     HOME DIABETES REGIMEN: N/A   Statin: yes ACE-I/ARB: no   METER DOWNLOAD SUMMARY: did not   DIABETIC COMPLICATIONS: Microvascular complications:    Denies: CKD, retinopathy, neuropathy   Last eye exam: Completed 4 yrs   Macrovascular complications:    Denies: CAD, PVD, CVA   PAST HISTORY: Past Medical History:  Past Medical History:  Diagnosis Date   Diabetes mellitus ([place])    Septic arthritis ([place])    R shoulder, history of   Smoker    Past Surgical History:  Past Surgical History:   Procedure Laterality Date   APPENDECTOMY        Social History:  reports that he has been smoking cigarettes. He has a 16.50 pack-year smoking history. He has never used smokeless tobacco. He reports current alcohol use. He reports that he does not use drugs. Family History:  Family History  Problem Relation Age of Onset   Diabetes Father    Autoimmune disease Father        liver and renal disease   Cancer Mother        multiple myeloma   Diabetes Maternal Grandfather    Colon cancer Neg Hx    Prostate cancer Neg Hx      HOME MEDICATIONS: Allergies as of 08/27/2020      Reactions   Metformin And Related Other (See Comments)   abd pain with med use   Sulfa Antibiotics Other (See Comments)   Lightheaded, intolerant, "felt sick"      Medication List       Accurate as of August 27, 2020  1:55 PM. If you have any questions, ask your nurse or doctor.        atorvastatin 10 MG tablet Commonly known as: LIPITOR Take 1 tablet (10 mg total) by mouth daily.   Chantix Starting Month Pak 0.5 MG X 11 &  1 MG X 42 tablet Generic drug: varenicline Take one 0.5 mg tablet daily for 3 days, then one 0.5 mg tablet twice daily for 4 days, then increase to one 1 mg tablet twice daily.   varenicline 1 MG tablet Commonly known as: Chantix Continuing Month Pak Take 1 tablet (1 mg total) by mouth 2 (two) times daily.   lipase/protease/amylase 36000 UNITS Cpep capsule Commonly known as: Creon Take 1-2 capsules (36,000-72,000 Units total) by mouth 3 (three) times daily with meals. May also take 1 capsule (36,000 Units total) as needed (with snacks).   multivitamin tablet Take 1 tablet by mouth daily.        ALLERGIES: Allergies  Allergen Reactions   Metformin And Related Other (See Comments)    abd pain with med use   Sulfa Antibiotics Other (See Comments)    Lightheaded, intolerant, "felt sick"     REVIEW OF SYSTEMS: A comprehensive ROS was conducted with the  patient and is negative except as per HPI     OBJECTIVE:   VITAL SIGNS: BP (!) 168/90 (BP Location: Left Arm, Patient Position: Sitting, Cuff Size: Normal)    Pulse 83    Wt 205 lb 9.6 oz (93.3 kg)    SpO2 96%    BMI 28.68 kg/m    PHYSICAL EXAM:  General: Pt appears well and is in NAD  Neck: General: Supple without adenopathy or carotid bruits. Thyroid: Thyroid size normal.  No goiter or nodules appreciated. No thyroid bruit.  Lungs: Clear with good BS bilat with no rales, rhonchi, or wheezes  Heart: RRR with normal S1 and S2 and no gallops; no murmurs; no rub  Abdomen: Normoactive bowel sounds, soft, nontender, without masses or organomegaly palpable  Extremities:  Lower extremities - No pretibial edema. No lesions.  Skin: Normal texture and temperature to palpation. No rash noted. No Acanthosis nigricans/skin tags. No lipohypertrophy.  Neuro: MS is good with appropriate affect, pt is alert and Ox3    DM foot exam: 08/27/2020  The skin of the feet is intact without sores or ulcerations. The pedal pulses are 2+ on right and 2+ on left. The sensation is decreased  to a screening 5.07, 10 gram monofilament on the right    DATA REVIEWED:  Lab Results  Component Value Date   HGBA1C 5.7 (A) 08/27/2020   HGBA1C 5.9 06/09/2020   HGBA1C 7.1 (H) 05/09/2019   Lab Results  Component Value Date   MICROALBUR 3.1 (H) 06/09/2020   LDLCALC 80 06/09/2020   CREATININE 0.70 08/11/2020   Lab Results  Component Value Date   MICRALBCREAT 2.4 06/09/2020    Lab Results  Component Value Date   CHOL 142 06/09/2020   HDL 40.00 06/09/2020   LDLCALC 80 06/09/2020   TRIG 113.0 06/09/2020   CHOLHDL 4 06/09/2020       07/07/2020  Gluc 121 BUN/Cr 14/0.64 GFR 103 A1c 5.8%  C- peptide 2.6 ng/mL GAD-65 89.7 U/mL   ASSESSMENT / PLAN / RECOMMENDATIONS:   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 Goal A1c < 7.0 %.   - We discussed  the immune mediated destruction of LADA and its mechanism in causing diabetes. His C-peptid was detectable at 2.6 ng/mL with a serum glucose of 121 mg/dL . He understands that eventually he will need insulin but at this time due to low carb diet, he is able to manage.  - He will continue to follow  a strict keto diet at this time, he was advised to continue regular glucose check at home and will notify me with BG's consistently higher then 180 mg/dl , to determine the next step in management.   MEDICATIONS:  N/A  EDUCATION / INSTRUCTIONS:  BG monitoring instructions: Patient is instructed to check his blood sugars 3 times a day, before meals .  Call [facility] if: BG persistently < 70   I reviewed the Rule of 15 for the treatment of hypoglycemia in detail with the patient. Literature supplied.   2) Diabetic complications:   Eye: Does not have known diabetic retinopathy. Pt urged to have an eye exam   Neuro/ Feet: Does  have known diabetic peripheral neuropathy based on exam 08/27/2020  Renal: Patient does not have known baseline CKD. He is not  on an ACEI/ARB at presen   3) Dyslipidemia: Patient is on atorvastatin 10 mg daily . Discussed cardiovascular benefits   4) Chronic diarrhea:   - He has had this for ~ 2 yrs , this is though to be due to pancreatic insufficiency, he is currently on Creon which has helped but given personal autoimmune diabetes as well as T1DM in his daughter and thyroid disease, I am going to screen him for celiac disease   -Celiac antibody testing has come back negative       Signed electronically by: Mack Guise, MD  [facility] Endocrinology  [facility] [address] [place], [place] [address] Phone: [phone] FAX: [phone]   CC: Tonia Ghent, MD [place] [address] Phone: [phone]  Fax: [phone]    Return to Endocrinology clinic as below: No  future appointments.

## 2020-08-28 LAB — CELIAC DISEASE AB SCREEN W/RFX
Antigliadin Abs, IgA: 2 units (ref 0–19)
IgA/Immunoglobulin A, Serum: 88 mg/dL (ref 61–437)
Transglutaminase IgA: <2 U/mL (ref 0–3)

## 2020-08-29 ENCOUNTER — Encounter: Payer: Self-pay | Admitting: Internal Medicine

## 2020-08-29 ENCOUNTER — Encounter: Payer: Self-pay | Admitting: Family Medicine

## 2020-08-29 LAB — LAB REPORT - SCANNED
A1c: 5.8
ALT: 15 (ref 3–30)
AST: 13
AST: 13
C-Peptide: 2.6
Creatinine, Ser: 0.64
Creatinine, Ser: 0.64
GAD65 Antibody: 89.7
Glucose: 121
Glucose: 121

## 2020-09-18 ENCOUNTER — Telehealth: Payer: Self-pay | Admitting: *Deleted

## 2020-09-18 NOTE — Telephone Encounter (Signed)
[employer] notified the office that this patient had not submitted his Cologuard test for screening.  Patient was contacted and said that he had done the IFOB but that he did not think he could do the Cologuard test because his stools are so loose.  Please see notation on [employer] Notification.

## 2020-09-19 NOTE — Telephone Encounter (Signed)
Duly noted.  I just don't see the IFOB results.  I will ask for lab input on this.  When did he do the IFOB?  Did he mail it here or drop it off?

## 2020-09-24 ENCOUNTER — Telehealth: Payer: Self-pay | Admitting: Family Medicine

## 2020-09-24 NOTE — Telephone Encounter (Signed)
See below.  I printed a letter for the patient.  Please mail to patient.  Thanks.     ================================== Jonette Eva, MD I spoke to the patient, he seems to think he did this test and has already received a negative result.       Previous Messages   ----- Message -----  From: Tonia Ghent, MD  Sent: 09/21/2020 11:14 PM EDT  To: Ellamae Sia   Please let me me know what you hear about the ifob on this patient.

## 2020-09-25 NOTE — Telephone Encounter (Signed)
I printed and mailed the letter to the patient per Dr. Damita Dunnings.  [name],cma

## 2020-10-30 DIAGNOSIS — E109 Type 1 diabetes mellitus without complications: Secondary | ICD-10-CM | POA: Diagnosis not present

## 2020-11-05 ENCOUNTER — Encounter (INDEPENDENT_AMBULATORY_CARE_PROVIDER_SITE_OTHER): Payer: BC Managed Care – PPO | Admitting: Ophthalmology

## 2020-11-05 DIAGNOSIS — H43813 Vitreous degeneration, bilateral: Secondary | ICD-10-CM | POA: Diagnosis not present

## 2020-11-05 DIAGNOSIS — E113313 Type 2 diabetes mellitus with moderate nonproliferative diabetic retinopathy with macular edema, bilateral: Secondary | ICD-10-CM

## 2020-11-05 DIAGNOSIS — D3132 Benign neoplasm of left choroid: Secondary | ICD-10-CM | POA: Diagnosis not present

## 2020-11-05 DIAGNOSIS — E11311 Type 2 diabetes mellitus with unspecified diabetic retinopathy with macular edema: Secondary | ICD-10-CM

## 2020-11-05 DIAGNOSIS — H2513 Age-related nuclear cataract, bilateral: Secondary | ICD-10-CM

## 2020-11-06 ENCOUNTER — Encounter (INDEPENDENT_AMBULATORY_CARE_PROVIDER_SITE_OTHER): Payer: BC Managed Care – PPO | Admitting: Ophthalmology

## 2020-11-06 ENCOUNTER — Other Ambulatory Visit: Payer: Self-pay

## 2020-11-06 DIAGNOSIS — E113311 Type 2 diabetes mellitus with moderate nonproliferative diabetic retinopathy with macular edema, right eye: Secondary | ICD-10-CM | POA: Diagnosis not present

## 2020-11-06 DIAGNOSIS — E11311 Type 2 diabetes mellitus with unspecified diabetic retinopathy with macular edema: Secondary | ICD-10-CM | POA: Diagnosis not present

## 2020-11-27 ENCOUNTER — Other Ambulatory Visit: Payer: Self-pay

## 2020-11-27 ENCOUNTER — Encounter (INDEPENDENT_AMBULATORY_CARE_PROVIDER_SITE_OTHER): Payer: BC Managed Care – PPO | Admitting: Ophthalmology

## 2020-11-27 DIAGNOSIS — E113312 Type 2 diabetes mellitus with moderate nonproliferative diabetic retinopathy with macular edema, left eye: Secondary | ICD-10-CM

## 2020-12-03 ENCOUNTER — Other Ambulatory Visit: Payer: Self-pay

## 2020-12-03 ENCOUNTER — Encounter (INDEPENDENT_AMBULATORY_CARE_PROVIDER_SITE_OTHER): Payer: BC Managed Care – PPO | Admitting: Ophthalmology

## 2020-12-03 DIAGNOSIS — H43813 Vitreous degeneration, bilateral: Secondary | ICD-10-CM

## 2020-12-03 DIAGNOSIS — E113313 Type 2 diabetes mellitus with moderate nonproliferative diabetic retinopathy with macular edema, bilateral: Secondary | ICD-10-CM

## 2020-12-03 DIAGNOSIS — D3132 Benign neoplasm of left choroid: Secondary | ICD-10-CM

## 2020-12-16 DIAGNOSIS — Z13228 Encounter for screening for other metabolic disorders: Secondary | ICD-10-CM | POA: Diagnosis not present

## 2020-12-29 ENCOUNTER — Encounter: Payer: Self-pay | Admitting: Internal Medicine

## 2020-12-29 ENCOUNTER — Ambulatory Visit: Payer: BC Managed Care – PPO | Admitting: Internal Medicine

## 2020-12-29 ENCOUNTER — Other Ambulatory Visit: Payer: Self-pay

## 2020-12-29 VITALS — BP 140/82 | HR 81 | Ht 71.0 in | Wt 215.8 lb

## 2020-12-29 DIAGNOSIS — E139 Other specified diabetes mellitus without complications: Secondary | ICD-10-CM

## 2020-12-29 DIAGNOSIS — R809 Proteinuria, unspecified: Secondary | ICD-10-CM | POA: Insufficient documentation

## 2020-12-29 LAB — POCT GLUCOSE (DEVICE FOR HOME USE): POC Glucose: 166 mg/dl — AB (ref 70–99)

## 2020-12-29 MED ORDER — LISINOPRIL 5 MG PO TABS: 5.0000 mg | ORAL_TABLET | Freq: Every day | ORAL | 3 refills | Status: DC

## 2020-12-29 NOTE — Patient Instructions (Signed)
-   Please start Lisinopril 5 mg daily ( for kidney protection )

## 2020-12-29 NOTE — Progress Notes (Signed)
Name: Stephen Shelton  Age/ Sex: 65 y.o., male   MRN/ DOB: [id number], Jun 08, 1956     PCP: Tonia Ghent, MD   Reason for Endocrinology Evaluation: Type 2 Diabetes Mellitus  Initial Endocrine Consultative Visit: 08/27/2020    PATIENT IDENTIFIER: Stephen Shelton is a 65 y.o. male with a past medical history of T2DM and HTN. The patient has followed with Endocrinology clinic since 08/27/2020 for consultative assistance with management of his diabetes.  DIABETIC HISTORY:  Stephen Shelton was diagnosed with T2DM in 2004, but this was changed to LADA by 06/2020 after he was found to have an elevated GAD-65 at 89.7 U/mL .    He was initially on Metformin but this  caused Abdominal pain. His hemoglobin A1c has ranged from 5.9% in 2021, peaking at 10.9% in 2019.   He was on insulin from 12/2017 until 12/2019, he was taken off insulin by  [facility] health program  ( which is a keto diet) and after losing   30 lbs .   He is on  Creon for pancreatic insufficiency , has been having loose stools since 2019years which has helped. Celiac test negative.   Maternal Grandfather with DM Father with DM  Daughter with T1DM and thyroid disease  SUBJECTIVE:   During the last visit (08/27/2020): A1c 5.7% No changes were made.     Today (12/29/2020): Stephen Shelton is here for a follow up on diabetes care.  He checks his blood sugars 3 times daily. The patient has  had hypoglycemic episodes since the last clinic visit x 1 around christmas.   He stopped creon temporaruily  2   HOME DIABETES REGIMEN:  N/A  Statin: yes ACE-I/ARB: no    METER DOWNLOAD SUMMARY: Did not bring     DIABETIC COMPLICATIONS: Microvascular complications:    Denies: CKD, retinopathy, neuropathy   Last Eye Exam: Completed 2017  Macrovascular complications:    Denies: CAD, CVA, PVD   HISTORY:  Past Medical History:  Past Medical History:  Diagnosis Date  . Diabetes mellitus ([place])   . Septic arthritis ([facility])    R  shoulder, history of  . Smoker     Past Surgical History:  Past Surgical History:  Procedure Laterality Date  . APPENDECTOMY    . septic arthritis shoulder       Social History:  reports that he has been smoking cigarettes. He has a 16.50 pack-year smoking history. He has never used smokeless tobacco. He reports current alcohol use. He reports that he does not use drugs. Family History:  Family History  Problem Relation Age of Onset  . Diabetes Father   . Autoimmune disease Father        liver and renal disease  . Cancer Mother        multiple myeloma  . Diabetes Maternal Grandfather   . Colon cancer Neg Hx   . Prostate cancer Neg Hx       HOME MEDICATIONS: Allergies as of 12/29/2020      Reactions   Metformin And Related Other (See Comments)   abd pain with med use   Sulfa Antibiotics Other (See Comments)   Lightheaded, intolerant, "felt sick"      Medication List       Accurate as of December 29, 2020 12:29 PM. If you have any questions, ask your nurse or doctor.        atorvastatin 10 MG tablet Commonly known as: LIPITOR Take 1 tablet (10 mg total)  by mouth daily.   Chantix Starting Month Pak 0.5 MG X 11 & 1 MG X 42 tablet Generic drug: varenicline Take one 0.5 mg tablet daily for 3 days, then one 0.5 mg tablet twice daily for 4 days, then increase to one 1 mg tablet twice daily.   varenicline 1 MG tablet Commonly known as: Chantix Continuing Month Pak Take 1 tablet (1 mg total) by mouth 2 (two) times daily.   lipase/protease/amylase 36000 UNITS Cpep capsule Commonly known as: Creon Take 1-2 capsules (36,000-72,000 Units total) by mouth 3 (three) times daily with meals. May also take 1 capsule (36,000 Units total) as needed (with snacks).   multivitamin tablet Take 1 tablet by mouth daily.        OBJECTIVE:   Vital Signs: There were no vitals taken for this visit.  Wt Readings from Last 3 Encounters:  08/27/20 205 lb 9.6 oz (93.3 kg)  06/09/20  205 lb (93 kg)  05/11/19 224 lb (101.6 kg)     Exam: General: Pt appears well and is in NAD  Lungs: Clear with good BS bilat  Heart: RRR   Abdomen: Normoactive bowel sounds, soft, nontender, without masses or organomegaly palpable  Extremities: No pretibial edema.  Neuro: MS is good with appropriate affect, pt is alert and Ox3   DM foot exam: 08/27/2020  The skin of the feet is intact without sores or ulcerations. The pedal pulses are 2+ on right and 2+ on left. The sensation is decreased  to a screening 5.07, 10 gram monofilament on the right     DATA REVIEWED:  Lab Results  Component Value Date   HGBA1C 5.7 (A) 08/27/2020   HGBA1C 5.9 06/09/2020   HGBA1C 7.1 (H) 05/09/2019   Lab Results  Component Value Date   MICROALBUR 3.1 (H) 06/09/2020   LDLCALC 80 06/09/2020   CREATININE 0.70 08/11/2020   Lab Results  Component Value Date   MICRALBCREAT 2.4 06/09/2020     Lab Results  Component Value Date   CHOL 142 06/09/2020   HDL 40.00 06/09/2020   LDLCALC 80 06/09/2020   TRIG 113.0 06/09/2020   CHOLHDL 4 06/09/2020       07/07/2020  Gluc 121 BUN/Cr 14/0.64 GFR 103 A1c 5.8%  C- peptide 2.6 ng/mL GAD-65 89.7 U/mL     12/16/2020 A1c 6.1 %  Gluc 128 mg/dL  BUN/Cr 18/0.76  GFR 96  LDL 89 mg/dL Tg 69  Alb/CR ratio 44   ASSESSMENT / PLAN / RECOMMENDATIONS:   1) 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 Goal A1c < 7.0 %.   He understands that eventually he will need insulin but at this time due to low carb diet, he is able to manage.  - He will continue to follow a strict keto diet at this time, he was advised to continue regular glucose check at home and will notify me with BG's consistently higher then 180 mg/dl , to determine the next step in management.   MEDICATIONS:  N/A  EDUCATION / INSTRUCTIONS:  BG monitoring instructions: Patient is instructed to check his blood  sugars 3 times a day, before meals .  Call [facility] if: BG persistently < 70  . I reviewed the Rule of 15 for the treatment of hypoglycemia in detail with the patient. Literature supplied.     2) Diabetic complications:   Eye: Does not have known diabetic retinopathy.   Neuro/  Feet: Does  have known diabetic peripheral neuropathy .   Renal: Patient does not have known baseline CKD. He   Will be started on an ACEI   3) Microalbuminuria:   - Slight elevation at 44 mg / gram. I have recommended starting lisinopril, which he is in agreements - Discussed risk of allergic reaction and angioedema   Medication  Lisinopril 5 mg daily      F/U in 6 months     Signed electronically by: Abby Jaralla Shamleffer, MD  [facility]  [facility] [address] [place], [place] [address] Phone: [phone] FAX: [phone]   CC: Duncan, Graham S, MD [address] [place] [address] Phone: [phone]  Fax: [phone]  Return to Endocrinology clinic as below: Future Appointments  Date Time Provider Department Center  12/29/2020  2:00 PM Shamleffer, Ibtehal Jaralla, MD [facility] None  12/30/2020  2:30 PM Matthews, John D, MD [facility] None  06/04/2021  8:10 AM [facility] LAB [facility]  06/11/2021  9:00 AM Duncan, Graham S, MD [facility]      

## 2020-12-30 ENCOUNTER — Encounter (INDEPENDENT_AMBULATORY_CARE_PROVIDER_SITE_OTHER): Payer: BC Managed Care – PPO | Admitting: Ophthalmology

## 2020-12-31 ENCOUNTER — Other Ambulatory Visit: Payer: Self-pay | Admitting: Family Medicine

## 2021-01-01 ENCOUNTER — Encounter: Payer: BC Managed Care – PPO | Admitting: Family Medicine

## 2021-01-05 ENCOUNTER — Telehealth: Payer: Self-pay

## 2021-01-05 NOTE — Telephone Encounter (Signed)
[facility] left v[facility]m that pt needed PA for Creon. This is a FU about fax sent to [facility] about the PA for Creon being needed per [employer]. Can call [employer] in re: to Creon PA at [phone]. Sending note to Sherrilee Gilles CMA.

## 2021-01-05 NOTE — Telephone Encounter (Signed)
PA has been done and approved from 01/05/2021 to 01/04/2022. Key: [id number]

## 2021-01-06 NOTE — Telephone Encounter (Signed)
[facility] is on back order and there is no release date for when this will be back. Please advise.

## 2021-01-07 MED ORDER — PANCRELIPASE (LIP-PROT-AMYL) 36000-114000 UNITS PO CPEP: ORAL_CAPSULE | ORAL | 3 refills | Status: DC

## 2021-01-07 NOTE — Telephone Encounter (Signed)
Spoke with patient and he is okay [facility]  [address]; rx sent.

## 2021-01-07 NOTE — Addendum Note (Signed)
Addended by: Sherrilee Gilles B on: 01/07/2021 02:47 PM   Modules accepted: Orders

## 2021-01-07 NOTE — Telephone Encounter (Signed)
Do they have any other generic form or similar substitution?  Are they aware of any other pharmacies having this in the area?  Please let me know.  Thanks.

## 2021-01-07 NOTE — Telephone Encounter (Signed)
[facility] and pharmacist would need to know what else you would try to send and would depend on if that was covered by patients insurance. He could not give me any ideas on different medications. I asked if he knew of any other pharmacies that have this medication and [facility] [address] [facility] in [facility]. [facility]  [address] to verify they indeed [facility] the rx and they do. I called patient to see if it would be okay to send rx there but there was no answer on cell phone and VM is not set up; called home number and line just rings and rings.

## 2021-01-12 ENCOUNTER — Other Ambulatory Visit: Payer: Self-pay

## 2021-01-12 ENCOUNTER — Encounter (INDEPENDENT_AMBULATORY_CARE_PROVIDER_SITE_OTHER): Payer: BC Managed Care – PPO | Admitting: Ophthalmology

## 2021-01-12 DIAGNOSIS — D3132 Benign neoplasm of left choroid: Secondary | ICD-10-CM | POA: Diagnosis not present

## 2021-01-12 DIAGNOSIS — H43813 Vitreous degeneration, bilateral: Secondary | ICD-10-CM

## 2021-01-12 DIAGNOSIS — E113313 Type 2 diabetes mellitus with moderate nonproliferative diabetic retinopathy with macular edema, bilateral: Secondary | ICD-10-CM | POA: Diagnosis not present

## 2021-01-28 DIAGNOSIS — E118 Type 2 diabetes mellitus with unspecified complications: Secondary | ICD-10-CM | POA: Diagnosis not present

## 2021-02-09 ENCOUNTER — Encounter (INDEPENDENT_AMBULATORY_CARE_PROVIDER_SITE_OTHER): Payer: BC Managed Care – PPO | Admitting: Ophthalmology

## 2021-02-09 ENCOUNTER — Other Ambulatory Visit: Payer: Self-pay

## 2021-02-09 DIAGNOSIS — D3132 Benign neoplasm of left choroid: Secondary | ICD-10-CM | POA: Diagnosis not present

## 2021-02-09 DIAGNOSIS — E113311 Type 2 diabetes mellitus with moderate nonproliferative diabetic retinopathy with macular edema, right eye: Secondary | ICD-10-CM | POA: Diagnosis not present

## 2021-02-09 DIAGNOSIS — H43813 Vitreous degeneration, bilateral: Secondary | ICD-10-CM | POA: Diagnosis not present

## 2021-02-09 DIAGNOSIS — E113392 Type 2 diabetes mellitus with moderate nonproliferative diabetic retinopathy without macular edema, left eye: Secondary | ICD-10-CM

## 2021-03-16 ENCOUNTER — Encounter (INDEPENDENT_AMBULATORY_CARE_PROVIDER_SITE_OTHER): Payer: BC Managed Care – PPO | Admitting: Ophthalmology

## 2021-03-16 ENCOUNTER — Other Ambulatory Visit: Payer: Self-pay

## 2021-03-16 DIAGNOSIS — I10 Essential (primary) hypertension: Secondary | ICD-10-CM

## 2021-03-16 DIAGNOSIS — E113392 Type 2 diabetes mellitus with moderate nonproliferative diabetic retinopathy without macular edema, left eye: Secondary | ICD-10-CM | POA: Diagnosis not present

## 2021-03-16 DIAGNOSIS — D3132 Benign neoplasm of left choroid: Secondary | ICD-10-CM | POA: Diagnosis not present

## 2021-03-16 DIAGNOSIS — H43813 Vitreous degeneration, bilateral: Secondary | ICD-10-CM

## 2021-03-16 DIAGNOSIS — E113311 Type 2 diabetes mellitus with moderate nonproliferative diabetic retinopathy with macular edema, right eye: Secondary | ICD-10-CM | POA: Diagnosis not present

## 2021-03-16 DIAGNOSIS — H35033 Hypertensive retinopathy, bilateral: Secondary | ICD-10-CM

## 2021-04-15 ENCOUNTER — Other Ambulatory Visit: Payer: Self-pay

## 2021-04-15 ENCOUNTER — Encounter (INDEPENDENT_AMBULATORY_CARE_PROVIDER_SITE_OTHER): Payer: BC Managed Care – PPO | Admitting: Ophthalmology

## 2021-04-15 DIAGNOSIS — D3132 Benign neoplasm of left choroid: Secondary | ICD-10-CM | POA: Diagnosis not present

## 2021-04-15 DIAGNOSIS — H43813 Vitreous degeneration, bilateral: Secondary | ICD-10-CM | POA: Diagnosis not present

## 2021-04-15 DIAGNOSIS — E113311 Type 2 diabetes mellitus with moderate nonproliferative diabetic retinopathy with macular edema, right eye: Secondary | ICD-10-CM

## 2021-04-15 DIAGNOSIS — E113392 Type 2 diabetes mellitus with moderate nonproliferative diabetic retinopathy without macular edema, left eye: Secondary | ICD-10-CM

## 2021-05-13 ENCOUNTER — Encounter (INDEPENDENT_AMBULATORY_CARE_PROVIDER_SITE_OTHER): Payer: BC Managed Care – PPO | Admitting: Ophthalmology

## 2021-05-21 ENCOUNTER — Encounter (INDEPENDENT_AMBULATORY_CARE_PROVIDER_SITE_OTHER): Payer: Medicare Other | Admitting: Ophthalmology

## 2021-05-21 ENCOUNTER — Other Ambulatory Visit: Payer: Self-pay

## 2021-05-21 DIAGNOSIS — H43813 Vitreous degeneration, bilateral: Secondary | ICD-10-CM | POA: Diagnosis not present

## 2021-05-21 DIAGNOSIS — E113392 Type 2 diabetes mellitus with moderate nonproliferative diabetic retinopathy without macular edema, left eye: Secondary | ICD-10-CM

## 2021-05-21 DIAGNOSIS — D3132 Benign neoplasm of left choroid: Secondary | ICD-10-CM

## 2021-05-21 DIAGNOSIS — I10 Essential (primary) hypertension: Secondary | ICD-10-CM

## 2021-05-21 DIAGNOSIS — E113311 Type 2 diabetes mellitus with moderate nonproliferative diabetic retinopathy with macular edema, right eye: Secondary | ICD-10-CM

## 2021-05-31 ENCOUNTER — Other Ambulatory Visit: Payer: Self-pay | Admitting: Family Medicine

## 2021-05-31 DIAGNOSIS — E119 Type 2 diabetes mellitus without complications: Secondary | ICD-10-CM

## 2021-06-02 ENCOUNTER — Other Ambulatory Visit: Payer: Self-pay

## 2021-06-02 ENCOUNTER — Encounter: Payer: Self-pay | Admitting: Emergency Medicine

## 2021-06-02 ENCOUNTER — Ambulatory Visit
Admission: EM | Admit: 2021-06-02 | Discharge: 2021-06-02 | Disposition: A | Discharge status: Home / Self Care | Payer: HMO | Attending: Family Medicine | Admitting: Family Medicine

## 2021-06-02 DIAGNOSIS — R110 Nausea: Secondary | ICD-10-CM | POA: Diagnosis not present

## 2021-06-02 DIAGNOSIS — R109 Unspecified abdominal pain: Secondary | ICD-10-CM | POA: Insufficient documentation

## 2021-06-02 DIAGNOSIS — R339 Retention of urine, unspecified: Secondary | ICD-10-CM | POA: Insufficient documentation

## 2021-06-02 DIAGNOSIS — K5900 Constipation, unspecified: Secondary | ICD-10-CM | POA: Insufficient documentation

## 2021-06-02 HISTORY — DX: Essential (primary) hypertension: I10

## 2021-06-02 LAB — POCT URINALYSIS DIP (MANUAL ENTRY)
Bilirubin, UA: NEGATIVE
Blood, UA: NEGATIVE
Glucose, UA: NEGATIVE mg/dL
Ketones, POC UA: NEGATIVE mg/dL
Nitrite, UA: NEGATIVE
Protein Ur, POC: NEGATIVE mg/dL
Spec Grav, UA: 1.025 (ref 1.010–1.025)
Urobilinogen, UA: 1 E.U./dL
pH, UA: 6.5 (ref 5.0–8.0)

## 2021-06-02 LAB — POCT FASTING CBG KUC MANUAL ENTRY: POCT Glucose (KUC): 172 mg/dL — AB (ref 70–99)

## 2021-06-02 MED ORDER — TAMSULOSIN HCL 0.4 MG PO CAPS: 0.4000 mg | ORAL_CAPSULE | Freq: Every day | ORAL | 0 refills | Status: DC

## 2021-06-02 MED ORDER — POLYETHYLENE GLYCOL 3350 17 G PO PACK: 17.0000 g | PACK | Freq: Every day | ORAL | 0 refills | Status: DC | PRN

## 2021-06-02 MED ORDER — TIZANIDINE HCL 4 MG PO TABS: 4.0000 mg | ORAL_TABLET | Freq: Every day | ORAL | 0 refills | Status: DC

## 2021-06-02 MED ORDER — ONDANSETRON 4 MG PO TBDP: 4.0000 mg | ORAL_TABLET | Freq: Three times a day (TID) | ORAL | 0 refills | Status: DC | PRN

## 2021-06-02 NOTE — Discharge Instructions (Addendum)
Your blood pressure is elevated , take your medication once you are able to eat food.  Start Flomax to help improve flow of urine.   Start Miralax at bedtime once daily until stool improves.  Tizanidine 4 mg at bedtime for flank pain.  Increase water intake.

## 2021-06-02 NOTE — ED Provider Notes (Signed)
[facility]    CSN: [id number] Arrival date & time: 06/02/21  0908      History   Chief Complaint Chief Complaint  Patient presents with   Nausea    HPI MIHRAN LEBARRON is a 65 y.o. male.   HPI Patient with a known history of type 2 diabetes, pancreatic insufficiency, enlarged prostate, hypertension presents today with 4 days of urinary retention, left flank pain, and constipation with nausea.  Symptoms have gradually progressed over the last 4 days.  Patient denies noticing any blood in his urine.  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  Patient takes Creon daily and reports his bowel movements are normally loose however over the last 4 days he has had diminished appetite and has been unable to pass a stool.  He has been taking ibuprofen for management of flank pain which has taken the edge off however has not completely resolved the pain.  He has experienced nausea although denies any vomiting. 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   Aortic atherosclerosis ([place]) 08/14/2020   Pancreatic insufficiency 06/29/2020   Loose stools 06/11/2020   Routine general medical examination at a health care facility 05/13/2019   Groin pain 09/12/2018   Advance care planning 03/20/2015   Colon cancer screening 09/09/2012   AK (actinic keratosis) 06/02/2012   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 Authorizing Provider  atorvastatin (LIPITOR) 10 MG tablet Take 1 tablet (10 mg total) by mouth daily. 06/09/20   Tonia Ghent, MD  lipase/protease/amylase (CREON) 36000 UNITS CPEP capsule TAKE 1-2 CAPSULES BY MOUTH 3 TIMES DAILY WITH MEALS.MAY ALSO TAKE 1 CAPSULE AS NEEDED W/ SNACKS 01/07/21   Tonia Ghent, MD  lisinopril (ZESTRIL) 5 MG tablet Take 1 tablet (5 mg total) by mouth daily. 12/29/20   Shamleffer, Melanie Crazier, MD  Multiple Vitamin (MULTIVITAMIN) tablet Take 1 tablet by mouth daily.    [provider]  varenicline (CHANTIX CONTINUING MONTH PAK) 1 MG tablet Take 1 tablet (1 mg total) by mouth 2 (two) times daily. 06/09/20   Tonia Ghent, MD  varenicline (CHANTIX STARTING MONTH PAK) 0.5 MG X 11 & 1 MG X 42 tablet Take one 0.5 mg tablet daily for 3 days, then one 0.5 mg tablet twice daily for 4 days, then increase to one 1 mg tablet twice daily. 06/09/20   Tonia Ghent, MD    Family History Family History  Problem Relation Age of Onset   Diabetes Father    Autoimmune disease Father        liver and renal disease   Cancer Mother  multiple myeloma   Diabetes Maternal Grandfather    Colon cancer Neg Hx    Prostate cancer Neg Hx     Social History Social History   Tobacco Use   Smoking status: Every Day    Packs/day: 0.50    Years: 33.00    Pack years: 16.50    Types: Cigarettes   Smokeless tobacco: Never  Substance Use Topics   Alcohol use: Yes    Alcohol/week: 0.0 standard drinks    Comment: rarely   Drug use: No     Allergies   Metformin and related and Sulfa antibiotics   Review of Systems Review of Systems Pertinent negatives listed in HPI   Physical Exam Triage Vital Signs ED Triage Vitals [06/02/21 0918]  Enc  Vitals Group     BP (!) 186/102     Pulse Rate 92     Resp 17     Temp 97.8 F (36.6 C)     Temp Source Oral     SpO2 98 %     Weight      Height      Head Circumference      Peak Flow      Pain Score 7     Pain Loc      Pain Edu?      Excl. in [place]?    No data found.  Updated Vital Signs BP (!) 186/102 (BP Location: Right Arm)   Pulse 92   Temp 97.8 F (36.6 C) (Oral)   Resp 17   SpO2 98%   Visual Acuity Right Eye Distance:   Left Eye Distance:   Bilateral Distance:    Right Eye Near:   Left Eye Near:    Bilateral Near:     Physical Exam General appearance: alert, well developed, well nourished, cooperative  Head: Normocephalic, without obvious abnormality, atraumatic Respiratory: Respirations even and unlabored, normal respiratory rate Heart: Rate and rhythm normal. No gallop or murmurs noted on exam  Abdomen: BS +, no distention, no rebound tenderness, or negative CVA tenderness Extremities: No gross deformities Skin: Skin color, texture, turgor normal. No rashes seen  Psych: Appropriate mood and affect. Neurologic: No focal deficits present.  Normal gait UC Treatments / Results  Labs (all labs ordered are listed, but only abnormal results are displayed) Labs Reviewed  POCT FASTING CBG [facility] MANUAL ENTRY - Abnormal; Notable for the following components:      Result Value   POCT Glucose ([facility]) 172 (*)    All other components within normal limits  POCT URINALYSIS DIP (MANUAL ENTRY) - Abnormal; Notable for the following components:   Leukocytes, UA Trace (*)    All other components within normal limits    EKG   Radiology No results found.  Procedures Procedures (including critical care time)  Medications Ordered in UC Medications - No data to display  Initial Impression / Assessment and Plan / UC Course  I have reviewed the triage vital signs and the nursing notes.  Pertinent labs & imaging results that were available during my care of the patient  were reviewed by me and considered in my medical decision making (see chart for details).    Patient's physical exam is overall unremarkable.  His abdominal exam is negative for any peritoneal signs and he has no CVA tenderness.  He does have trace leuks in his urine therefore will culture urine.  Glucose is slightly elevated however no indication of any DKA. Given history of enlargement of  the prostate suspect symptoms of urine retention is likely evolving from a prostate issue opposed to a renal stone.  Patient was given a container to strain his urine just in case any sediment is present.  Zofran for management of nausea and vomiting.  Patient has an appointment with his primary care doctor within the next 10 days advised as long as symptoms do not get worse or he does not develop any severe abdominal pain or experienced any bleeding with urination or the inability to urinate is fine to follow-up with his primary doctor if any of his symptoms become very severe go to the nearest emergency department for further work-up and evaluation.  Patient blood pressure is also very elevated this morning however he has not taken any of his blood pressure medication and will take upon arrival back at his home.  Patient verbalized understanding agreement with plan today Final Clinical Impressions(s) / UC Diagnoses   Final diagnoses:  Left flank pain  Urinary retention with incomplete bladder emptying  Constipation, unspecified constipation type  Nausea without vomiting     Discharge Instructions      Your blood pressure is elevated , take your medication once you are able to eat food.  Start Flomax to help improve flow of urine.   Start Miralax at bedtime once daily until stool improves.  Tizanidine 4 mg at bedtime for flank pain.  Increase water intake.     ED Prescriptions     Medication Sig Dispense Auth. Provider   tamsulosin (FLOMAX) 0.4 MG CAPS capsule Take 1 capsule (0.4 mg total) by  mouth daily after breakfast. 20 capsule Scot Jun, FNP   ondansetron (ZOFRAN-ODT) 4 MG disintegrating tablet Take 1 tablet (4 mg total) by mouth every 8 (eight) hours as needed for nausea or vomiting. 20 tablet Scot Jun, FNP   polyethylene glycol (MIRALAX) 17 g packet Take 17 g by mouth daily as needed. 14 each Scot Jun, FNP   tiZANidine (ZANAFLEX) 4 MG tablet Take 1 tablet (4 mg total) by mouth at bedtime. 20 tablet Scot Jun, FNP      PDMP not reviewed this encounter.   Scot Jun, FNP 06/02/21 1424

## 2021-06-02 NOTE — ED Triage Notes (Signed)
LT flank pain that radiates to LLQ, nausea, difficulty urinating, urinary frequency and constipation x 4 days.

## 2021-06-04 ENCOUNTER — Other Ambulatory Visit (INDEPENDENT_AMBULATORY_CARE_PROVIDER_SITE_OTHER): Payer: HMO

## 2021-06-04 ENCOUNTER — Telehealth: Payer: Self-pay

## 2021-06-04 ENCOUNTER — Other Ambulatory Visit: Payer: Self-pay

## 2021-06-04 DIAGNOSIS — E119 Type 2 diabetes mellitus without complications: Secondary | ICD-10-CM

## 2021-06-04 LAB — LIPID PANEL
Cholesterol: 91 mg/dL (ref 0–200)
HDL: 37.7 mg/dL — ABNORMAL LOW (ref >39.00)
LDL Cholesterol: 42 mg/dL (ref 0–99)
NonHDL: 53.67
Total CHOL/HDL Ratio: 2
Triglycerides: 59 mg/dL (ref 0.0–149.0)
VLDL: 11.8 mg/dL (ref 0.0–40.0)

## 2021-06-04 LAB — COMPREHENSIVE METABOLIC PANEL
ALT: 17 U/L (ref 0–53)
AST: 16 U/L (ref 0–37)
Albumin: 4.5 g/dL (ref 3.5–5.2)
Alkaline Phosphatase: 55 U/L (ref 39–117)
BUN: 22 mg/dL (ref 6–23)
CO2: 26 mEq/L (ref 19–32)
Calcium: 9.7 mg/dL (ref 8.4–10.5)
Chloride: 104 mEq/L (ref 96–112)
Creatinine, Ser: 0.83 mg/dL (ref 0.40–1.50)
GFR: 92.1 mL/min (ref >60.00)
Glucose, Bld: 149 mg/dL — ABNORMAL HIGH (ref 70–99)
Potassium: 4.4 mEq/L (ref 3.5–5.1)
Sodium: 138 mEq/L (ref 135–145)
Total Bilirubin: 0.7 mg/dL (ref 0.2–1.2)
Total Protein: 7 g/dL (ref 6.0–8.3)

## 2021-06-04 LAB — HEMOGLOBIN A1C: Hgb A1c MFr Bld: 6.9 % — ABNORMAL HIGH (ref 4.6–6.5)

## 2021-06-04 LAB — MICROALBUMIN / CREATININE URINE RATIO
Creatinine,U: 179.8 mg/dL
Microalb Creat Ratio: 2 mg/g (ref 0.0–30.0)
Microalb, Ur: 3.6 mg/dL — ABNORMAL HIGH (ref 0.0–1.9)

## 2021-06-04 MED ORDER — CEPHALEXIN 500 MG PO CAPS: 500.0000 mg | ORAL_CAPSULE | Freq: Four times a day (QID) | ORAL | 0 refills | Status: DC

## 2021-06-04 NOTE — Telephone Encounter (Addendum)
Patient called and informed of Dr. Carole Civil notes and he stated that he is still having pain in his back. Patient states that he is able to release gas but having issues urinating, patient is awaiting the final result of UA Culture. Patient states that he will also go and pick up his medication that Dr. Damita Dunnings prescribed and start taking them.

## 2021-06-04 NOTE — Telephone Encounter (Signed)
If he doesn't have fever, if he is still passing gas, and doesn't have severe/progressive pain, then I get the point about awaiting the culture.  However, if fever, more pain, or unable to even pass gas then would need eval asap.   In the meantime, he has partial results on ux.  Would start keflex.  Rx sent.  We may need to change rx based on sensitivities that are still pending.    Routed to Claudine Mouton, who ordered the ucx.  Thanks.

## 2021-06-04 NOTE — Telephone Encounter (Signed)
Patient called stating he went to Urgent Care in [place] (in the chart) on 06/02/21 for constipation, urinary retention, left flank pain. HE was prescribed Zofran, Miralax, Tizanidine and Tamsulosin. Patient states nausea, back pain is not better with taking medications. He has not had a bowel movement but once in the last week. He is not worse but not better. Per chart urine culture results still pending. Discussed this with Dr Einar Pheasant since she was the only one that had an appointment today. Dr Einar Pheasant suggested patient goes to ER due to with ongoing symptoms since urgent care did not do any scans for his symptoms and that would be the next step. I advised patient of this. Patient states he is not any worse and wants to wait on the urine culture. ER precautions were given in case symptoms get worse and patient will continue to take medications he was prescribed.  Advised patient if Dr Damita Dunnings had any other suggestions he would let us know and we would call back.

## 2021-06-05 LAB — URINE CULTURE: Culture: >=100000 — AB

## 2021-06-05 NOTE — Telephone Encounter (Signed)
Noted. Still awaiting Ucx sensitivity report.

## 2021-06-07 NOTE — Telephone Encounter (Signed)
Notify patient.  Urine culture positive.  Keflex should cover the bacteria.  Finish the Keflex prescription.  Update [place] as needed.  Thanks.

## 2021-06-09 NOTE — Telephone Encounter (Signed)
Patient notified of results and advised to continue keflex. Patient has about two more days left on rx. States he is doing much better then he was. He has a f/u appt on 06/11/21 with Dr. Damita Dunnings.

## 2021-06-11 ENCOUNTER — Encounter: Payer: Self-pay | Admitting: Family Medicine

## 2021-06-11 ENCOUNTER — Ambulatory Visit (INDEPENDENT_AMBULATORY_CARE_PROVIDER_SITE_OTHER): Payer: HMO | Admitting: Family Medicine

## 2021-06-11 ENCOUNTER — Other Ambulatory Visit: Payer: Self-pay

## 2021-06-11 VITALS — BP 140/80 | HR 84 | Temp 98.3°F | Ht 71.0 in | Wt 209.0 lb

## 2021-06-11 DIAGNOSIS — E139 Other specified diabetes mellitus without complications: Secondary | ICD-10-CM | POA: Diagnosis not present

## 2021-06-11 DIAGNOSIS — Z7189 Other specified counseling: Secondary | ICD-10-CM

## 2021-06-11 DIAGNOSIS — N3001 Acute cystitis with hematuria: Secondary | ICD-10-CM

## 2021-06-11 DIAGNOSIS — Z0000 Encounter for general adult medical examination without abnormal findings: Secondary | ICD-10-CM

## 2021-06-11 DIAGNOSIS — K8689 Other specified diseases of pancreas: Secondary | ICD-10-CM | POA: Diagnosis not present

## 2021-06-11 DIAGNOSIS — N3000 Acute cystitis without hematuria: Secondary | ICD-10-CM

## 2021-06-11 DIAGNOSIS — Z23 Encounter for immunization: Secondary | ICD-10-CM

## 2021-06-11 DIAGNOSIS — Z1211 Encounter for screening for malignant neoplasm of colon: Secondary | ICD-10-CM

## 2021-06-11 DIAGNOSIS — E786 Lipoprotein deficiency: Secondary | ICD-10-CM | POA: Diagnosis not present

## 2021-06-11 NOTE — Progress Notes (Signed)
I have personally reviewed the Medicare Annual Wellness questionnaire and have noted 1. The patient's medical and social history 2. Their use of alcohol, tobacco or illicit drugs 3. Their current medications and supplements 4. The patient's functional ability including ADL's, fall risks, home safety risks and hearing or visual             impairment. 5. Diet and physical activities 6. Evidence for depression or mood disorders  The patients weight, height, BMI have been recorded in the chart and visual acuity is per eye clinic.  I have made referrals, counseling and provided education to the patient based review of the above and I have provided the pt with a written personalized care plan for preventive services.  Provider list updated- see scanned forms.  Routine anticipatory guidance given to patient.  See health maintenance. The possibility exists that previously documented standard health maintenance information may have been brought forward from a previous encounter into this note.  If needed, that same information has been updated to reflect the current situation based on today's encounter.    Flu yearly Shingles discussed with patient, it may be cheaper the pharmacy PNA-20 done at office visit 2022 Tetanus 2020 COVID-vaccine previously Colon cancer screening pending with Cologuard 2022  Prostate cancer screening and PSA options (with potential risks and benefits of testing vs not testing) were discussed along with recent recs/guidelines.  He declined testing PSA at this point. Advance directive-wife designated patient if patient were incapacitated.   Cognitive function addressed- see scanned forms- and if abnormal then additional documentation follows.   EKG done and reviewed.  See notes on results. Hearing and vision screen done at visit.  Smoking ~1/2 PPD.  Encouraged cessation, he'll consider.   In addition to [facility], follow up visit for the below conditions:  H/o  Ucx positive, sens to keflex, finishing abx.  No fevers.  Back pain improved.  D/w pt.    Diabetes:  No meds except creon, on low carb diet.   Hypoglycemic episodes:no Hyperglycemic episodes:no Feet problems:no Blood Sugars averaging: 105-110 in the AMs.   eye exam within last year: yes Labs d/w pt.    Low HDL. Using medications without problems: yes Muscle aches: no Diet compliance: yes Exercise: yes  History of pancreatic insufficiency.  GI symptoms improved with addition of Creon.  Discussed.  PMH and SH reviewed  Meds, vitals, and allergies reviewed.   ROS: Per HPI.  Unless specifically indicated otherwise in HPI, the patient denies:  General: fever. Eyes: acute vision changes ENT: sore throat Cardiovascular: chest pain Respiratory: SOB GI: vomiting GU: dysuria Musculoskeletal: acute back pain Derm: acute rash Neuro: acute motor dysfunction Psych: worsening mood Endocrine: polydipsia Heme: bleeding Allergy: hayfever  GEN: nad, alert and oriented HEENT: ncat NECK: supple w/o LA CV: rrr. PULM: ctab, no inc wob ABD: soft, +bs EXT: no edema SKIN: no acute rash  Diabetic foot exam: Normal inspection No skin breakdown No calluses  Normal DP pulses Normal sensation to light touch and monofilament Nails normal

## 2021-06-11 NOTE — Patient Instructions (Signed)
Plan on recheck here in 1 year.  I will await the endocrine notes.   Take care.  Glad to see you. Thanks for your effort.

## 2021-06-14 DIAGNOSIS — N390 Urinary tract infection, site not specified: Secondary | ICD-10-CM | POA: Insufficient documentation

## 2021-06-14 NOTE — Assessment & Plan Note (Signed)
Improved.  He will finish Keflex and update me as needed.  Reasonable not to check PSA given recent UTI.  Discussed.  He agrees.

## 2021-06-14 NOTE — Assessment & Plan Note (Signed)
Advance directive-wife designated patient if patient were incapacitated.

## 2021-06-14 NOTE — Assessment & Plan Note (Signed)
Flu yearly Shingles discussed with patient, it may be cheaper the pharmacy PNA-20 done at office visit 2022 Tetanus 2020 COVID-vaccine previously Colon cancer screening pending with Cologuard 2022  Prostate cancer screening and PSA options (with potential risks and benefits of testing vs not testing) were discussed along with recent recs/guidelines.  He declined testing PSA at this point. Advance directive-wife designated patient if patient were incapacitated.   Cognitive function addressed- see scanned forms- and if abnormal then additional documentation follows.   EKG done and reviewed.  See notes on results. Hearing and vision screen done at visit.  Smoking ~1/2 PPD.  Encouraged cessation, he'll consider.

## 2021-06-14 NOTE — Assessment & Plan Note (Signed)
-  Continue Creon °

## 2021-06-14 NOTE — Assessment & Plan Note (Signed)
A1c controlled.  He is controlling this with strict low carbohydrate diet.  On ACE inhibitor.  Would continue given microalbumin.  Rationale discussed with patient.  He agrees with plan.

## 2021-06-14 NOTE — Assessment & Plan Note (Signed)
Continue atorvastatin.  Continue work on diet and exercise. 

## 2021-06-18 ENCOUNTER — Encounter: Payer: Self-pay | Admitting: Family Medicine

## 2021-06-19 ENCOUNTER — Telehealth: Payer: Self-pay | Admitting: Family Medicine

## 2021-06-19 MED ORDER — CEPHALEXIN 500 MG PO CAPS: 500.0000 mg | ORAL_CAPSULE | Freq: Four times a day (QID) | ORAL | 0 refills | Status: DC

## 2021-06-19 NOTE — Telephone Encounter (Addendum)
Dr. Damita Dunnings, I finished my antibiotic last Thursday 06/11/21 for my Kidney infection. My back pain is still lingering, it is worse during my sleeping hours and usually wakes me up around 1:00 in the morning. What should I do?  I spoke with pt and he said when finished abx on 06/11/21 still had nagging dull constant pain on lt lower back. Pain is worse during the night while pt is trying to sleep. Now burning when starts to urinate; no frequency and no urgency but pt not voiding usual amt of urine and on and off urine is darker yellow than usual. 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 No hx of kidney stone. [facility]. UC & ED precautions given and pt voiced understanding.Pt request cb after reviewed by DR Damita Dunnings. No available appts at [facility] today.please advise. Also sending teams note to Dr Damita Dunnings.

## 2021-06-19 NOTE — Telephone Encounter (Signed)
See mychart message, please triage patient about his pain.  See about any urinary sx.  Thanks.

## 2021-06-19 NOTE — Telephone Encounter (Signed)
Patient is starting to have some dysuria again and has been advised to restart abx. Rx has been sent already. Advised is he gets worse to seek eval.

## 2021-06-19 NOTE — Telephone Encounter (Signed)
If any dysuria, then I would restart the abx.  Rx sent.  If worse in the meantime, then seek eval but I expect improvement.  Thanks.

## 2021-06-24 ENCOUNTER — Ambulatory Visit (INDEPENDENT_AMBULATORY_CARE_PROVIDER_SITE_OTHER): Payer: HMO | Admitting: Internal Medicine

## 2021-06-24 ENCOUNTER — Other Ambulatory Visit: Payer: Self-pay

## 2021-06-24 VITALS — BP 144/92 | HR 72 | Ht 71.0 in | Wt 208.0 lb

## 2021-06-24 DIAGNOSIS — R809 Proteinuria, unspecified: Secondary | ICD-10-CM

## 2021-06-24 DIAGNOSIS — E139 Other specified diabetes mellitus without complications: Secondary | ICD-10-CM

## 2021-06-24 NOTE — Progress Notes (Signed)
Name: Stephen Shelton  Age/ Sex: 65 y.o., male   MRN/ DOB: [id number], 25-Jan-1956     PCP: Tonia Ghent, MD   Reason for Endocrinology Evaluation: Type 2 Diabetes Mellitus  Initial Endocrine Consultative Visit: 08/27/2020    PATIENT IDENTIFIER: Stephen Shelton is a 65 y.o. male with a past medical history of T2DM and HTN. The patient has followed with Endocrinology clinic since 08/27/2020 for consultative assistance with management of his diabetes.  DIABETIC HISTORY:  Stephen Shelton was diagnosed with T2DM in 2004, but this was changed to LADA by 06/2020 after he was found to have an elevated GAD-65 at 89.7 U/mL .   He was also checked for C-Peptide in 06/2020 at 2.6 ng/mL with serum glucose 121 mg/dL    He was initially on Metformin but this  caused Abdominal pain. His hemoglobin A1c has ranged from 5.9% in 2021, peaking at 10.9% in 2019.   He was on insulin from 12/2017 until 12/2019, he was taken off insulin by  [facility] health program  ( which is a keto diet) and after losing   30 lbs .    He is on  Creon for pancreatic insufficiency , has been having loose stools since 2019 years which has helped. Celiac test negative.    Maternal Grandfather with DM Father with DM  Daughter with T1DM and thyroid disease  SUBJECTIVE:   During the last visit (12/29/2020): A1c 6.1 % No changes were made.     Today (06/24/2021): Stephen Shelton is here for a follow up on diabetes care.  He checks his blood sugars 2 times daily. The patient has  had  not hypoglycemic episodes since the last clinic visit    His A1c has increased from 5.7 % to 6.9% Pt attributes this to some dietary indiscretions  but most of the time he tries to limit CHO intake to 30 grams a day.    Denies abdominal pain , rare queasy feeling in morning Has recent renal infection on second course of Abx , has left flank pain only at night   He is fasting today except for black coffee Has ketone strips   HOME DIABETES REGIMEN:   N/A  Statin: yes ACE-I/ARB: no    METER DOWNLOAD SUMMARY: Did not bring     DIABETIC COMPLICATIONS: Microvascular complications:  S/P laser treatment for DR Denies: CKD,  neuropathy  Last Eye Exam: Completed 2022  Macrovascular complications:   Denies: CAD, CVA, PVD   HISTORY:  Past Medical History:  Past Medical History:  Diagnosis Date   Diabetes mellitus ([place])    Hyperlipidemia    Hypertension    Septic arthritis ([place])    R shoulder, history of   Smoker    Past Surgical History:  Past Surgical History:  Procedure Laterality Date   APPENDECTOMY     septic arthritis shoulder     Social History:  reports that he has been smoking cigarettes. He has a 16.50 pack-year smoking history. He has never used smokeless tobacco. He reports current alcohol use. He reports that he does not use drugs. Family History:  Family History  Problem Relation Age of Onset   Diabetes Father    Autoimmune disease Father        liver and renal disease   Cancer Mother        multiple myeloma   Diabetes Maternal Grandfather    Colon cancer Neg Hx    Prostate cancer Neg Hx  HOME MEDICATIONS: Allergies as of 06/24/2021       Reactions   Metformin And Related Other (See Comments)   abd pain with med use   Sulfa Antibiotics Other (See Comments)   Lightheaded, intolerant, "felt sick"        Medication List        Accurate as of June 24, 2021  8:55 AM. If you have any questions, ask your nurse or doctor.          atorvastatin 10 MG tablet Commonly known as: LIPITOR Take 1 tablet (10 mg total) by mouth daily.   cephALEXin 500 MG capsule Commonly known as: KEFLEX Take 1 capsule (500 mg total) by mouth 4 (four) times daily.   lipase/protease/amylase 36000 UNITS Cpep capsule Commonly known as: Creon TAKE 1-2 CAPSULES BY MOUTH 3 TIMES DAILY WITH MEALS.MAY ALSO TAKE 1 CAPSULE AS NEEDED W/ SNACKS   lisinopril 5 MG tablet Commonly known as: ZESTRIL Take 1 tablet  (5 mg total) by mouth daily.   multivitamin tablet Take 1 tablet by mouth daily.         OBJECTIVE:   Vital Signs: BP (!) 144/92 (BP Location: Left Arm, Patient Position: Sitting, Cuff Size: Normal)   Pulse 72   Ht '5\' 11"'  (1.803 m)   Wt 208 lb (94.3 kg)   SpO2 96%   BMI 29.01 kg/m   Wt Readings from Last 3 Encounters:  06/24/21 208 lb (94.3 kg)  06/11/21 209 lb (94.8 kg)  12/29/20 215 lb 12 oz (97.9 kg)     Exam: General: Pt appears well and is in NAD  Lungs: Clear with good BS bilat  Heart: RRR   Abdomen: Normoactive bowel sounds, soft, nontender, without masses or organomegaly palpable  Extremities: No pretibial edema.  Neuro: MS is good with appropriate affect, pt is alert and Ox3   DM foot exam: 08/27/2020   The skin of the feet is intact without sores or ulcerations. The pedal pulses are 2+ on right and 2+ on left. The sensation is decreased  to a screening 5.07, 10 gram monofilament on the right     DATA REVIEWED:   Results for Qualls, REASE WENCE" (MRN [id number]) as of 06/25/2021 10:08  Ref. Range 06/24/2021 09:01  Glucose, Plasma Latest Ref Range: 65 - 139 mg/dL 134   C-peptide pending    Lab Results  Component Value Date   HGBA1C 6.9 (H) 06/04/2021   HGBA1C 5.7 (A) 08/27/2020   HGBA1C 5.9 06/09/2020   Lab Results  Component Value Date   MICROALBUR 3.6 (H) 06/04/2021   LDLCALC 42 06/04/2021   CREATININE 0.83 06/04/2021   Lab Results  Component Value Date   MICRALBCREAT 2.0 06/04/2021     Lab Results  Component Value Date   CHOL 91 06/04/2021   HDL 37.70 (L) 06/04/2021   LDLCALC 42 06/04/2021   TRIG 59.0 06/04/2021   CHOLHDL 2 06/04/2021       07/07/2020   Gluc 121 BUN/Cr 14/0.64 GFR 103 A1c 5.8%  C- peptide 2.6 ng/mL GAD-65 89.7 U/mL     12/16/2020 A1c 6.1 %  Gluc 128 mg/dL  BUN/Cr 18/0.76  GFR 96  LDL 89 mg/dL Tg 69  Alb/CR ratio 44   ASSESSMENT / PLAN / RECOMMENDATIONS:   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 Goal A1c < 7.0 %.  - His A1c has increased from 5.7 % to  6.9 %  He understands that eventually he will need insulin but at this time due to low carb diet, he is able to manage.  - He will continue to follow a strict keto diet at this time, he was advised to continue regular glucose check at home -He has ketone strips at home, he was advised to check for ketones if he feels fatigued, abdominal discomfort, or nausea, patient to contact us with positive ketones for further CMP check  -I will check his serum glucose as well as C-peptide  MEDICATIONS: N/A  EDUCATION / INSTRUCTIONS: BG monitoring instructions: Patient is instructed to check his blood sugars 3 times a day, before meals . Call [place] [facility] if: BG persistently < 70  I reviewed the Rule of 15 for the treatment of hypoglycemia in detail with the patient. Literature supplied.     2) Diabetic complications:  Eye: Does not have known diabetic retinopathy.  Neuro/ Feet: Does  have known diabetic peripheral neuropathy .  Renal: Patient does not have known baseline CKD. He   Will be started on an ACEI   3) Microalbuminuria:   - Slight elevation at 44 mg / gram in 2021, he was started on lisinopril, he is tolerating this well. -Recent microalbuminuria shows resolution   Medication  Continue lisinopril 5 mg daily      F/U in 3 months     Signed electronically by: Mack Guise, MD  [facility]  [address] [place], [place] [address] Phone: [phone] FAX: [phone]   CC: Tonia Ghent, MD [place] [address] Phone: [phone]  Fax: [phone]  Return to Endocrinology clinic as below: Future Appointments  Date Time Provider [place]  07/01/2021  2:15 PM Hayden Pedro, MD [facility] None

## 2021-06-25 ENCOUNTER — Encounter: Payer: Self-pay | Admitting: Internal Medicine

## 2021-06-25 LAB — GLUCOSE, RANDOM: Glucose, Plasma: 134 mg/dL (ref 65–139)

## 2021-06-25 LAB — C-PEPTIDE: C-Peptide: 1.9 ng/mL (ref 0.80–3.85)

## 2021-07-01 ENCOUNTER — Ambulatory Visit: Payer: BC Managed Care – PPO | Admitting: Internal Medicine

## 2021-07-01 ENCOUNTER — Other Ambulatory Visit: Payer: Self-pay

## 2021-07-01 ENCOUNTER — Encounter (INDEPENDENT_AMBULATORY_CARE_PROVIDER_SITE_OTHER): Payer: HMO | Admitting: Ophthalmology

## 2021-07-01 DIAGNOSIS — H43813 Vitreous degeneration, bilateral: Secondary | ICD-10-CM

## 2021-07-01 DIAGNOSIS — E113311 Type 2 diabetes mellitus with moderate nonproliferative diabetic retinopathy with macular edema, right eye: Secondary | ICD-10-CM | POA: Diagnosis not present

## 2021-07-01 DIAGNOSIS — D3132 Benign neoplasm of left choroid: Secondary | ICD-10-CM | POA: Diagnosis not present

## 2021-07-01 DIAGNOSIS — E113392 Type 2 diabetes mellitus with moderate nonproliferative diabetic retinopathy without macular edema, left eye: Secondary | ICD-10-CM | POA: Diagnosis not present

## 2021-07-23 ENCOUNTER — Ambulatory Visit: Payer: Self-pay

## 2021-07-23 ENCOUNTER — Ambulatory Visit (INDEPENDENT_AMBULATORY_CARE_PROVIDER_SITE_OTHER): Payer: HMO

## 2021-07-23 ENCOUNTER — Ambulatory Visit
Admission: RE | Admit: 2021-07-23 | Discharge: 2021-07-23 | Disposition: A | Discharge status: Home / Self Care | Payer: HMO | Source: Ambulatory Visit | Attending: Emergency Medicine | Admitting: Emergency Medicine

## 2021-07-23 ENCOUNTER — Other Ambulatory Visit: Payer: Self-pay

## 2021-07-23 VITALS — BP 160/83 | HR 95 | Temp 98.6°F | Resp 18

## 2021-07-23 DIAGNOSIS — I10 Essential (primary) hypertension: Secondary | ICD-10-CM | POA: Diagnosis not present

## 2021-07-23 DIAGNOSIS — R0602 Shortness of breath: Secondary | ICD-10-CM | POA: Diagnosis not present

## 2021-07-23 DIAGNOSIS — Z1152 Encounter for screening for COVID-19: Secondary | ICD-10-CM | POA: Diagnosis not present

## 2021-07-23 DIAGNOSIS — R059 Cough, unspecified: Secondary | ICD-10-CM

## 2021-07-23 DIAGNOSIS — J189 Pneumonia, unspecified organism: Secondary | ICD-10-CM

## 2021-07-23 MED ORDER — AZITHROMYCIN 250 MG PO TABS: 250.0000 mg | ORAL_TABLET | Freq: Every day | ORAL | 0 refills | Status: DC

## 2021-07-23 MED ORDER — ALBUTEROL SULFATE HFA 108 (90 BASE) MCG/ACT IN AERS: 1.0000 | INHALATION_SPRAY | Freq: Four times a day (QID) | RESPIRATORY_TRACT | 0 refills | Status: DC | PRN

## 2021-07-23 MED ORDER — AMOXICILLIN-POT CLAVULANATE 875-125 MG PO TABS: 1.0000 | ORAL_TABLET | Freq: Two times a day (BID) | ORAL | 0 refills | Status: DC

## 2021-07-23 NOTE — ED Provider Notes (Signed)
Stephen Shelton    CSN: [id number] Arrival date & time: 07/23/21  1545      History   Chief Complaint Chief Complaint  Patient presents with   Nasal Congestion   Sinus Pressure   Chest Congestion    HPI Stephen Shelton is a 65 y.o. male.  Patient presents with 10-day history of congestion and cough productive of yellow phlegm.  He also reports mild occasional shortness of breath which is worse over the past couple of days.  He denies fever, chills, or other symptoms.  OTC treatment attempted at home.  His medical history includes diabetes and hypertension.  Current every day smoker.  The history is provided by the patient and medical records.   Past Medical History:  Diagnosis Date   Diabetes mellitus ([place])    Hyperlipidemia    Hypertension    Septic arthritis ([place])    R shoulder, history of   Smoker     Patient Active Problem List   Diagnosis Date Noted   UTI (urinary tract infection) 06/14/2021   Microalbuminuria 12/29/2020   Latent autoimmune diabetes in adults (LADA), managed as type 2 ([place]) 08/27/2020   Aortic atherosclerosis ([place]) 08/14/2020   Pancreatic insufficiency 06/29/2020   Loose stools 06/11/2020   Medicare welcome exam 05/13/2019   Groin pain 09/12/2018   Acute cystitis without hematuria 11/01/2016   Advance care planning 03/20/2015   Colon cancer screening 09/09/2012   AK (actinic keratosis) 06/02/2012   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 Authorizing Provider  albuterol (VENTOLIN HFA) 108 (90 Base) MCG/ACT inhaler Inhale 1-2 puffs into the lungs every 6 (six) hours as needed for wheezing or  shortness of breath. 07/23/21  Yes Sharion Balloon, NP  amoxicillin-clavulanate (AUGMENTIN) 875-125 MG tablet Take 1 tablet by mouth every 12 (twelve) hours. 07/23/21  Yes Sharion Balloon, NP  azithromycin (ZITHROMAX) 250 MG tablet Take 1 tablet (250 mg total) by mouth daily. Take first 2 tablets together, then 1 every day until finished. 07/23/21  Yes Sharion Balloon, NP  atorvastatin (LIPITOR) 10 MG tablet Take 1 tablet (10 mg total) by mouth daily. 06/09/20   Tonia Ghent, MD  lipase/protease/amylase (CREON) 36000 UNITS CPEP capsule TAKE 1-2 CAPSULES BY MOUTH 3 TIMES DAILY WITH MEALS.MAY ALSO TAKE 1 CAPSULE AS NEEDED W/ SNACKS 01/07/21   Tonia Ghent, MD  lisinopril (ZESTRIL) 5 MG tablet Take 1 tablet (5 mg total) by mouth daily. 12/29/20   Shamleffer, Melanie Crazier, MD  Multiple Vitamin (MULTIVITAMIN) tablet Take 1 tablet by mouth daily.    [provider]    Family History Family History  Problem Relation Age of Onset   Diabetes Father    Autoimmune disease Father        liver and renal disease   Cancer Mother        multiple myeloma   Diabetes Maternal Grandfather    Colon cancer Neg Hx    Prostate cancer Neg Hx     Social History Social History   Tobacco Use   Smoking status: Every Day    Packs/day:  0.50    Years: 33.00    Pack years: 16.50    Types: Cigarettes   Smokeless tobacco: Never  Substance Use Topics   Alcohol use: Yes    Alcohol/week: 0.0 standard drinks    Comment: rarely   Drug use: No     Allergies   Metformin and related and Sulfa antibiotics   Review of Systems Review of Systems  Constitutional:  Negative for chills and fever.  HENT:  Positive for congestion. Negative for ear pain and sore throat.   Respiratory:  Positive for cough and shortness of breath.   Cardiovascular:  Negative for chest pain and palpitations.  Gastrointestinal:  Negative for abdominal pain and vomiting.  Skin:  Negative for color change and rash.  All other systems  reviewed and are negative.   Physical Exam Triage Vital Signs ED Triage Vitals  Enc Vitals Group     BP      Pulse      Resp      Temp      Temp src      SpO2      Weight      Height      Head Circumference      Peak Flow      Pain Score      Pain Loc      Pain Edu?      Excl. in [place]?    No data found.  Updated Vital Signs BP (!) 160/83 (BP Location: Left Arm)   Pulse 95   Temp 98.6 F (37 C)   Resp 18   SpO2 96%   Visual Acuity Right Eye Distance:   Left Eye Distance:   Bilateral Distance:    Right Eye Near:   Left Eye Near:    Bilateral Near:     Physical Exam Vitals and nursing note reviewed.  Constitutional:      General: He is not in acute distress.    Appearance: He is well-developed.  HENT:     Head: Normocephalic and atraumatic.     Right Ear: Tympanic membrane normal.     Left Ear: Tympanic membrane normal.     Nose: Nose normal.     Mouth/Throat:     Mouth: Mucous membranes are moist.     Pharynx: Oropharynx is clear.  Eyes:     Conjunctiva/sclera: Conjunctivae normal.  Cardiovascular:     Rate and Rhythm: Normal rate and regular rhythm.     Heart sounds: Normal heart sounds.  Pulmonary:     Effort: Pulmonary effort is normal. No respiratory distress.     Breath sounds: Wheezing and rhonchi present.  Abdominal:     Palpations: Abdomen is soft.     Tenderness: There is no abdominal tenderness.  Musculoskeletal:     Cervical back: Neck supple.  Skin:    General: Skin is warm and dry.  Neurological:     General: No focal deficit present.     Mental Status: He is alert and oriented to person, place, and time.     Gait: Gait normal.  Psychiatric:        Mood and Affect: Mood normal.        Behavior: Behavior normal.     UC Treatments / Results  Labs (all labs ordered are listed, but only abnormal results are displayed) Labs Reviewed  NOVEL CORONAVIRUS, NAA    EKG   Radiology DG Chest 2 View  Result Date:  07/23/2021 CLINICAL DATA:  Productive cough, shortness of breath, URI and chest congestion EXAM: CHEST - 2 VIEW COMPARISON:  None. FINDINGS: The heart size and mediastinal contours are within normal limits. Diffuse bilateral interstitial pulmonary opacity. Disc degenerative disease of the thoracic spine. IMPRESSION: Diffuse bilateral interstitial pulmonary opacity, consistent with edema or atypical/viral infection. No focal airspace opacity. Electronically Signed   By: Eddie Candle M.D.   On: 07/23/2021 16:44    Procedures Procedures (including critical care time)  Medications Ordered in UC Medications - No data to display  Initial Impression / Assessment and Plan / UC Course  I have reviewed the triage vital signs and the nursing notes.  Pertinent labs & imaging results that were available during my care of the patient were reviewed by me and considered in my medical decision making (see chart for details).  Bilateral pneumonia.  Elevated blood pressure reading with known hypertension.   Chest x-ray shows "diffuse bilateral interstitial pulmonary opacity, consistent with edema or atypical/viral infection. No focal airspace opacity."  Treating with Augmentin and Zithromax.  Instructed patient to follow-up with his PCP next week for recheck.  Strict ED precautions discussed.  Education provided on pneumonia.  PCR COVID pending.   Also discussed that his blood pressure is elevated today and needs to be rechecked by his PCP in 1 to 2 weeks.  Education provided on managing hypertension.  Patient agrees to plan of care.   Final Clinical Impressions(s) / UC Diagnoses   Final diagnoses:  Pneumonia of both lungs due to infectious organism, unspecified part of lung  Encounter for screening for COVID-19  Elevated blood pressure reading in office with diagnosis of hypertension     Discharge Instructions      Take the antibiotics as directed.  Follow-up with your primary care provider next week.  Go  to the emergency department if you have shortness of breath or other concerning symptoms.  Your blood pressure is elevated today at 181/83.  Please have this rechecked by your primary care provider in 1-2 weeks.          ED Prescriptions     Medication Sig Dispense Auth. Provider   amoxicillin-clavulanate (AUGMENTIN) 875-125 MG tablet Take 1 tablet by mouth every 12 (twelve) hours. 14 tablet Sharion Balloon, NP   azithromycin (ZITHROMAX) 250 MG tablet Take 1 tablet (250 mg total) by mouth daily. Take first 2 tablets together, then 1 every day until finished. 6 tablet Sharion Balloon, NP   albuterol (VENTOLIN HFA) 108 (90 Base) MCG/ACT inhaler Inhale 1-2 puffs into the lungs every 6 (six) hours as needed for wheezing or shortness of breath. 18 g Sharion Balloon, NP      PDMP not reviewed this encounter.   Sharion Balloon, NP 07/23/21 1700

## 2021-07-23 NOTE — Discharge Instructions (Addendum)
Take the antibiotics as directed.  Follow-up with your primary care provider next week.  Go to the emergency department if you have shortness of breath or other concerning symptoms.  Your blood pressure is elevated today at 181/83.  Please have this rechecked by your primary care provider in 1-2 weeks.

## 2021-07-23 NOTE — ED Triage Notes (Signed)
Pt here with URI and chest congestion x 10 days. States chest congestion is getting worse over the last few days.

## 2021-07-24 LAB — SARS-COV-2, NAA 2 DAY TAT

## 2021-07-24 LAB — NOVEL CORONAVIRUS, NAA: SARS-CoV-2, NAA: NOT DETECTED

## 2021-07-28 ENCOUNTER — Telehealth: Payer: Self-pay | Admitting: Family Medicine

## 2021-07-28 NOTE — Telephone Encounter (Signed)
Pt called wanted to know if he still needed to have a chest x-ray

## 2021-07-29 NOTE — Telephone Encounter (Signed)
No appointments available until next week. Dr Damita Dunnings is this ok to wait until next week? Patient was doing better with his symptoms overall at this time

## 2021-07-29 NOTE — Telephone Encounter (Signed)
If he is feeling better, if he can check his BP at home (and if better), then reasonable for follow up early next week.  If not feeling better, then needs eval sooner.  And thanks for checking on the prev call.

## 2021-07-29 NOTE — Telephone Encounter (Addendum)
Multiple issues to address  He needs follow-up (either here or at urgent care) regarding his blood pressure and also to listen to his lungs/recheck him.  He may not need a follow-up x-ray if he is clearly improving but it would be worth talking about at an office visit.  Please see what happened with the phone call yesterday and see if a call log can be reviewed.  And please give my apologies to the patient.  Please tell him we are working on this to figure out what happened.  Please let me know about the call log.  Thanks.

## 2021-07-29 NOTE — Telephone Encounter (Signed)
Called patient to get more information in regards to this message. Patient states that he called today and whoever answered the call told him that he would not be able to see PCP until 2 weeks out and when patient asked should I seek care somewhere else that person said yes. So patient states I am looking for a new provider I guess. I apologized to the patient several times that this was handled this way, advised I do not see any notes from today just yesterday. Patient states it was whoever answered the call and that person was rude and that we need to get a handle on this.  Patient was calling to follow up with Dr Damita Dunnings after his visit with urgent care on 07/23/21 for pneumonia. He was advised by that provider to follow up with PCP and discuss if repeat chest xray is needed. Patient states he will just follow up with urgent care provider if needed. Patient states he is feeling better. I advised patient that I would notify Dr Damita Dunnings and the management of this incident and apologized again, patient expressed thanks.

## 2021-07-29 NOTE — Telephone Encounter (Signed)
I will investigate this to find out what happened and get patient scheduled for a follow up.

## 2021-08-03 ENCOUNTER — Other Ambulatory Visit: Payer: Self-pay

## 2021-08-03 ENCOUNTER — Ambulatory Visit
Admission: RE | Admit: 2021-08-03 | Discharge: 2021-08-03 | Disposition: A | Discharge status: Home / Self Care | Payer: HMO | Source: Ambulatory Visit | Attending: Family Medicine | Admitting: Family Medicine

## 2021-08-03 VITALS — BP 171/92 | HR 82 | Temp 98.3°F | Resp 20

## 2021-08-03 DIAGNOSIS — R059 Cough, unspecified: Secondary | ICD-10-CM | POA: Diagnosis not present

## 2021-08-03 NOTE — ED Provider Notes (Signed)
CHIEF COMPLAINT:   Chief Complaint  Patient presents with   Follow-up     SUBJECTIVE/HPI:  HPI A very pleasant 65 y.o.Male presents today with concerns for continued cough after being treated for pneumonia last week.  Patient reports that he is still having a productive cough, but states that overall he feels better. Patient does not report any shortness of breath, chest pain, palpitations, visual changes, weakness, tingling, headache, nausea, vomiting, diarrhea, fever, chills.   has a past medical history of Diabetes mellitus ([place]), Hyperlipidemia, Hypertension, Septic arthritis ([place]), and Smoker.  ROS:  Review of Systems See Subjective/HPI Medications, Allergies and Problem List personally reviewed in Epic today OBJECTIVE:   Vitals:   08/03/21 1208  BP: (!) 171/92  Pulse: 82  Resp: 20  Temp: 98.3 F (36.8 C)  SpO2: 100%    Physical Exam   General: Appears well-developed and well-nourished. No acute distress.  HEENT Head: Normocephalic and atraumatic.   Ears: Hearing grossly intact, no drainage or visible deformity.  Eyes: Conjunctivae and EOM are normal. No eye drainage or scleral icterus bilaterally.  Neck: Normal range of motion, neck is supple.  Cardiovascular: Normal rate. Regular rhythm; no murmurs, gallops, or rubs.  Pulm/Chest: No respiratory distress. Breath sounds normal bilaterally without wheezes, rhonchi, or rales.  Intermittent cough noted. Neurological: Alert and oriented to person, place, and time.  Skin: Skin is warm and dry.  No rashes, lesions, abrasions or bruising noted to skin.   Psychiatric: Normal mood, affect, behavior, and thought content.   Vital signs and nursing note reviewed.   Patient stable and cooperative with examination. PROCEDURES:    LABS/X-RAYS/EKG/MEDS:   No results found for any visits on 08/03/21.  MEDICAL DECISION MAKING:   Patient presents with concerns for continued cough after being treated for pneumonia last week.   Patient reports that he is still having a productive cough, but states that overall he feels better. Patient does not report any shortness of breath, chest pain, palpitations, visual changes, weakness, tingling, headache, nausea, vomiting, diarrhea, fever, chills.  Chart review completed.  No concerns for worsening illness or need to continue additional treatment today.  Lungs are clear and patient is in no acute distress.  Follow-up with PCP within the next 1 week for recheck/follow-up.  Patient verbalized understanding and agreed with treatment plan.  Patient stable upon discharge. ASSESSMENT/PLAN:  1. Cough  Plan:   Discharge Instructions      Follow-up with PCP within 1 week for recheck   Rest, push lots of fluids (especially water), and utilize supportive care for symptoms. You may take take acetaminophen (Tylenol) every 4-6 hours or ibuprofen every 6-8 hours for muscle pain, joint pain, headaches. Mucinex (guaifenesin) may be taken over the counter for cough as needed and can loosen phlegm. Please read the instructions and take as directed. Saline nasal sprays to rinse congestion can help as well. Warm tea with lemon and honey can sooth sore throat and cough, as can cough drops.  Take Mucinex for cough.  Return to clinic for new-onset fever, difficulty breathing, chest pain, symptoms lasting >3 to 4 weeks, or bloody sputum.          Serafina Royals, [place] 08/03/21 1230

## 2021-08-03 NOTE — Discharge Instructions (Addendum)
Follow-up with PCP within 1 week for recheck   Rest, push lots of fluids (especially water), and utilize supportive care for symptoms. You may take take acetaminophen (Tylenol) every 4-6 hours or ibuprofen every 6-8 hours for muscle pain, joint pain, headaches. Mucinex (guaifenesin) may be taken over the counter for cough as needed and can loosen phlegm. Please read the instructions and take as directed. Saline nasal sprays to rinse congestion can help as well. Warm tea with lemon and honey can sooth sore throat and cough, as can cough drops.  Take Mucinex for cough.  Return to clinic for new-onset fever, difficulty breathing, chest pain, symptoms lasting >3 to 4 weeks, or bloody sputum.

## 2021-08-03 NOTE — ED Triage Notes (Signed)
Pt here for pneumonia follow-up after abx tx last week. Still has a productive cough, but states it is getting better.

## 2021-08-03 NOTE — Telephone Encounter (Signed)
Called patent no answer no v/m

## 2021-08-05 NOTE — Telephone Encounter (Signed)
Spoke with patient and he is doing well. He does not want to schedule a visit at this time since he went back to UC and they listened to his lungs and said he was doing good.

## 2021-08-05 NOTE — Telephone Encounter (Signed)
Noted. Thanks.

## 2021-08-05 NOTE — Telephone Encounter (Signed)
Please check on patient and see about scheduling for next week.  Thanks.

## 2021-08-05 NOTE — Telephone Encounter (Signed)
FYI; Looks like patient was seen at [facility] on 08/03/21

## 2021-08-12 ENCOUNTER — Other Ambulatory Visit: Payer: Self-pay

## 2021-08-12 ENCOUNTER — Encounter (INDEPENDENT_AMBULATORY_CARE_PROVIDER_SITE_OTHER): Payer: HMO | Admitting: Ophthalmology

## 2021-08-12 DIAGNOSIS — E113392 Type 2 diabetes mellitus with moderate nonproliferative diabetic retinopathy without macular edema, left eye: Secondary | ICD-10-CM | POA: Diagnosis not present

## 2021-08-12 DIAGNOSIS — E113311 Type 2 diabetes mellitus with moderate nonproliferative diabetic retinopathy with macular edema, right eye: Secondary | ICD-10-CM

## 2021-08-12 DIAGNOSIS — D3132 Benign neoplasm of left choroid: Secondary | ICD-10-CM

## 2021-08-12 DIAGNOSIS — H2513 Age-related nuclear cataract, bilateral: Secondary | ICD-10-CM

## 2021-08-12 DIAGNOSIS — H43813 Vitreous degeneration, bilateral: Secondary | ICD-10-CM | POA: Diagnosis not present

## 2021-08-21 ENCOUNTER — Other Ambulatory Visit: Payer: Self-pay | Admitting: Family Medicine

## 2021-09-25 ENCOUNTER — Ambulatory Visit: Payer: HMO | Admitting: Internal Medicine

## 2021-09-30 ENCOUNTER — Encounter (INDEPENDENT_AMBULATORY_CARE_PROVIDER_SITE_OTHER): Payer: HMO | Admitting: Ophthalmology

## 2021-10-09 ENCOUNTER — Other Ambulatory Visit: Payer: Self-pay

## 2021-10-09 ENCOUNTER — Encounter (INDEPENDENT_AMBULATORY_CARE_PROVIDER_SITE_OTHER): Payer: HMO | Admitting: Ophthalmology

## 2021-10-09 DIAGNOSIS — E113311 Type 2 diabetes mellitus with moderate nonproliferative diabetic retinopathy with macular edema, right eye: Secondary | ICD-10-CM

## 2021-10-09 DIAGNOSIS — E113392 Type 2 diabetes mellitus with moderate nonproliferative diabetic retinopathy without macular edema, left eye: Secondary | ICD-10-CM | POA: Diagnosis not present

## 2021-10-09 DIAGNOSIS — H43813 Vitreous degeneration, bilateral: Secondary | ICD-10-CM

## 2021-10-09 DIAGNOSIS — D3132 Benign neoplasm of left choroid: Secondary | ICD-10-CM | POA: Diagnosis not present

## 2021-10-26 ENCOUNTER — Ambulatory Visit (INDEPENDENT_AMBULATORY_CARE_PROVIDER_SITE_OTHER): Payer: HMO | Admitting: Internal Medicine

## 2021-10-26 ENCOUNTER — Encounter: Payer: Self-pay | Admitting: Internal Medicine

## 2021-10-26 ENCOUNTER — Other Ambulatory Visit: Payer: Self-pay

## 2021-10-26 VITALS — BP 142/80 | HR 79 | Ht 71.0 in | Wt 219.0 lb

## 2021-10-26 DIAGNOSIS — K8689 Other specified diseases of pancreas: Secondary | ICD-10-CM | POA: Diagnosis not present

## 2021-10-26 DIAGNOSIS — R809 Proteinuria, unspecified: Secondary | ICD-10-CM

## 2021-10-26 DIAGNOSIS — E139 Other specified diabetes mellitus without complications: Secondary | ICD-10-CM

## 2021-10-26 DIAGNOSIS — R739 Hyperglycemia, unspecified: Secondary | ICD-10-CM

## 2021-10-26 LAB — POCT GLYCOSYLATED HEMOGLOBIN (HGB A1C): Hemoglobin A1C: 6.9 % — AB (ref 4.0–5.6)

## 2021-10-26 MED ORDER — LISINOPRIL 5 MG PO TABS: 5.0000 mg | ORAL_TABLET | Freq: Every day | ORAL | 3 refills | Status: DC

## 2021-10-26 NOTE — Progress Notes (Signed)
Name: CRAWFORD TAMURA  Age/ Sex: 65 y.o., male   MRN/ DOB: [id number], 1956/07/16     PCP: Tonia Ghent, MD   Reason for Endocrinology Evaluation: Type 2 Diabetes Mellitus  Initial Endocrine Consultative Visit: 08/27/2020    PATIENT IDENTIFIER: Mr. WENZEL BACKLUND is a 65 y.o. male with a past medical history of T2DM and HTN. The patient has followed with Endocrinology clinic since 08/27/2020 for consultative assistance with management of his diabetes.  DIABETIC HISTORY:  Mr. Lenderman was diagnosed with T2DM in 2004, but this was changed to LADA by 06/2020 after he was found to have an elevated GAD-65 at 89.7 U/mL .   He was also checked for C-Peptide in 06/2020 at 2.6 ng/mL with serum glucose 121 mg/dL    He was initially on Metformin but this  caused Abdominal pain. His hemoglobin A1c has ranged from 5.9% in 2021, peaking at 10.9% in 2019.   He was on insulin from 12/2017 until 12/2019, he was taken off insulin by  [facility] health program  ( which is a keto diet) and after losing   30 lbs .    He is on  Creon for pancreatic insufficiency , has been having loose stools since 2019 years which has helped. Celiac test negative.    Maternal Grandfather with DM Father with DM  Daughter with T1DM and thyroid disease  SUBJECTIVE:   During the last visit (06/24/2021): A1c 6.9 % No changes were made.     Today (10/26/2021): Mr. Gaillard is here for a follow up on diabetes care.  He checks his blood sugars 2 times daily. The patient has  had  not hypoglycemic episodes since the last clinic visit   Has chronic loose stools, denies nausea or vomiting   Has ED-   HOME DIABETES REGIMEN:  N/A  Statin: yes ACE-I/ARB: yes    METER DOWNLOAD SUMMARY: unable to download 30 day average 146   66- 435  DIABETIC COMPLICATIONS: Microvascular complications:  S/P laser treatment for DR Denies: CKD,  neuropathy  Last Eye Exam: Completed 2022  Macrovascular complications:   Denies: CAD, CVA,  PVD   HISTORY:  Past Medical History:  Past Medical History:  Diagnosis Date   Diabetes mellitus ([place])    Hyperlipidemia    Hypertension    Septic arthritis ([place])    R shoulder, history of   Smoker    Past Surgical History:  Past Surgical History:  Procedure Laterality Date   APPENDECTOMY     septic arthritis shoulder     Social History:  reports that he has been smoking cigarettes. He has a 16.50 pack-year smoking history. He has never used smokeless tobacco. He reports current alcohol use. He reports that he does not use drugs. Family History:  Family History  Problem Relation Age of Onset   Diabetes Father    Autoimmune disease Father        liver and renal disease   Cancer Mother        multiple myeloma   Diabetes Maternal Grandfather    Colon cancer Neg Hx    Prostate cancer Neg Hx      HOME MEDICATIONS: Allergies as of 10/26/2021       Reactions   Metformin And Related Other (See Comments)   abd pain with med use   Sulfa Antibiotics Other (See Comments)   Lightheaded, intolerant, "felt sick"        Medication List  Accurate as of October 26, 2021  8:40 AM. If you have any questions, ask your nurse or doctor.          STOP taking these medications    amoxicillin-clavulanate 875-125 MG tablet Commonly known as: AUGMENTIN Stopped by: Dorita Sciara, MD   azithromycin 250 MG tablet Commonly known as: ZITHROMAX Stopped by: Dorita Sciara, MD       TAKE these medications    albuterol 108 (90 Base) MCG/ACT inhaler Commonly known as: VENTOLIN HFA Inhale 1-2 puffs into the lungs every 6 (six) hours as needed for wheezing or shortness of breath.   atorvastatin 10 MG tablet Commonly known as: LIPITOR TAKE 1 TABLET BY MOUTH EVERY DAY   lipase/protease/amylase 36000 UNITS Cpep capsule Commonly known as: Creon TAKE 1-2 CAPSULES BY MOUTH 3 TIMES DAILY WITH MEALS.MAY ALSO TAKE 1 CAPSULE AS NEEDED W/ SNACKS   lisinopril 5 MG  tablet Commonly known as: ZESTRIL Take 1 tablet (5 mg total) by mouth daily.   multivitamin tablet Take 1 tablet by mouth daily.         OBJECTIVE:   Vital Signs: BP (!) 142/80   Pulse 79   Ht '5\' 11"'  (1.803 m)   Wt 219 lb (99.3 kg)   SpO2 96%   BMI 30.54 kg/m   Wt Readings from Last 3 Encounters:  10/26/21 219 lb (99.3 kg)  06/24/21 208 lb (94.3 kg)  06/11/21 209 lb (94.8 kg)     Exam: General: Pt appears well and is in NAD  Lungs: Clear with good BS bilat  Heart: RRR   Abdomen: Normoactive bowel sounds, soft, nontender, without masses or organomegaly palpable  Extremities: No pretibial edema.  Neuro: MS is good with appropriate affect, pt is alert and Ox3   DM foot exam: 10/26/2021   The skin of the feet is intact without sores or ulcerations. The pedal pulses are 1+ on right and 1+ on left. The sensation is intact  to a screening 5.07, 10 gram monofilament on the right     DATA REVIEWED:   Results for Koerber, THERMON ZULAUF" (MRN [id number]) as of 06/25/2021 10:08  Ref. Range 06/24/2021 09:01  Glucose, Plasma Latest Ref Range: 65 - 139 mg/dL 134   C-peptide 1.90 ng/mL     Lab Results  Component Value Date   HGBA1C 6.9 (A) 10/26/2021   HGBA1C 6.9 (H) 06/04/2021   HGBA1C 5.7 (A) 08/27/2020   Lab Results  Component Value Date   MICROALBUR 3.6 (H) 06/04/2021   LDLCALC 42 06/04/2021   CREATININE 0.83 06/04/2021   Lab Results  Component Value Date   MICRALBCREAT 2.0 06/04/2021     Lab Results  Component Value Date   CHOL 91 06/04/2021   HDL 37.70 (L) 06/04/2021   LDLCALC 42 06/04/2021   TRIG 59.0 06/04/2021   CHOLHDL 2 06/04/2021       07/07/2020   Gluc 121 BUN/Cr 14/0.64 GFR 103 A1c 5.8%  C- peptide 2.6 ng/mL GAD-65 89.7 U/mL     12/16/2020 A1c 6.1 %  Gluc 128 mg/dL  BUN/Cr 18/0.76  GFR 96  LDL 89 mg/dL Tg 69  Alb/CR ratio 44   ASSESSMENT / PLAN / RECOMMENDATIONS:   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 Goal A1c < 7.0 %.  - His A1c remains stable  - He continues to be on variable  low carb diet  -  He understands that eventually he will need insulin but at this time due to low carb diet, he is able to manage. He will not be a good candidate for GLP-1 agonists due to pancreatic insufficiency nor for SGLT-2 inhibitors.  - He was advised to continue regular glucose check at home as well as ketone checks.   MEDICATIONS: N/A  EDUCATION / INSTRUCTIONS: BG monitoring instructions: Patient is instructed to check his blood sugars 3 times a day, before meals . Call [facility] if: BG persistently < 70  I reviewed the Rule of 15 for the treatment of hypoglycemia in detail with the patient. Literature supplied.     2) Diabetic complications:  Eye: Does not have known diabetic retinopathy.  Neuro/ Feet: Does  have known diabetic peripheral neuropathy .  Renal: Patient does not have known baseline CKD. He   Will be started on an ACEI   3) Microalbuminuria:   - Slight elevation at 44 mg / gram in 2021, he was started on lisinopril, he is tolerating this well. -This has resolved in 2022   Medication  Continue lisinopril 5 mg daily     4) ED:  - Discussed this is multifactorial due to DM , vascular etc  - Pt may benefit from cialis or viagra , pt will discuss with PCP , or urology     F/U in 3 months     Signed electronically by: Mack Guise, MD  [facility]  [address] [place], [place] [address] Phone: [phone] FAX: [phone]   CC: Tonia Ghent, MD [place] [address] Phone: [phone]  Fax: [phone]  Return to Endocrinology clinic as below: Future Appointments  Date Time Provider [place]  10/26/2021  8:50 AM Aceyn Kathol, Melanie Crazier, MD [facility] None   11/27/2021  1:30 PM Hayden Pedro, MD [facility] None

## 2021-11-23 ENCOUNTER — Inpatient Hospital Stay (HOSPITAL_COMMUNITY)
Admission: EM | Admit: 2021-11-23 | Discharge: 2021-11-29 | DRG: 234 | Disposition: A | Discharge status: Home / Self Care | Payer: HMO | Attending: Thoracic Surgery (Cardiothoracic Vascular Surgery) | Admitting: Thoracic Surgery (Cardiothoracic Vascular Surgery)

## 2021-11-23 ENCOUNTER — Encounter (HOSPITAL_COMMUNITY): Payer: Self-pay | Admitting: Emergency Medicine

## 2021-11-23 ENCOUNTER — Other Ambulatory Visit: Payer: Self-pay

## 2021-11-23 ENCOUNTER — Emergency Department (HOSPITAL_COMMUNITY): Payer: HMO

## 2021-11-23 DIAGNOSIS — I281 Aneurysm of pulmonary artery: Secondary | ICD-10-CM | POA: Diagnosis not present

## 2021-11-23 DIAGNOSIS — D62 Acute posthemorrhagic anemia: Secondary | ICD-10-CM | POA: Diagnosis not present

## 2021-11-23 DIAGNOSIS — D6959 Other secondary thrombocytopenia: Secondary | ICD-10-CM | POA: Diagnosis not present

## 2021-11-23 DIAGNOSIS — I340 Nonrheumatic mitral (valve) insufficiency: Secondary | ICD-10-CM | POA: Diagnosis not present

## 2021-11-23 DIAGNOSIS — I161 Hypertensive emergency: Secondary | ICD-10-CM | POA: Diagnosis not present

## 2021-11-23 DIAGNOSIS — Z79899 Other long term (current) drug therapy: Secondary | ICD-10-CM

## 2021-11-23 DIAGNOSIS — K5730 Diverticulosis of large intestine without perforation or abscess without bleeding: Secondary | ICD-10-CM | POA: Diagnosis not present

## 2021-11-23 DIAGNOSIS — K449 Diaphragmatic hernia without obstruction or gangrene: Secondary | ICD-10-CM | POA: Diagnosis not present

## 2021-11-23 DIAGNOSIS — Z20822 Contact with and (suspected) exposure to covid-19: Secondary | ICD-10-CM | POA: Diagnosis present

## 2021-11-23 DIAGNOSIS — J939 Pneumothorax, unspecified: Secondary | ICD-10-CM | POA: Diagnosis not present

## 2021-11-23 DIAGNOSIS — M47816 Spondylosis without myelopathy or radiculopathy, lumbar region: Secondary | ICD-10-CM | POA: Diagnosis not present

## 2021-11-23 DIAGNOSIS — Z01810 Encounter for preprocedural cardiovascular examination: Secondary | ICD-10-CM | POA: Diagnosis not present

## 2021-11-23 DIAGNOSIS — E871 Hypo-osmolality and hyponatremia: Secondary | ICD-10-CM | POA: Diagnosis not present

## 2021-11-23 DIAGNOSIS — E139 Other specified diabetes mellitus without complications: Secondary | ICD-10-CM

## 2021-11-23 DIAGNOSIS — Z833 Family history of diabetes mellitus: Secondary | ICD-10-CM

## 2021-11-23 DIAGNOSIS — I10 Essential (primary) hypertension: Secondary | ICD-10-CM | POA: Diagnosis present

## 2021-11-23 DIAGNOSIS — I214 Non-ST elevation (NSTEMI) myocardial infarction: Principal | ICD-10-CM | POA: Diagnosis present

## 2021-11-23 DIAGNOSIS — J811 Chronic pulmonary edema: Secondary | ICD-10-CM | POA: Diagnosis not present

## 2021-11-23 DIAGNOSIS — R0600 Dyspnea, unspecified: Secondary | ICD-10-CM | POA: Diagnosis not present

## 2021-11-23 DIAGNOSIS — E782 Mixed hyperlipidemia: Secondary | ICD-10-CM | POA: Diagnosis present

## 2021-11-23 DIAGNOSIS — J90 Pleural effusion, not elsewhere classified: Secondary | ICD-10-CM | POA: Diagnosis not present

## 2021-11-23 DIAGNOSIS — F17200 Nicotine dependence, unspecified, uncomplicated: Secondary | ICD-10-CM | POA: Diagnosis not present

## 2021-11-23 DIAGNOSIS — M549 Dorsalgia, unspecified: Secondary | ICD-10-CM | POA: Diagnosis not present

## 2021-11-23 DIAGNOSIS — E8770 Fluid overload, unspecified: Secondary | ICD-10-CM | POA: Diagnosis not present

## 2021-11-23 DIAGNOSIS — Z807 Family history of other malignant neoplasms of lymphoid, hematopoietic and related tissues: Secondary | ICD-10-CM | POA: Diagnosis not present

## 2021-11-23 DIAGNOSIS — Z4682 Encounter for fitting and adjustment of non-vascular catheter: Secondary | ICD-10-CM | POA: Diagnosis not present

## 2021-11-23 DIAGNOSIS — I701 Atherosclerosis of renal artery: Secondary | ICD-10-CM | POA: Diagnosis not present

## 2021-11-23 DIAGNOSIS — Z951 Presence of aortocoronary bypass graft: Secondary | ICD-10-CM

## 2021-11-23 DIAGNOSIS — I2510 Atherosclerotic heart disease of native coronary artery without angina pectoris: Secondary | ICD-10-CM | POA: Diagnosis not present

## 2021-11-23 DIAGNOSIS — E1340 Other specified diabetes mellitus with diabetic neuropathy, unspecified: Secondary | ICD-10-CM | POA: Diagnosis present

## 2021-11-23 DIAGNOSIS — I249 Acute ischemic heart disease, unspecified: Secondary | ICD-10-CM | POA: Diagnosis present

## 2021-11-23 DIAGNOSIS — I213 ST elevation (STEMI) myocardial infarction of unspecified site: Secondary | ICD-10-CM | POA: Diagnosis not present

## 2021-11-23 DIAGNOSIS — I25110 Atherosclerotic heart disease of native coronary artery with unstable angina pectoris: Secondary | ICD-10-CM | POA: Diagnosis present

## 2021-11-23 DIAGNOSIS — F17210 Nicotine dependence, cigarettes, uncomplicated: Secondary | ICD-10-CM | POA: Diagnosis present

## 2021-11-23 DIAGNOSIS — I517 Cardiomegaly: Secondary | ICD-10-CM | POA: Diagnosis not present

## 2021-11-23 DIAGNOSIS — Z87440 Personal history of urinary (tract) infections: Secondary | ICD-10-CM

## 2021-11-23 DIAGNOSIS — R918 Other nonspecific abnormal finding of lung field: Secondary | ICD-10-CM | POA: Diagnosis not present

## 2021-11-23 DIAGNOSIS — M546 Pain in thoracic spine: Secondary | ICD-10-CM | POA: Diagnosis not present

## 2021-11-23 DIAGNOSIS — E13319 Other specified diabetes mellitus with unspecified diabetic retinopathy without macular edema: Secondary | ICD-10-CM | POA: Diagnosis present

## 2021-11-23 DIAGNOSIS — R0789 Other chest pain: Secondary | ICD-10-CM | POA: Diagnosis not present

## 2021-11-23 DIAGNOSIS — R079 Chest pain, unspecified: Secondary | ICD-10-CM | POA: Diagnosis not present

## 2021-11-23 DIAGNOSIS — R0902 Hypoxemia: Secondary | ICD-10-CM | POA: Diagnosis not present

## 2021-11-23 LAB — CBC WITH DIFFERENTIAL/PLATELET
Abs Immature Granulocytes: 0.04 10*3/uL (ref 0.00–0.07)
Basophils Absolute: 0.1 10*3/uL (ref 0.0–0.1)
Basophils Relative: 1 %
Eosinophils Absolute: 0.4 10*3/uL (ref 0.0–0.5)
Eosinophils Relative: 4 %
HCT: 46.1 % (ref 39.0–52.0)
Hemoglobin: 15.3 g/dL (ref 13.0–17.0)
Immature Granulocytes: 0 %
Lymphocytes Relative: 20 %
Lymphs Abs: 2.2 10*3/uL (ref 0.7–4.0)
MCH: 29.1 pg (ref 26.0–34.0)
MCHC: 33.2 g/dL (ref 30.0–36.0)
MCV: 87.6 fL (ref 80.0–100.0)
Monocytes Absolute: 0.8 10*3/uL (ref 0.1–1.0)
Monocytes Relative: 8 %
Neutro Abs: 7.3 10*3/uL (ref 1.7–7.7)
Neutrophils Relative %: 67 %
Platelets: 195 10*3/uL (ref 150–400)
RBC: 5.26 MIL/uL (ref 4.22–5.81)
RDW: 14 % (ref 11.5–15.5)
WBC: 10.9 10*3/uL — ABNORMAL HIGH (ref 4.0–10.5)
nRBC: 0 % (ref 0.0–0.2)

## 2021-11-23 NOTE — ED Triage Notes (Signed)
Patient arrived with EMS from home reports central chest pain with mild SOB radiating to upper back this evening , no emesis or diaphoresis , he received ASA 324 and 3 NTG sl prior to arrival with relief.

## 2021-11-23 NOTE — ED Provider Notes (Signed)
Emergency Medicine Provider Triage Evaluation Note  Stephen Shelton , a 66 y.o. male  was evaluated in triage.  Pt complains of chest and back pain. He states that at about 8 PM he was at at the office in front of his computer when he had sudden onset of severe pain between his shoulder blades. He states that after about 15 minutes this radiated into his chest.  He drove himself home.  He reports mild shortness of breath and diaphoresis during this episode.  He was nauseated but did not vomit.  He denies any recent fevers.  He was given 324 mg ASA by EMS.  He was given 3 nitroglycerin by EMS, and after the third 1 his pain improved.  At the time of my MSE he is pain-free.  He does not have any known cardiac history per his report.  Review of Systems  Positive: Chest pain, back pain, diaphoresis Negative: Vomiting, fever, leg swelling  Physical Exam  BP (!) 146/77    Pulse 76    Temp 98.1 F (36.7 C) (Oral)    Resp 15    SpO2 97%  Gen:   Awake, no distress   Resp:  Normal effort  MSK:   Moves extremities without difficulty  Other:  RRR, lungs CTAB. No pain with palpation of thoracic back.   Medical Decision Making  Medically screening exam initiated at 11:07 PM.  Appropriate orders placed.  [employer] was informed that the remainder of the evaluation will be completed by another provider, this initial triage assessment does not replace that evaluation, and the importance of remaining in the ED until their evaluation is complete.  Note: Portions of this report may have been transcribed using voice recognition software. Every effort was made to ensure accuracy; however, inadvertent computerized transcription errors may be present    Ollen Gross 11/23/21 2309    Merryl Hacker, MD 11/24/21 2259

## 2021-11-24 ENCOUNTER — Encounter (HOSPITAL_COMMUNITY): Payer: Self-pay | Admitting: Cardiology

## 2021-11-24 ENCOUNTER — Inpatient Hospital Stay (HOSPITAL_COMMUNITY): Payer: HMO

## 2021-11-24 ENCOUNTER — Encounter (HOSPITAL_COMMUNITY)
Admission: EM | Disposition: A | Discharge status: Home / Self Care | Payer: Self-pay | Source: Home / Self Care | Attending: Thoracic Surgery (Cardiothoracic Vascular Surgery)

## 2021-11-24 DIAGNOSIS — E785 Hyperlipidemia, unspecified: Secondary | ICD-10-CM | POA: Insufficient documentation

## 2021-11-24 DIAGNOSIS — Z833 Family history of diabetes mellitus: Secondary | ICD-10-CM | POA: Diagnosis not present

## 2021-11-24 DIAGNOSIS — I249 Acute ischemic heart disease, unspecified: Secondary | ICD-10-CM | POA: Diagnosis present

## 2021-11-24 DIAGNOSIS — Z20822 Contact with and (suspected) exposure to covid-19: Secondary | ICD-10-CM | POA: Diagnosis not present

## 2021-11-24 DIAGNOSIS — Z01810 Encounter for preprocedural cardiovascular examination: Secondary | ICD-10-CM

## 2021-11-24 DIAGNOSIS — I214 Non-ST elevation (NSTEMI) myocardial infarction: Secondary | ICD-10-CM | POA: Diagnosis present

## 2021-11-24 DIAGNOSIS — E13319 Other specified diabetes mellitus with unspecified diabetic retinopathy without macular edema: Secondary | ICD-10-CM | POA: Diagnosis not present

## 2021-11-24 DIAGNOSIS — I10 Essential (primary) hypertension: Secondary | ICD-10-CM | POA: Diagnosis present

## 2021-11-24 DIAGNOSIS — F17210 Nicotine dependence, cigarettes, uncomplicated: Secondary | ICD-10-CM | POA: Diagnosis not present

## 2021-11-24 DIAGNOSIS — E1340 Other specified diabetes mellitus with diabetic neuropathy, unspecified: Secondary | ICD-10-CM | POA: Diagnosis not present

## 2021-11-24 DIAGNOSIS — M546 Pain in thoracic spine: Secondary | ICD-10-CM | POA: Insufficient documentation

## 2021-11-24 DIAGNOSIS — E782 Mixed hyperlipidemia: Secondary | ICD-10-CM | POA: Insufficient documentation

## 2021-11-24 DIAGNOSIS — E8770 Fluid overload, unspecified: Secondary | ICD-10-CM | POA: Diagnosis not present

## 2021-11-24 DIAGNOSIS — E871 Hypo-osmolality and hyponatremia: Secondary | ICD-10-CM | POA: Diagnosis not present

## 2021-11-24 DIAGNOSIS — I161 Hypertensive emergency: Secondary | ICD-10-CM | POA: Diagnosis not present

## 2021-11-24 DIAGNOSIS — Z79899 Other long term (current) drug therapy: Secondary | ICD-10-CM | POA: Diagnosis not present

## 2021-11-24 DIAGNOSIS — Z807 Family history of other malignant neoplasms of lymphoid, hematopoietic and related tissues: Secondary | ICD-10-CM | POA: Diagnosis not present

## 2021-11-24 DIAGNOSIS — I25110 Atherosclerotic heart disease of native coronary artery with unstable angina pectoris: Secondary | ICD-10-CM | POA: Diagnosis not present

## 2021-11-24 DIAGNOSIS — D6959 Other secondary thrombocytopenia: Secondary | ICD-10-CM | POA: Diagnosis not present

## 2021-11-24 DIAGNOSIS — Z87440 Personal history of urinary (tract) infections: Secondary | ICD-10-CM | POA: Diagnosis not present

## 2021-11-24 DIAGNOSIS — D62 Acute posthemorrhagic anemia: Secondary | ICD-10-CM | POA: Diagnosis not present

## 2021-11-24 HISTORY — PX: LEFT HEART CATH AND CORONARY ANGIOGRAPHY: CATH118249

## 2021-11-24 LAB — URINALYSIS, ROUTINE W REFLEX MICROSCOPIC
Bilirubin Urine: NEGATIVE
Glucose, UA: NEGATIVE mg/dL
Hgb urine dipstick: NEGATIVE
Ketones, ur: NEGATIVE mg/dL
Nitrite: POSITIVE — AB
Protein, ur: NEGATIVE mg/dL
Specific Gravity, Urine: 1.01 (ref 1.005–1.030)
pH: 7 (ref 5.0–8.0)

## 2021-11-24 LAB — URINALYSIS, MICROSCOPIC (REFLEX)

## 2021-11-24 LAB — TROPONIN I (HIGH SENSITIVITY)
Troponin I (High Sensitivity): 1720 ng/L (ref <18)
Troponin I (High Sensitivity): 6016 ng/L (ref <18)
Troponin I (High Sensitivity): 12338 ng/L (ref <18)
Troponin I (High Sensitivity): 10229 ng/L (ref <18)

## 2021-11-24 LAB — ECHOCARDIOGRAM COMPLETE
AR max vel: 2.61 cm2
AV Area VTI: 2.66 cm2
AV Area mean vel: 2.49 cm2
AV Mean grad: 3 mmHg
AV Peak grad: 5 mmHg
Ao pk vel: 1.12 m/s
Area-P 1/2: 3.48 cm2
Calc EF: 51.6 %
Height: 71 in
S' Lateral: 3.5 cm
Single Plane A2C EF: 51.9 %
Single Plane A4C EF: 50.4 %
Weight: 3462.4 oz

## 2021-11-24 LAB — BLOOD GAS, ARTERIAL
Acid-Base Excess: 2.2 mmol/L — ABNORMAL HIGH (ref 0.0–2.0)
Bicarbonate: 25.9 mmol/L (ref 20.0–28.0)
Drawn by: 51155
FIO2: 21
O2 Saturation: 92.6 %
Patient temperature: 36.9
pCO2 arterial: 38.2 mmHg (ref 32.0–48.0)
pH, Arterial: 7.446 (ref 7.350–7.450)
pO2, Arterial: 67.8 mmHg — ABNORMAL LOW (ref 83.0–108.0)

## 2021-11-24 LAB — COMPREHENSIVE METABOLIC PANEL
ALT: 21 U/L (ref 0–44)
AST: 29 U/L (ref 15–41)
Albumin: 3.7 g/dL (ref 3.5–5.0)
Alkaline Phosphatase: 53 U/L (ref 38–126)
Anion gap: 6 (ref 5–15)
BUN: 12 mg/dL (ref 8–23)
CO2: 25 mmol/L (ref 22–32)
Calcium: 8.9 mg/dL (ref 8.9–10.3)
Chloride: 106 mmol/L (ref 98–111)
Creatinine, Ser: 0.78 mg/dL (ref 0.61–1.24)
GFR, Estimated: >60 mL/min (ref >60)
Glucose, Bld: 192 mg/dL — ABNORMAL HIGH (ref 70–99)
Potassium: 3.9 mmol/L (ref 3.5–5.1)
Sodium: 137 mmol/L (ref 135–145)
Total Bilirubin: 0.7 mg/dL (ref 0.3–1.2)
Total Protein: 6.2 g/dL — ABNORMAL LOW (ref 6.5–8.1)

## 2021-11-24 LAB — HEPARIN LEVEL (UNFRACTIONATED): Heparin Unfractionated: 0.16 IU/mL — ABNORMAL LOW (ref 0.30–0.70)

## 2021-11-24 LAB — GLUCOSE, CAPILLARY
Glucose-Capillary: 99 mg/dL (ref 70–99)
Glucose-Capillary: 168 mg/dL — ABNORMAL HIGH (ref 70–99)

## 2021-11-24 LAB — PROTIME-INR
INR: 1 (ref 0.8–1.2)
Prothrombin Time: 13 seconds (ref 11.4–15.2)

## 2021-11-24 LAB — LIPASE, BLOOD: Lipase: 26 U/L (ref 11–51)

## 2021-11-24 LAB — RESP PANEL BY RT-PCR (FLU A&B, COVID) ARPGX2
Influenza A by PCR: NEGATIVE
Influenza B by PCR: NEGATIVE
SARS Coronavirus 2 by RT PCR: NEGATIVE

## 2021-11-24 LAB — TYPE AND SCREEN
ABO/RH(D): O POS
Antibody Screen: NEGATIVE

## 2021-11-24 LAB — APTT: aPTT: 29 seconds (ref 24–36)

## 2021-11-24 LAB — HIV ANTIBODY (ROUTINE TESTING W REFLEX): HIV Screen 4th Generation wRfx: NONREACTIVE

## 2021-11-24 LAB — TSH: TSH: 0.871 u[IU]/mL (ref 0.350–4.500)

## 2021-11-24 LAB — HEMOGLOBIN A1C
Hgb A1c MFr Bld: 7.1 % — ABNORMAL HIGH (ref 4.8–5.6)
Mean Plasma Glucose: 157.07 mg/dL

## 2021-11-24 LAB — SURGICAL PCR SCREEN
MRSA, PCR: NEGATIVE
Staphylococcus aureus: NEGATIVE

## 2021-11-24 LAB — CBG MONITORING, ED: Glucose-Capillary: 147 mg/dL — ABNORMAL HIGH (ref 70–99)

## 2021-11-24 LAB — ABO/RH: ABO/RH(D): O POS

## 2021-11-24 LAB — BRAIN NATRIURETIC PEPTIDE: B Natriuretic Peptide: 113.8 pg/mL — ABNORMAL HIGH (ref 0.0–100.0)

## 2021-11-24 SURGERY — LEFT HEART CATH AND CORONARY ANGIOGRAPHY
Anesthesia: LOCAL

## 2021-11-24 MED ORDER — DEXMEDETOMIDINE HCL IN NACL 400 MCG/100ML IV SOLN
0.1000 ug/kg/h | INTRAVENOUS | Status: DC
  Filled 2021-11-24: qty 100

## 2021-11-24 MED ORDER — ASPIRIN EC 81 MG PO TBEC: 81.0000 mg | DELAYED_RELEASE_TABLET | Freq: Every day | ORAL | Status: DC

## 2021-11-24 MED ORDER — INSULIN REGULAR(HUMAN) IN NACL 100-0.9 UT/100ML-% IV SOLN
INTRAVENOUS | Status: DC
  Filled 2021-11-24: qty 100

## 2021-11-24 MED ORDER — ASPIRIN 81 MG PO CHEW
81.0000 mg | CHEWABLE_TABLET | ORAL | Status: AC
  Administered 2021-11-24: 81 mg via ORAL
  Filled 2021-11-24: qty 1

## 2021-11-24 MED ORDER — TRANEXAMIC ACID (OHS) PUMP PRIME SOLUTION
2.0000 mg/kg | INTRAVENOUS | Status: DC
  Filled 2021-11-24: qty 1.96

## 2021-11-24 MED ORDER — ACETAMINOPHEN 325 MG PO TABS: 650.0000 mg | ORAL_TABLET | ORAL | Status: DC | PRN

## 2021-11-24 MED ORDER — MANNITOL 20 % IV SOLN
INTRAVENOUS | Status: DC
  Filled 2021-11-24: qty 13

## 2021-11-24 MED ORDER — SODIUM CHLORIDE 0.9% FLUSH: 3.0000 mL | Freq: Two times a day (BID) | INTRAVENOUS | Status: DC

## 2021-11-24 MED ORDER — NOREPINEPHRINE 4 MG/250ML-% IV SOLN
0.0000 ug/min | INTRAVENOUS | Status: DC
  Filled 2021-11-24: qty 250

## 2021-11-24 MED ORDER — ONDANSETRON HCL 4 MG/2ML IJ SOLN: 4.0000 mg | Freq: Four times a day (QID) | INTRAMUSCULAR | Status: DC | PRN

## 2021-11-24 MED ORDER — CHLORHEXIDINE GLUCONATE CLOTH 2 % EX PADS
6.0000 | MEDICATED_PAD | Freq: Once | CUTANEOUS | Status: AC
  Administered 2021-11-24: 6 via TOPICAL

## 2021-11-24 MED ORDER — VERAPAMIL HCL 2.5 MG/ML IV SOLN
INTRAVENOUS | Status: AC
  Filled 2021-11-24: qty 2

## 2021-11-24 MED ORDER — SODIUM CHLORIDE 0.9 % IV SOLN: 250.0000 mL | INTRAVENOUS | Status: DC | PRN

## 2021-11-24 MED ORDER — CEFAZOLIN SODIUM-DEXTROSE 2-4 GM/100ML-% IV SOLN
2.0000 g | INTRAVENOUS | Status: AC
  Administered 2021-11-25: 2 g via INTRAVENOUS
  Filled 2021-11-24: qty 100

## 2021-11-24 MED ORDER — METOPROLOL TARTRATE 12.5 MG HALF TABLET
12.5000 mg | ORAL_TABLET | Freq: Once | ORAL | Status: AC
  Administered 2021-11-25: 12.5 mg via ORAL
  Filled 2021-11-24: qty 1

## 2021-11-24 MED ORDER — HEPARIN (PORCINE) 25000 UT/250ML-% IV SOLN
1450.0000 [IU]/h | INTRAVENOUS | Status: DC
  Administered 2021-11-25: 1450 [IU]/h via INTRAVENOUS
  Filled 2021-11-24: qty 250

## 2021-11-24 MED ORDER — NITROGLYCERIN IN D5W 200-5 MCG/ML-% IV SOLN
2.0000 ug/min | INTRAVENOUS | Status: DC
  Filled 2021-11-24: qty 250

## 2021-11-24 MED ORDER — PLASMA-LYTE A IV SOLN
INTRAVENOUS | Status: DC
  Filled 2021-11-24: qty 5

## 2021-11-24 MED ORDER — LABETALOL HCL 5 MG/ML IV SOLN: 10.0000 mg | INTRAVENOUS | Status: DC | PRN

## 2021-11-24 MED ORDER — LIDOCAINE HCL (PF) 1 % IJ SOLN
INTRAMUSCULAR | Status: AC
  Filled 2021-11-24: qty 30

## 2021-11-24 MED ORDER — INSULIN ASPART 100 UNIT/ML IJ SOLN: 0.0000 [IU] | Freq: Every day | INTRAMUSCULAR | Status: DC

## 2021-11-24 MED ORDER — HEPARIN 30,000 UNITS/1000 ML (OHS) CELLSAVER SOLUTION
Status: DC
  Filled 2021-11-24: qty 1000

## 2021-11-24 MED ORDER — HEPARIN (PORCINE) IN NACL 1000-0.9 UT/500ML-% IV SOLN
INTRAVENOUS | Status: AC
  Filled 2021-11-24: qty 1000

## 2021-11-24 MED ORDER — NITROGLYCERIN 0.4 MG SL SUBL: 0.4000 mg | SUBLINGUAL_TABLET | SUBLINGUAL | Status: DC | PRN

## 2021-11-24 MED ORDER — MILRINONE LACTATE IN DEXTROSE 20-5 MG/100ML-% IV SOLN
0.3000 ug/kg/min | INTRAVENOUS | Status: DC
  Filled 2021-11-24: qty 100

## 2021-11-24 MED ORDER — POTASSIUM CHLORIDE 2 MEQ/ML IV SOLN
80.0000 meq | INTRAVENOUS | Status: DC
  Filled 2021-11-24: qty 40

## 2021-11-24 MED ORDER — VANCOMYCIN HCL 1500 MG/300ML IV SOLN
1500.0000 mg | INTRAVENOUS | Status: AC
  Administered 2021-11-25: 1500 mg via INTRAVENOUS
  Filled 2021-11-24: qty 300

## 2021-11-24 MED ORDER — HEPARIN (PORCINE) IN NACL 1000-0.9 UT/500ML-% IV SOLN
INTRAVENOUS | Status: DC | PRN
  Administered 2021-11-24 (×2): 500 mL

## 2021-11-24 MED ORDER — FENTANYL CITRATE (PF) 100 MCG/2ML IJ SOLN
INTRAMUSCULAR | Status: AC
  Filled 2021-11-24: qty 2

## 2021-11-24 MED ORDER — MIDAZOLAM HCL 2 MG/2ML IJ SOLN
INTRAMUSCULAR | Status: DC | PRN
  Administered 2021-11-24: 2 mg via INTRAVENOUS

## 2021-11-24 MED ORDER — SODIUM CHLORIDE 0.9 % IV SOLN: INTRAVENOUS | Status: AC

## 2021-11-24 MED ORDER — VERAPAMIL HCL 2.5 MG/ML IV SOLN
INTRAVENOUS | Status: DC | PRN
  Administered 2021-11-24: 10 mL via INTRA_ARTERIAL

## 2021-11-24 MED ORDER — LIDOCAINE HCL (PF) 1 % IJ SOLN
INTRAMUSCULAR | Status: DC | PRN
  Administered 2021-11-24: 2 mL

## 2021-11-24 MED ORDER — MIDAZOLAM HCL 2 MG/2ML IJ SOLN
INTRAMUSCULAR | Status: AC
  Filled 2021-11-24: qty 2

## 2021-11-24 MED ORDER — SODIUM CHLORIDE 0.9 % IV SOLN: INTRAVENOUS | Status: DC

## 2021-11-24 MED ORDER — BISACODYL 5 MG PO TBEC
5.0000 mg | DELAYED_RELEASE_TABLET | Freq: Once | ORAL | Status: AC
  Administered 2021-11-24: 5 mg via ORAL
  Filled 2021-11-24: qty 1

## 2021-11-24 MED ORDER — MAGNESIUM SULFATE 50 % IJ SOLN
40.0000 meq | INTRAMUSCULAR | Status: DC
  Filled 2021-11-24: qty 9.85

## 2021-11-24 MED ORDER — HEPARIN SODIUM (PORCINE) 1000 UNIT/ML IJ SOLN
INTRAMUSCULAR | Status: DC | PRN
  Administered 2021-11-24: 5000 [IU] via INTRAVENOUS

## 2021-11-24 MED ORDER — IOHEXOL 350 MG/ML SOLN
INTRAVENOUS | Status: DC | PRN
  Administered 2021-11-24: 55 mL

## 2021-11-24 MED ORDER — TRANEXAMIC ACID (OHS) BOLUS VIA INFUSION
15.0000 mg/kg | INTRAVENOUS | Status: AC
  Administered 2021-11-25: 1473 mg via INTRAVENOUS
  Filled 2021-11-24: qty 1473

## 2021-11-24 MED ORDER — HEPARIN SODIUM (PORCINE) 1000 UNIT/ML IJ SOLN
INTRAMUSCULAR | Status: AC
  Filled 2021-11-24: qty 10

## 2021-11-24 MED ORDER — EPINEPHRINE HCL 5 MG/250ML IV SOLN IN NS
0.0000 ug/min | INTRAVENOUS | Status: DC
  Filled 2021-11-24: qty 250

## 2021-11-24 MED ORDER — TRANEXAMIC ACID 1000 MG/10ML IV SOLN
1.5000 mg/kg/h | INTRAVENOUS | Status: AC
  Administered 2021-11-25: 1.5 mg/kg/h via INTRAVENOUS
  Filled 2021-11-24: qty 25

## 2021-11-24 MED ORDER — HEPARIN BOLUS VIA INFUSION
2000.0000 [IU] | Freq: Once | INTRAVENOUS | Status: AC
  Administered 2021-11-24: 2000 [IU] via INTRAVENOUS
  Filled 2021-11-24: qty 2000

## 2021-11-24 MED ORDER — IOHEXOL 350 MG/ML SOLN
70.0000 mL | Freq: Once | INTRAVENOUS | Status: AC | PRN
  Administered 2021-11-24: 70 mL via INTRAVENOUS

## 2021-11-24 MED ORDER — HEPARIN (PORCINE) 25000 UT/250ML-% IV SOLN
1450.0000 [IU]/h | INTRAVENOUS | Status: DC
  Administered 2021-11-24: 1200 [IU]/h via INTRAVENOUS
  Administered 2021-11-24: 1450 [IU]/h via INTRAVENOUS
  Filled 2021-11-24 (×2): qty 250

## 2021-11-24 MED ORDER — HEPARIN BOLUS VIA INFUSION
4000.0000 [IU] | Freq: Once | INTRAVENOUS | Status: AC
  Administered 2021-11-24: 4000 [IU] via INTRAVENOUS
  Filled 2021-11-24: qty 4000

## 2021-11-24 MED ORDER — INSULIN ASPART 100 UNIT/ML IJ SOLN
0.0000 [IU] | Freq: Three times a day (TID) | INTRAMUSCULAR | Status: DC
  Administered 2021-11-24: 1 [IU] via SUBCUTANEOUS

## 2021-11-24 MED ORDER — TEMAZEPAM 15 MG PO CAPS: 15.0000 mg | ORAL_CAPSULE | Freq: Once | ORAL | Status: DC | PRN

## 2021-11-24 MED ORDER — HYDRALAZINE HCL 20 MG/ML IJ SOLN: 10.0000 mg | INTRAMUSCULAR | Status: DC | PRN

## 2021-11-24 MED ORDER — CHLORHEXIDINE GLUCONATE 0.12 % MT SOLN
15.0000 mL | Freq: Once | OROMUCOSAL | Status: AC
  Administered 2021-11-25: 15 mL via OROMUCOSAL
  Filled 2021-11-24: qty 15

## 2021-11-24 MED ORDER — CHLORHEXIDINE GLUCONATE CLOTH 2 % EX PADS: 6.0000 | MEDICATED_PAD | Freq: Once | CUTANEOUS | Status: DC

## 2021-11-24 MED ORDER — PHENYLEPHRINE HCL-NACL 20-0.9 MG/250ML-% IV SOLN
30.0000 ug/min | INTRAVENOUS | Status: DC
  Filled 2021-11-24: qty 250

## 2021-11-24 MED ORDER — FENTANYL CITRATE (PF) 100 MCG/2ML IJ SOLN
INTRAMUSCULAR | Status: DC | PRN
  Administered 2021-11-24: 50 ug via INTRAVENOUS

## 2021-11-24 MED ORDER — SODIUM CHLORIDE 0.9% FLUSH
3.0000 mL | INTRAVENOUS | Status: DC | PRN
Start: 2021-11-24 — End: 2021-11-25

## 2021-11-24 MED ORDER — SODIUM CHLORIDE 0.9% FLUSH: 3.0000 mL | INTRAVENOUS | Status: DC | PRN

## 2021-11-24 MED ORDER — ATORVASTATIN CALCIUM 80 MG PO TABS
80.0000 mg | ORAL_TABLET | Freq: Every day | ORAL | Status: DC
  Administered 2021-11-24: 80 mg via ORAL
  Filled 2021-11-24: qty 2

## 2021-11-24 SURGICAL SUPPLY — 11 items
CATH 5FR JL3.5 JR4 ANG PIG MP (CATHETERS) ×1 IMPLANT
CATH VISTA GUIDE 6FR XBLAD3.5 (CATHETERS) ×1 IMPLANT
DEVICE RAD COMP TR BAND LRG (VASCULAR PRODUCTS) ×1 IMPLANT
GLIDESHEATH SLEND A-KIT 6F 22G (SHEATH) ×1 IMPLANT
GUIDEWIRE INQWIRE 1.5J.035X260 (WIRE) IMPLANT
INQWIRE 1.5J .035X260CM (WIRE) ×2
KIT HEART LEFT (KITS) ×3 IMPLANT
PACK CARDIAC CATHETERIZATION (CUSTOM PROCEDURE TRAY) ×3 IMPLANT
SYR MEDRAD MARK 7 150ML (SYRINGE) ×3 IMPLANT
TRANSDUCER W/STOPCOCK (MISCELLANEOUS) ×3 IMPLANT
TUBING CIL FLEX 10 FLL-RA (TUBING) ×3 IMPLANT

## 2021-11-24 NOTE — Progress Notes (Signed)
°  Transition of Care [facility]) Screening Note   Patient Details  Name: Stephen Shelton Date of Birth: 1956/10/23   Transition of Care [facility]) CM/SW Contact:    Milas Gain, [place] Phone Number: 11/24/2021, 5:25 PM    Transition of Care Department [facility]) has reviewed patient and no TOC needs have been identified at this time. We will continue to monitor patient advancement through interdisciplinary progression rounds. If new patient transition needs arise, please place a TOC consult.

## 2021-11-24 NOTE — Progress Notes (Signed)
Pre-CABG ultrasound study completed.   Please see CV Proc for preliminary results.   Darlin Coco, RDMS, RVT

## 2021-11-24 NOTE — Consult Note (Signed)
Stephen Shelton       [place] [address]             [phone]        Stephen Shelton [facility] Medical Record #[id number] Date of Birth: 02/13/1956  Referring: No ref. provider found Primary Care: Tonia Ghent, MD Primary Cardiologist:None  Chief Complaint:    Chief Complaint  Patient presents with   Chest Pain    Trop=1,700+    History of Present Illness:     66 year old male admitted following an NSTEMI.  He underwent a left heart cath which showed three-vessel coronary artery disease.  The patient states that he originally had sharp back pain while sitting at a desk earlier today.  This lasted for several hours once EMS was called this was relieved with sublingual nitroglycerin.  He admits to a several week history of anginal symptoms.  He has been able to keep up with this activity however.  He played golf 2 times last week without any symptoms.   Past Medical and Surgical History: Previous Chest Surgery: No Previous Chest Radiation: No Diabetes Mellitus: Yes.  HbA1C pending Creatinine: 0.78  Past Medical History:  Diagnosis Date   Diabetes mellitus ([place])    Hyperlipidemia    Hypertension    Septic arthritis ([place])    R shoulder, history of   Smoker     Past Surgical History:  Procedure Laterality Date   APPENDECTOMY     septic arthritis shoulder       Social History   Tobacco Use  Smoking Status Every Day   Packs/day: 0.50   Years: 33.00   Pack years: 16.50   Types: Cigarettes  Smokeless Tobacco Never    Social History   Substance and Sexual Activity  Alcohol Use Yes   Alcohol/week: 0.0 standard drinks   Comment: rarely     Allergies  Allergen Reactions   Metformin And Related Other (See Comments)    abd pain with med use   Sulfa Antibiotics Other (See Comments)    Lightheaded, intolerant, "felt sick"      Current Facility-Administered Medications  Medication Dose Route Frequency Provider Last Rate Last Admin    0.9 %  sodium chloride infusion   Intravenous Continuous Patwardhan, Manish J, MD       0.9 %  sodium chloride infusion  250 mL Intravenous PRN Patwardhan, Manish J, MD       acetaminophen (TYLENOL) tablet 650 mg  650 mg Oral Q4H PRN Patwardhan, Reynold Bowen, MD       [START ON 11/25/2021] aspirin EC tablet 81 mg  81 mg Oral Daily Tolia, Sunit, DO       atorvastatin (LIPITOR) tablet 80 mg  80 mg Oral Daily Tolia, Sunit, DO   80 mg at 11/24/21 1019   hydrALAZINE (APRESOLINE) injection 10 mg  10 mg Intravenous Q20 Min PRN Patwardhan, Manish J, MD       insulin aspart (novoLOG) injection 0-5 Units  0-5 Units Subcutaneous QHS Tolia, Sunit, DO       insulin aspart (novoLOG) injection 0-9 Units  0-9 Units Subcutaneous TID WC Tolia, Sunit, DO   1 Units at 11/24/21 1215   labetalol (NORMODYNE) injection 10 mg  10 mg Intravenous Q10 min PRN Patwardhan, Manish J, MD       nitroGLYCERIN (NITROSTAT) SL tablet 0.4 mg  0.4 mg Sublingual Q5 Min x 3 PRN Tolia, Sunit, DO  ondansetron (ZOFRAN) injection 4 mg  4 mg Intravenous Q6H PRN Patwardhan, Manish J, MD       sodium chloride flush (NS) 0.9 % injection 3 mL  3 mL Intravenous Q12H Patwardhan, Manish J, MD       sodium chloride flush (NS) 0.9 % injection 3 mL  3 mL Intravenous PRN Patwardhan, Manish J, MD        Medications Prior to Admission  Medication Sig Dispense Refill Last Dose   atorvastatin (LIPITOR) 10 MG tablet TAKE 1 TABLET BY MOUTH EVERY DAY (Patient taking differently: Take 10 mg by mouth daily.) 90 tablet 3 11/23/2021   lisinopril (ZESTRIL) 5 MG tablet Take 1 tablet (5 mg total) by mouth daily. 90 tablet 3 11/23/2021   Multiple Vitamin (MULTIVITAMIN) tablet Take 1 tablet by mouth daily.   11/23/2021   albuterol (VENTOLIN HFA) 108 (90 Base) MCG/ACT inhaler Inhale 1-2 puffs into the lungs every 6 (six) hours as needed for wheezing or shortness of breath. (Patient not taking: Reported on 11/24/2021) 18 g 0 Not Taking   lipase/protease/amylase (CREON) 36000  UNITS CPEP capsule TAKE 1-2 CAPSULES BY MOUTH 3 TIMES DAILY WITH MEALS.MAY ALSO TAKE 1 CAPSULE AS NEEDED W/ SNACKS (Patient not taking: Reported on 11/24/2021) 240 capsule 3 Not Taking    Family History  Problem Relation Age of Onset   Diabetes Father    Autoimmune disease Father        liver and renal disease   Cancer Mother        multiple myeloma   Diabetes Maternal Grandfather    Colon cancer Neg Hx    Prostate cancer Neg Hx      Review of Systems:   Review of Systems  Constitutional:  Negative for malaise/fatigue.  Respiratory:  Positive for shortness of breath.   Cardiovascular:  Positive for chest pain. Negative for palpitations and leg swelling.  Musculoskeletal:  Positive for back pain and myalgias.  Neurological: Negative.      Physical Exam: BP (!) 141/80 (BP Location: Left Arm)    Pulse 69    Temp 97.8 F (36.6 C) (Oral)    Resp 18    Ht _0  (1.803 m)    Wt 98.2 kg    SpO2 96%    BMI 30.18 kg/m  Physical Exam Constitutional:      General: He is not in acute distress.    Appearance: He is well-developed and normal weight.  Eyes:     Extraocular Movements: Extraocular movements intact.  Cardiovascular:     Rate and Rhythm: Normal rate and regular rhythm.  Pulmonary:     Effort: Pulmonary effort is normal. No tachypnea.  Abdominal:     Palpations: Abdomen is soft.  Musculoskeletal:        General: Normal range of motion.     Right lower leg: No edema.     Left lower leg: No edema.  Skin:    General: Skin is warm and dry.  Neurological:     Mental Status: He is alert.      Diagnostic Studies & Laboratory data:    Left Heart Catherization: LM: Normal LAD: Complex mid LAD/Diag2/septal 90% trifurcation lesion ([place] 1,1,1) Lcx: Prox OM2 80% stenosis RCA: Prox CTO. Epicardial right-to-right and left-to-right collaterals, mostly from LAD septals   LVEDP normal LVEF by LV gram 50-55%.   Echo: I personally reviewed his left heart catheterization.   He has preserved biventricular function.  He does have some calcific disease  on his aortic valve, but all the gradients and velocities are within normal limits.  There is no significant mitral or tricuspid valve disease.  EKG: Sinus I have independently reviewed the above radiologic studies and discussed with the patient   Recent Lab Findings: Lab Results  Component Value Date   WBC 10.9 (H) 11/23/2021   HGB 15.3 11/23/2021   HCT 46.1 11/23/2021   PLT 195 11/23/2021   GLUCOSE 192 (H) 11/23/2021   CHOL 91 06/04/2021   TRIG 59.0 06/04/2021   HDL 37.70 (L) 06/04/2021   LDLCALC 42 06/04/2021   ALT 21 11/23/2021   AST 29 11/23/2021   NA 137 11/23/2021   K 3.9 11/23/2021   CL 106 11/23/2021   CREATININE 0.78 11/23/2021   BUN 12 11/23/2021   CO2 25 11/23/2021   TSH 0.871 11/24/2021   HGBA1C 6.9 (A) 10/26/2021      Assessment / Plan:   66 year old male who presents with an NSTEMI.  He has severe three-vessel coronary artery disease.  His echocardiogram shows preserved biventricular function, no significant valvular disease based off the gradients and velocities.  The case was discussed with Dr. Roxan Hockey who we will plan on surgical revascularization on 11/25/2021.     I  spent 40 minutes counseling the patient face to face.   Lajuana Matte 11/24/2021 6:49 PM

## 2021-11-24 NOTE — Progress Notes (Signed)
Inpatient Diabetes Program Recommendations  AACE/ADA: New Consensus Statement on Inpatient Glycemic Control   Target Ranges:  Prepandial:   less than 140 mg/dL      Peak postprandial:   less than 180 mg/dL (1-2 hours)      Critically ill patients:  140 - 180 mg/dL    Latest Reference Range & Units 10/26/21 08:28 11/23/21 23:22  Glucose 70 - 99 mg/dL  192 (H)    Latest Reference Range & Units 06/04/21 08:08 10/26/21 08:28  Hemoglobin A1C 4.0 - 5.6 % 6.9 (H) 6.9    Review of Glycemic Control  Diabetes history: DM2 Outpatient Diabetes medications: None; diet controlled Current orders for Inpatient glycemic control: None  Inpatient Diabetes Program Recommendations:    Insulin: While inpatient, please consider ordering CBGs AC&HS with Novolog 0-9 units TID with meals and Novolog 0-5 units QHS.  NOTE: Per chart, patient sees Dr. Kelton Pillar (Endocrinologist) and was last seen 10/26/21. Per office note on 10/26/21, "His A1c remains stable. He continues to be on variable  low carb diet. He understands that eventually he will need insulin but at this time due to low carb diet, he is able to manage. He will not be a good candidate for GLP-1 agonists due to pancreatic insufficiency nor for SGLT-2 inhibitors. He was advised to continue regular glucose check at home as well as ketone checks."  Thanks,  Barnie Alderman, RN, MSN, CDE Diabetes Coordinator Inpatient Diabetes Program [phone] (Team Pager from 8am to 5pm)

## 2021-11-24 NOTE — Progress Notes (Signed)
ANTICOAGULATION CONSULT NOTE - Follow Up Consult  Pharmacy Consult for IV Heparin Indication: chest pain/ACS  Allergies  Allergen Reactions   Metformin And Related Other (See Comments)    abd pain with med use   Sulfa Antibiotics Other (See Comments)    Lightheaded, intolerant, "felt sick"    Patient Measurements: Height: 5\' 11"  (180.3 cm) Weight: 98.2 kg (216 lb 6.4 oz) IBW/kg (Calculated) : 75.3 Heparin Dosing Weight: 94 kg  Vital Signs: Temp: 97.8 F (36.6 C) (01/03 1710) Temp Source: Oral (01/03 1710) BP: 141/80 (01/03 1710) Pulse Rate: 69 (01/03 1710)  Labs: Recent Labs    11/23/21 2321 11/23/21 2322 11/24/21 0106 11/24/21 0740 11/24/21 0906 11/24/21 1028  HGB 15.3  --   --   --   --   --   HCT 46.1  --   --   --   --   --   PLT 195  --   --   --   --   --   HEPARINUNFRC  --   --   --  0.16*  --   --   CREATININE  --  0.78  --   --   --   --   TROPONINIHS 1,720*  --  6,016*  --  12,338* 10,229*    Estimated Creatinine Clearance: 110 mL/min (by C-G formula based on SCr of 0.78 mg/dL).   Medical History: Past Medical History:  Diagnosis Date   Diabetes mellitus ([place])    Hyperlipidemia    Hypertension    Septic arthritis ([facility])    R shoulder, history of   Smoker     Assessment: 66 yr old man on IV heparin for ACS. Patient was not on anticoagulation PTA. Pt is S/P cardiac cath today, which showed severe multi-vessel CAD; CVTS has been consulted for CABG; CABG planned for 11/25/21. Pharmacy is consulted to resume IV heparin 4 hrs after TR band removal (per RN, TR band removed at 2030 PM). No bleeding post cath, per RN.  H/H 15.3/46.1, plt 195. Pt was on heparin infusion at 1450 units/hr prior to cath.   Goal of Therapy:  Heparin level 0.3-0.7 units/ml Monitor platelets by anticoagulation protocol: Yes   Plan:  Resume heparin at 1450 units/hr at 0030 AM on 11/25/21 (4 hrs after TR band removal) Check heparin level 6 hours after resuming heparin  infusion Monitor daily heparin level, CBC Monitor for bleeding F/U after CABG tomorrow  Gillermina Hu, PharmD, BCPS, [facility]  Clinical Pharmacist 11/24/2021 6:24 PM

## 2021-11-24 NOTE — Progress Notes (Signed)
Dr Kipp Brood notified of UA results. No new orders received. Jessie Foot, RN

## 2021-11-24 NOTE — ED Provider Notes (Signed)
[facility] EMERGENCY DEPARTMENT Provider Note   CSN: [id number] Arrival date & time: 11/23/21  2244     History  Chief Complaint  Patient presents with   Chest Pain    Trop=1,700+    Stephen Shelton is a 66 y.o. male.  66 year old male with history of diabetes, hypertension, obesity and smoker the presents to the emerged from today secondary to acute onset back pain between her shoulder blades that radiated towards his chest around 1930 on January 2.  Patient states that it progressively worsened.  He was associated with nausea, diaphoresis, lightheadedness, dyspnea.  Patient states that he called EMS.  They gave him 3 nitroglycerin and seem to improve with that.  He is never any that this before.  No recent trauma.  No recent illnesses.  No other associated symptoms.  No family history of heart disease.  No personal history of heart disease.  Has never seen a cardiologist.   Chest Pain     Home Medications Prior to Admission medications   Medication Sig Start Date End Date Taking? Authorizing Provider  atorvastatin (LIPITOR) 10 MG tablet TAKE 1 TABLET BY MOUTH EVERY DAY Patient taking differently: Take 10 mg by mouth daily. 08/21/21  Yes Tonia Ghent, MD  lisinopril (ZESTRIL) 5 MG tablet Take 1 tablet (5 mg total) by mouth daily. 10/26/21  Yes Shamleffer, Melanie Crazier, MD  Multiple Vitamin (MULTIVITAMIN) tablet Take 1 tablet by mouth daily.   Yes [provider]  albuterol (VENTOLIN HFA) 108 (90 Base) MCG/ACT inhaler Inhale 1-2 puffs into the lungs every 6 (six) hours as needed for wheezing or shortness of breath. Patient not taking: Reported on 11/24/2021 07/23/21   Sharion Balloon, NP  lipase/protease/amylase (CREON) 36000 UNITS CPEP capsule TAKE 1-2 CAPSULES BY MOUTH 3 TIMES DAILY WITH MEALS.MAY ALSO TAKE 1 CAPSULE AS NEEDED W/ SNACKS Patient not taking: Reported on 11/24/2021 01/07/21   Tonia Ghent, MD      Allergies    Metformin and related and  Sulfa antibiotics    Review of Systems   Review of Systems  Cardiovascular:  Positive for chest pain.  All other systems reviewed and are negative.  Physical Exam Updated Vital Signs BP (!) 165/92    Pulse 79    Temp 98.1 F (36.7 C) (Oral)    Resp 12    SpO2 98%  Physical Exam Vitals and nursing note reviewed.  Constitutional:      Appearance: He is well-developed.  HENT:     Head: Normocephalic and atraumatic.  Cardiovascular:     Rate and Rhythm: Normal rate.  Pulmonary:     Effort: Pulmonary effort is normal. No respiratory distress.     Breath sounds: No decreased breath sounds or wheezing.  Chest:     Chest wall: No mass or deformity.  Abdominal:     General: There is no distension.     Palpations: Abdomen is soft.  Musculoskeletal:        General: Normal range of motion.     Cervical back: Normal range of motion.     Right lower leg: No edema.     Left lower leg: No edema.  Skin:    General: Skin is warm and dry.     Coloration: Skin is not cyanotic or pale.  Neurological:     Mental Status: He is alert.    ED Results / Procedures / Treatments   Labs (all labs ordered are listed, but  only abnormal results are displayed) Labs Reviewed  CBC WITH DIFFERENTIAL/PLATELET - Abnormal; Notable for the following components:      Result Value   WBC 10.9 (*)    All other components within normal limits  COMPREHENSIVE METABOLIC PANEL - Abnormal; Notable for the following components:   Glucose, Bld 192 (*)    Total Protein 6.2 (*)    All other components within normal limits  TROPONIN I (HIGH SENSITIVITY) - Abnormal; Notable for the following components:   Troponin I (High Sensitivity) 1,720 (*)    All other components within normal limits  TROPONIN I (HIGH SENSITIVITY) - Abnormal; Notable for the following components:   Troponin I (High Sensitivity) 6,016 (*)    All other components within normal limits  RESP PANEL BY RT-PCR (FLU A&B, COVID) ARPGX2  LIPASE, BLOOD   HEPARIN LEVEL (UNFRACTIONATED)    EKG EKG Interpretation  Date/Time:  Monday November 23 2021 22:57:11 EST Ventricular Rate:  78 PR Interval:  160 QRS Duration: 102 QT Interval:  390 QTC Calculation: 444 R Axis:   62 Text Interpretation: Normal sinus rhythm ST & T wave abnormality, consider inferior ischemia Abnormal ECG No previous ECGs available Confirmed by Merrily Pew [phone]) on 11/24/2021 12:28:40 AM   EKG Interpretation  Date/Time:  Tuesday November 24 2021 00:43:05 EST Ventricular Rate:  70 PR Interval:  164 QRS Duration: 104 QT Interval:  400 QTC Calculation: 432 R Axis:   67 Text Interpretation: Sinus rhythm Anteroseptal infarct, old Minimal ST depression, inferior leads Confirmed by Merrily Pew [phone]) on 11/24/2021 12:55:08 AM         Radiology DG Chest 2 View  Result Date: 11/23/2021 CLINICAL DATA:  Chest pain. EXAM: CHEST - 2 VIEW COMPARISON:  July 23, 2021 FINDINGS: Mild, chronic appearing increased lung markings are seen without evidence of acute infiltrate, pleural effusion or pneumothorax. The heart size and mediastinal contours are within normal limits. Multilevel degenerative changes are seen throughout the thoracic spine. IMPRESSION: No active cardiopulmonary disease. Electronically Signed   By: Virgina Norfolk M.D.   On: 11/23/2021 23:46    Procedures .Critical Care Performed by: Merrily Pew, MD Authorized by: Merrily Pew, MD   Critical care provider statement:    Critical care time (minutes):  32   Critical care time was exclusive of:  Separately billable procedures and treating other patients and teaching time   Critical care was necessary to treat or prevent imminent or life-threatening deterioration of the following conditions:  Cardiac failure   Critical care was time spent personally by me on the following activities:  Development of treatment plan with patient or surrogate, evaluation of patient's response to treatment, examination of  patient, obtaining history from patient or surrogate, review of old charts, re-evaluation of patient's condition, pulse oximetry, ordering and performing treatments and interventions and ordering and review of laboratory studies    Medications Ordered in ED Medications  heparin ADULT infusion 100 units/mL (25000 units/291mL) (1,200 Units/hr Intravenous New Bag/Given 11/24/21 0104)  heparin bolus via infusion 4,000 Units (4,000 Units Intravenous Bolus from Bag 11/24/21 0104)    ED Course/ Medical Decision Making/ A&P Clinical Course as of 11/24/21 0403  Tue Nov 24, 2021  0017 Troponin I (High Sensitivity)(!!): 1,720 Called charge RN, [place], she is aware of elevated trop and concerning presentation.  [EH]  0214 Attempted to page cardiology initially around 0100. Again around 0130 (by myself), again around 0200 and again at 0215. [JM]  5621 Attempting to call [facility] without  answer [JM]  0301 Attempted to page Dr. Einar Gip 2 more times without success. Called [facility] cards fellow to attempt to get a home phone number without luck.  [JM]  0303 Attempted to call pager number listed in [place] and left call back number that way.  [JM]    Clinical Course User Index [EH] Lorin Glass, PA-C [JM] Beverely Suen, Corene Cornea, MD                           Medical Decision Making  NSTEMI without evolving changes and no active chest pain.  Please see documentation above for summary of most of the attempts at contacting the cardiologist.  I did contact him and he agreed with admission, bed request and temporary orders were placed.   Final Clinical Impression(s) / ED Diagnoses Final diagnoses:  NSTEMI (non-ST elevated myocardial infarction) [facility])    Rx / [place] Orders ED Discharge Orders     None         Becca Bayne, Corene Cornea, MD 11/24/21 [phone]

## 2021-11-24 NOTE — Anesthesia Preprocedure Evaluation (Addendum)
Anesthesia Evaluation  Patient identified by MRN, date of birth, ID band Patient awake    Reviewed: Allergy & Precautions, H&P , NPO status , Patient's Chart, lab work & pertinent test results  Airway Mallampati: III  TM Distance: >3 FB Neck ROM: Full    Dental no notable dental hx. (+) Poor Dentition, Dental Advisory Given   Pulmonary Current Smoker,    Pulmonary exam normal breath sounds clear to auscultation       Cardiovascular Exercise Tolerance: Good hypertension, Pt. on medications + angina + CAD and + Past MI   Rhythm:Regular Rate:Normal     Neuro/Psych negative neurological ROS  negative psych ROS   GI/Hepatic negative GI ROS, Neg liver ROS,   Endo/Other  diabetes  Renal/GU negative Renal ROS  negative genitourinary   Musculoskeletal  (+) Arthritis , Osteoarthritis,    Abdominal   Peds  Hematology negative hematology ROS (+)   Anesthesia Other Findings   Reproductive/Obstetrics negative OB ROS                            Anesthesia Physical Anesthesia Plan  ASA: 4  Anesthesia Plan: General   Post-op Pain Management: Tylenol PO (pre-op)   Induction: Intravenous  PONV Risk Score and Plan: 1 and Midazolam and Ondansetron  Airway Management Planned: Oral ETT  Additional Equipment: Arterial line, CVP, PA Cath, TEE and Ultrasound Guidance Line Placement  Intra-op Plan:   Post-operative Plan: Post-operative intubation/ventilation  Informed Consent: I have reviewed the patients History and Physical, chart, labs and discussed the procedure including the risks, benefits and alternatives for the proposed anesthesia with the patient or authorized representative who has indicated his/her understanding and acceptance.     Dental advisory given  Plan Discussed with: CRNA  Anesthesia Plan Comments:        Anesthesia Quick Evaluation

## 2021-11-24 NOTE — Progress Notes (Signed)
°  Echocardiogram 2D Echocardiogram has been performed.  Merrie Roof F 11/24/2021, 5:52 PM

## 2021-11-24 NOTE — H&P (Signed)
HISTORY AND PHYSICAL  Patient ID: Stephen Shelton MRN: [id number] DOB/AGE: February 24, 1956 66 y.o.  Admit date: 11/23/2021 Attending physician: Rex Kras, DO Primary Physician:  Tonia Ghent, MD  Chief complaint: back pain and chest pain.   HPI:  Stephen Shelton is a 66 y.o. 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  Approximately 7 PM on 11/23/2020 patient had severe onset of acute back pain (between shoulder blades) while he was working at his desk.  He shortly thereafter he became clammy, sweaty, nauseated.  The back pain transitioned anteriorly and he started experiencing chest pain for 20 to 30 minutes.  Because both the chest pain and back pain were not resolving he called EMS by 8:30 PM.  He was given 3 nitroglycerin tablets and by the third tablet his chest pain resolved.  Patient remains free of chest pain and back pain as of yesterday night.  Cardiovascular risk factors include diabetes, hypertension, hyperlipidemia, and smoking 1 pack/day for last 30 years.  EMS as documented blood pressures ranging up to 200 mmHg.  Most recent high sensitive troponin 6016.  EKG notes ST depressions in the inferior leads without underlying injury pattern.   ALLERGIES: Allergies  Allergen Reactions   Metformin And Related Other (See Comments)    abd pain with med use   Sulfa Antibiotics Other (See Comments)    Lightheaded, intolerant, "felt sick"    PAST MEDICAL HISTORY: Past Medical History:  Diagnosis Date   Diabetes mellitus ([place])    Hyperlipidemia    Hypertension    Septic arthritis ([place])    R shoulder, history of   Smoker     PAST SURGICAL HISTORY: Past Surgical History:  Procedure Laterality Date   APPENDECTOMY     septic arthritis shoulder       FAMILY HISTORY: The patient family history includes Autoimmune disease in his father; Cancer in his mother; Diabetes in his father and maternal grandfather.   SOCIAL HISTORY:  The patient  reports that he has been smoking cigarettes. He has a 16.50 pack-year smoking history. He has never used smokeless tobacco. He reports current alcohol use. He reports that he does not use drugs.  MEDICATIONS: Current Outpatient Medications  Medication Instructions   albuterol (VENTOLIN HFA) 108 (90 Base) MCG/ACT inhaler 1-2 puffs, Inhalation, Every 6 hours PRN   atorvastatin (LIPITOR) 10 MG tablet TAKE 1 TABLET BY MOUTH EVERY DAY   lipase/protease/amylase (CREON) 36000 UNITS CPEP capsule TAKE 1-2 CAPSULES BY MOUTH 3 TIMES DAILY WITH MEALS.MAY ALSO TAKE 1 CAPSULE AS NEEDED W/ SNACKS   lisinopril (ZESTRIL) 5 mg, Oral, Daily   Multiple Vitamin (MULTIVITAMIN) tablet 1 tablet, Oral, Daily     heparin 1,200 Units/hr (11/24/21 0104)    REVIEW OF SYSTEMS: Review of Systems  Constitutional: Positive for diaphoresis. Negative for chills and fever.  HENT:  Negative for hoarse voice and nosebleeds.   Eyes:  Negative for discharge, double vision and pain.  Cardiovascular:  Positive for chest pain. Negative for claudication, dyspnea on exertion, leg swelling, near-syncope, orthopnea, palpitations, paroxysmal nocturnal dyspnea and syncope.  Respiratory:  Negative for hemoptysis and shortness of breath.   Musculoskeletal:  Positive for back pain. Negative for muscle cramps and myalgias.  Gastrointestinal:  Positive for nausea. Negative for abdominal pain, constipation, diarrhea, hematemesis, hematochezia, melena and vomiting.  Neurological:  Negative for dizziness and light-headedness.   PHYSICAL EXAM: Vitals with BMI 11/24/2021 11/24/2021 11/24/2021  Height - - -  Weight - - -  BMI - - -  Systolic 381 017 510  Diastolic 78 81 75  Pulse 74 68 71    No intake or output data in the 24 hours ending 11/24/21  0819  Net IO Since Admission: No IO data has been entered for this period [11/24/21 0819] CONSTITUTIONAL: Well-developed and well-nourished. No acute distress.  SKIN: Skin is warm and dry. No rash noted. No cyanosis. No pallor. No jaundice HEAD: Normocephalic and atraumatic.  EYES: No scleral icterus MOUTH/THROAT: Moist oral membranes.  NECK: No JVD present. No thyromegaly noted. No carotid bruits  LYMPHATIC: No visible cervical adenopathy.  CHEST Normal respiratory effort. No intercostal retractions  LUNGS: Decreased breath sounds at the bases,  No stridor. No wheezes. No rales.  CARDIOVASCULAR: Regular rate and rhythm, positive S1-S2, no murmurs rubs or gallops appreciated. ABDOMINAL:  soft, nontender, nondistended, positive bowel sounds all 4 quadrants. No apparent ascites.  EXTREMITIES: No peripheral edema  HEMATOLOGIC: No significant bruising NEUROLOGIC: Oriented to person, place, and time. Nonfocal. Normal muscle tone.  PSYCHIATRIC: Normal mood and affect. Normal behavior. Cooperative  RADIOLOGY: DG Chest 2 View  Result Date: 11/23/2021 CLINICAL DATA:  Chest pain. EXAM: CHEST - 2 VIEW COMPARISON:  July 23, 2021 FINDINGS: Mild, chronic appearing increased lung markings are seen without evidence of acute infiltrate, pleural effusion or pneumothorax. The heart size and mediastinal contours are within normal limits. Multilevel degenerative changes are seen throughout the thoracic spine. IMPRESSION: No active cardiopulmonary disease. Electronically Signed   By: Virgina Norfolk M.D.   On: 11/23/2021 23:46    LABORATORY DATA: Lab Results  Component Value Date   WBC 10.9 (H) 11/23/2021   HGB 15.3 11/23/2021   HCT 46.1 11/23/2021   MCV 87.6 11/23/2021   PLT 195 11/23/2021    Recent Labs  Lab 11/23/21 2322  NA 137  K 3.9  CL 106  CO2 25  BUN 12  CREATININE 0.78  CALCIUM 8.9  PROT 6.2*  BILITOT 0.7  ALKPHOS 53  ALT 21  AST 29  GLUCOSE 192*    Lipid Panel  Lab  Results  Component Value Date   CHOL 91 06/04/2021   HDL 37.70 (L) 06/04/2021   LDLCALC 42 06/04/2021   TRIG 59.0 06/04/2021   CHOLHDL 2 06/04/2021    BNP (last 3 results) No results for input(s): BNP in the last 8760 hours.  HEMOGLOBIN A1C Lab Results  Component Value Date   HGBA1C 6.9 (A) 10/26/2021    Cardiac Panel (last 3 results) No results for input(s): CKTOTAL, CKMB, RELINDX in the last 8760 hours.  Invalid input(s): TROPONINHS  No results found for: CKTOTAL, CKMB, CKMBINDEX   TSH No results for input(s): TSH in the last 8760 hours.    CARDIAC DATABASE: EKG: 11/23/2020: NSR, 78 bpm, ST depressions inferior and lateral leads suggestive of inferolateral ischemia, without underlying injury pattern.  11/24/2021: NSR, 70 bpm, old anteroseptal infarct, ST depressions in inferior leads suggestive of inferior ischemia, without underlying injury pattern.  Echocardiogram: None  Stress Testing:  None  Heart Catheterization: None  IMPRESSION & RECOMMENDATIONS: Trafton Roker Lariccia is a 66 y.o. 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  NSTEMI EKG notes ST depressions in the inferior leads  suggestive of ischemia, high sensitive troponins elevated, chest pain on presentation. Already on IV heparin drip per ACS protocol.   Trend troponins. Echocardiogram will be ordered to evaluate for structural heart disease and left ventricular systolic function. Discussed ischemic work-up including coronary CTA versus left heart catheterization with possible intervention.  Given the non-STEMI and multiple cardiovascular risk factors including diabetes with complications of neuropathy/retinopathy/albuminuria, hypertension, hyperlipidemia, and active smoker that shared decision was to proceed with left heart catheterization. The  procedure of left heart catheterization with possible intervention was explained to the patient in detail.  The indication, alternatives, risks and benefits were reviewed.  Complications include but not limited to bleeding, infection, vascular injury, stroke, myocardial infection, arrhythmia, kidney injury, radiation-related injury in the case of prolonged fluoroscopy use, emergency cardiac surgery, and death. The patient understands the risks of serious complication is 1-2 in 7106 with diagnostic cardiac cath and 1-2% or less with angioplasty/stenting.  The patient and his aunt Patty voices understanding and provides verbal feedback and wishes to proceed with coronary angiography with possible PCI. Start IV Fluids pre cath.  NPO now (patient had breakfast at 8am).  Continue aspirin 81 mg p.o. daily. Uptitrate atorvastatin to 80 mg p.o. nightly  Acute Back pain w/ HTN emergency  Acute onset of back pain between the shoulder blades, blood pressure greater than 200 mmHg based on EMS documentation, with elevated troponins.  Would like to rule out dissection or aortic pathology prior to instrumentation with left heart catheterization. Educated importance of blood pressure management per Blood pressure at the time of the evaluation is better controlled. Restart home medications for now..  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 Last hemoglobin A1c 6.9 as of 10/26/2021  HTN: Restart home blood pressure medications.  Monitor BPs.  Mixed HLD.  Fasting lipid profile in the morning.  Increase atorvastatin to 80 mg p.o. nightly.  Cigarette smoking: Currently smokes 1 pack/day for the last 30 years at least. Start nicotine patch. Tobacco cessation counseling: Currently smoking 1 packs/day   Patient was informed of the dangers of tobacco abuse including stroke, cancer, and MI, as well as benefits of tobacco  cessation. Patient is not willing to quit at this time. 5 mins were spent counseling patient cessation techniques. We discussed various methods to help quit smoking, including deciding on a date to quit, joining a support group, pharmacological agents- nicotine gum/patch/lozenges. I will reassess his progress at the next follow-up visit  Consultants: None   Code Status: Full code   Family Communication: Patient's aunt Chong Sicilian was present at bedside.  Patient provided verbal consent with regards to discussing his medical information in presence.   Disposition Plan: Home.    Time spent: 90 mins  Patient's questions and concerns were addressed to his satisfaction. He voices understanding of the instructions provided during this encounter.   This note was created using a voice recognition software as a result there may be grammatical errors inadvertently enclosed that do not reflect the nature of this encounter. Every attempt is made to correct such errors.  Mechele Claude [facility]  Pager: [phone] Office: [phone] 11/24/2021, 8:19 AM

## 2021-11-24 NOTE — ED Notes (Signed)
Dr. Einar Gip paged to Dr. Pearline Cables per her request

## 2021-11-24 NOTE — Progress Notes (Signed)
ANTICOAGULATION CONSULT NOTE  Pharmacy Consult for Heparin Indication: chest pain/ACS  Allergies  Allergen Reactions   Metformin And Related Other (See Comments)    abd pain with med use   Sulfa Antibiotics Other (See Comments)    Lightheaded, intolerant, "felt sick"    Patient Measurements:   Heparin Dosing Weight: 90 kg  Vital Signs: Temp: 98.1 F (36.7 C) (01/02 2252) Temp Source: Oral (01/02 2252) BP: 138/78 (01/03 0715) Pulse Rate: 74 (01/03 0715)  Labs: Recent Labs    11/23/21 2321 11/23/21 2322 11/24/21 0106 11/24/21 0740  HGB 15.3  --   --   --   HCT 46.1  --   --   --   PLT 195  --   --   --   HEPARINUNFRC  --   --   --  0.16*  CREATININE  --  0.78  --   --   TROPONINIHS 1,720*  --  6,016*  --      CrCl cannot be calculated (Unknown ideal weight.).   Medical History: Past Medical History:  Diagnosis Date   Diabetes mellitus ([place])    Hyperlipidemia    Hypertension    Septic arthritis ([place])    R shoulder, history of   Smoker     Medications:  No current facility-administered medications on file prior to encounter.   Current Outpatient Medications on File Prior to Encounter  Medication Sig Dispense Refill   atorvastatin (LIPITOR) 10 MG tablet TAKE 1 TABLET BY MOUTH EVERY DAY (Patient taking differently: Take 10 mg by mouth daily.) 90 tablet 3   lisinopril (ZESTRIL) 5 MG tablet Take 1 tablet (5 mg total) by mouth daily. 90 tablet 3   Multiple Vitamin (MULTIVITAMIN) tablet Take 1 tablet by mouth daily.     albuterol (VENTOLIN HFA) 108 (90 Base) MCG/ACT inhaler Inhale 1-2 puffs into the lungs every 6 (six) hours as needed for wheezing or shortness of breath. (Patient not taking: Reported on 11/24/2021) 18 g 0   lipase/protease/amylase (CREON) 36000 UNITS CPEP capsule TAKE 1-2 CAPSULES BY MOUTH 3 TIMES DAILY WITH MEALS.MAY ALSO TAKE 1 CAPSULE AS NEEDED W/ SNACKS (Patient not taking: Reported on 11/24/2021) 240 capsule 3     Assessment: 66 y.o. male  continuing on heparin for ACS. Patient is not on anticoagulation PTA. Cardiology evaluation pending.  Initial heparin level low at 0.16. CBC wnl. No bleed issues reported.  Goal of Therapy:  Heparin level 0.3-0.7 units/ml Monitor platelets by anticoagulation protocol: Yes   Plan:  Give heparin 2000 unit bolus x 1 Increase heparin to 1450 units/hr Check heparin level in 6 hours Monitor daily CBC, s/sx bleeding F/u Cardiology plans   Arturo Morton, PharmD, BCPS Please check AMION for all [place] contact numbers Clinical Pharmacist 11/24/2021 8:46 AM

## 2021-11-24 NOTE — Plan of Care (Signed)
Problem: Education: Goal: Knowledge of General Education information will improve Description: Including pain rating scale, medication(s)/side effects and non-pharmacologic comfort measures Outcome: Progressing   Problem: Nutrition: Goal: Adequate nutrition will be maintained Outcome: Progressing   Problem: Coping: Goal: Level of anxiety will decrease Outcome: Progressing   Problem: Elimination: Goal: Will not experience complications related to urinary retention Outcome: Completed/Met

## 2021-11-24 NOTE — H&P (View-Only) (Signed)
Stephen Shelton       [place] [address]             [phone]        Stephen Shelton [facility] Record #[id number] Date of Birth: 02/13/1956  Referring: No ref. provider found Primary Care: Tonia Ghent, MD Primary Cardiologist:None  Chief Complaint:    Chief Complaint  Patient presents with   Chest Pain    Trop=1,700+    History of Present Illness:     66 year old male admitted following an NSTEMI.  He underwent a left heart cath which showed three-vessel coronary artery disease.  The patient states that he originally had sharp back pain while sitting at a desk earlier today.  This lasted for several hours once EMS was called this was relieved with sublingual nitroglycerin.  He admits to a several week history of anginal symptoms.  He has been able to keep up with this activity however.  He played golf 2 times last week without any symptoms.   Past Medical and Surgical History: Previous Chest Surgery: No Previous Chest Radiation: No Diabetes Mellitus: Yes.  HbA1C pending Creatinine: 0.78  Past Medical History:  Diagnosis Date   Diabetes mellitus ([place])    Hyperlipidemia    Hypertension    Septic arthritis ([place])    R shoulder, history of   Smoker     Past Surgical History:  Procedure Laterality Date   APPENDECTOMY     septic arthritis shoulder       Social History   Tobacco Use  Smoking Status Every Day   Packs/day: 0.50   Years: 33.00   Pack years: 16.50   Types: Cigarettes  Smokeless Tobacco Never    Social History   Substance and Sexual Activity  Alcohol Use Yes   Alcohol/week: 0.0 standard drinks   Comment: rarely     Allergies  Allergen Reactions   Metformin And Related Other (See Comments)    abd pain with med use   Sulfa Antibiotics Other (See Comments)    Lightheaded, intolerant, "felt sick"      Current Facility-Administered Medications  Medication Dose Route Frequency Provider Last Rate Last Admin    0.9 %  sodium chloride infusion   Intravenous Continuous Patwardhan, Manish J, MD       0.9 %  sodium chloride infusion  250 mL Intravenous PRN Patwardhan, Manish J, MD       acetaminophen (TYLENOL) tablet 650 mg  650 mg Oral Q4H PRN Patwardhan, Reynold Bowen, MD       [START ON 11/25/2021] aspirin EC tablet 81 mg  81 mg Oral Daily Tolia, Sunit, DO       atorvastatin (LIPITOR) tablet 80 mg  80 mg Oral Daily Tolia, Sunit, DO   80 mg at 11/24/21 1019   hydrALAZINE (APRESOLINE) injection 10 mg  10 mg Intravenous Q20 Min PRN Patwardhan, Manish J, MD       insulin aspart (novoLOG) injection 0-5 Units  0-5 Units Subcutaneous QHS Tolia, Sunit, DO       insulin aspart (novoLOG) injection 0-9 Units  0-9 Units Subcutaneous TID WC Tolia, Sunit, DO   1 Units at 11/24/21 1215   labetalol (NORMODYNE) injection 10 mg  10 mg Intravenous Q10 min PRN Patwardhan, Manish J, MD       nitroGLYCERIN (NITROSTAT) SL tablet 0.4 mg  0.4 mg Sublingual Q5 Min x 3 PRN Tolia, Sunit, DO  ondansetron (ZOFRAN) injection 4 mg  4 mg Intravenous Q6H PRN Patwardhan, Manish J, MD       sodium chloride flush (NS) 0.9 % injection 3 mL  3 mL Intravenous Q12H Patwardhan, Manish J, MD       sodium chloride flush (NS) 0.9 % injection 3 mL  3 mL Intravenous PRN Patwardhan, Manish J, MD        Medications Prior to Admission  Medication Sig Dispense Refill Last Dose   atorvastatin (LIPITOR) 10 MG tablet TAKE 1 TABLET BY MOUTH EVERY DAY (Patient taking differently: Take 10 mg by mouth daily.) 90 tablet 3 11/23/2021   lisinopril (ZESTRIL) 5 MG tablet Take 1 tablet (5 mg total) by mouth daily. 90 tablet 3 11/23/2021   Multiple Vitamin (MULTIVITAMIN) tablet Take 1 tablet by mouth daily.   11/23/2021   albuterol (VENTOLIN HFA) 108 (90 Base) MCG/ACT inhaler Inhale 1-2 puffs into the lungs every 6 (six) hours as needed for wheezing or shortness of breath. (Patient not taking: Reported on 11/24/2021) 18 g 0 Not Taking   lipase/protease/amylase (CREON) 36000  UNITS CPEP capsule TAKE 1-2 CAPSULES BY MOUTH 3 TIMES DAILY WITH MEALS.MAY ALSO TAKE 1 CAPSULE AS NEEDED W/ SNACKS (Patient not taking: Reported on 11/24/2021) 240 capsule 3 Not Taking    Family History  Problem Relation Age of Onset   Diabetes Father    Autoimmune disease Father        liver and renal disease   Cancer Mother        multiple myeloma   Diabetes Maternal Grandfather    Colon cancer Neg Hx    Prostate cancer Neg Hx      Review of Systems:   Review of Systems  Constitutional:  Negative for malaise/fatigue.  Respiratory:  Positive for shortness of breath.   Cardiovascular:  Positive for chest pain. Negative for palpitations and leg swelling.  Musculoskeletal:  Positive for back pain and myalgias.  Neurological: Negative.      Physical Exam: BP (!) 141/80 (BP Location: Left Arm)    Pulse 69    Temp 97.8 F (36.6 C) (Oral)    Resp 18    Ht _0  (1.803 m)    Wt 98.2 kg    SpO2 96%    BMI 30.18 kg/m  Physical Exam Constitutional:      General: He is not in acute distress.    Appearance: He is well-developed and normal weight.  Eyes:     Extraocular Movements: Extraocular movements intact.  Cardiovascular:     Rate and Rhythm: Normal rate and regular rhythm.  Pulmonary:     Effort: Pulmonary effort is normal. No tachypnea.  Abdominal:     Palpations: Abdomen is soft.  Musculoskeletal:        General: Normal range of motion.     Right lower leg: No edema.     Left lower leg: No edema.  Skin:    General: Skin is warm and dry.  Neurological:     Mental Status: He is alert.      Diagnostic Studies & Laboratory data:    Left Heart Catherization: LM: Normal LAD: Complex mid LAD/Diag2/septal 90% trifurcation lesion ([place] 1,1,1) Lcx: Prox OM2 80% stenosis RCA: Prox CTO. Epicardial right-to-right and left-to-right collaterals, mostly from LAD septals   LVEDP normal LVEF by LV gram 50-55%.   Echo: I personally reviewed his left heart catheterization.   He has preserved biventricular function.  He does have some calcific disease  on his aortic valve, but all the gradients and velocities are within normal limits.  There is no significant mitral or tricuspid valve disease.  EKG: Sinus I have independently reviewed the above radiologic studies and discussed with the patient   Recent Lab Findings: Lab Results  Component Value Date   WBC 10.9 (H) 11/23/2021   HGB 15.3 11/23/2021   HCT 46.1 11/23/2021   PLT 195 11/23/2021   GLUCOSE 192 (H) 11/23/2021   CHOL 91 06/04/2021   TRIG 59.0 06/04/2021   HDL 37.70 (L) 06/04/2021   LDLCALC 42 06/04/2021   ALT 21 11/23/2021   AST 29 11/23/2021   NA 137 11/23/2021   K 3.9 11/23/2021   CL 106 11/23/2021   CREATININE 0.78 11/23/2021   BUN 12 11/23/2021   CO2 25 11/23/2021   TSH 0.871 11/24/2021   HGBA1C 6.9 (A) 10/26/2021      Assessment / Plan:   66 year old male who presents with an NSTEMI.  He has severe three-vessel coronary artery disease.  His echocardiogram shows preserved biventricular function, no significant valvular disease based off the gradients and velocities.  The case was discussed with Dr. Roxan Hockey who we will plan on surgical revascularization on 11/25/2021.     I  spent 40 minutes counseling the patient face to face.   Lajuana Matte 11/24/2021 6:49 PM

## 2021-11-24 NOTE — ED Notes (Signed)
Pt amb to restroom and back

## 2021-11-24 NOTE — Progress Notes (Signed)
ANTICOAGULATION CONSULT NOTE - Initial Consult  Pharmacy Consult for Heparin Indication: chest pain/ACS  Allergies  Allergen Reactions   Metformin And Related Other (See Comments)    abd pain with med use   Sulfa Antibiotics Other (See Comments)    Lightheaded, intolerant, "felt sick"    Patient Measurements:   Heparin Dosing Weight: 90 kg  Vital Signs: Temp: 98.1 F (36.7 C) (01/02 2252) Temp Source: Oral (01/02 2252) BP: 146/77 (01/02 2252) Pulse Rate: 76 (01/02 2252)  Labs: Recent Labs    11/23/21 2321 11/23/21 2322  HGB 15.3  --   HCT 46.1  --   PLT 195  --   CREATININE  --  0.78  TROPONINIHS 1,720*  --     CrCl cannot be calculated (Unknown ideal weight.).   Medical History: Past Medical History:  Diagnosis Date   Diabetes mellitus ([place])    Hyperlipidemia    Hypertension    Septic arthritis ([place])    R shoulder, history of   Smoker     Medications:  No current facility-administered medications on file prior to encounter.   Current Outpatient Medications on File Prior to Encounter  Medication Sig Dispense Refill   albuterol (VENTOLIN HFA) 108 (90 Base) MCG/ACT inhaler Inhale 1-2 puffs into the lungs every 6 (six) hours as needed for wheezing or shortness of breath. 18 g 0   atorvastatin (LIPITOR) 10 MG tablet TAKE 1 TABLET BY MOUTH EVERY DAY 90 tablet 3   lipase/protease/amylase (CREON) 36000 UNITS CPEP capsule TAKE 1-2 CAPSULES BY MOUTH 3 TIMES DAILY WITH MEALS.MAY ALSO TAKE 1 CAPSULE AS NEEDED W/ SNACKS 240 capsule 3   lisinopril (ZESTRIL) 5 MG tablet Take 1 tablet (5 mg total) by mouth daily. 90 tablet 3   Multiple Vitamin (MULTIVITAMIN) tablet Take 1 tablet by mouth daily.       Assessment: 66 y.o. male with chest pain for heparin  Goal of Therapy:  Heparin level 0.3-0.7 units/ml Monitor platelets by anticoagulation protocol: Yes   Plan:  Heparin 4000 units IV bolus, then start heparin 1200 units/hr Check heparin level in 6 hours.    Caryl Pina 11/24/2021,12:39 AM

## 2021-11-24 NOTE — Interval H&P Note (Signed)
History and Physical Interval Note:  11/24/2021 2:56 PM  Stephen Shelton  has presented today for surgery, with the diagnosis of nstemi.  The various methods of treatment have been discussed with the patient and family. After consideration of risks, benefits and other options for treatment, the patient has consented to  Procedure(s): LEFT HEART CATH AND CORONARY ANGIOGRAPHY (N/A) as a surgical intervention.  The patient's history has been reviewed, patient examined, no change in status, stable for surgery.  I have reviewed the patient's chart and labs.  Questions were answered to the patient's satisfaction.    2016 Appropriate Use Criteria for Coronary Revascularization in Patients With Acute Coronary Syndrome NSTEMI/UA High Risk (TIMI Score 5-7) NSTEMI/Unstable angina, stabilized patient at high risk Link Here: sistemancia.com Indication:  Revascularization by PCI or CABG of 1 or more arteries in a patient with NSTEMI or unstable angina with Stabilization after presentation High risk for clinical events A (7) Indication: 16; Score 7   [place]

## 2021-11-25 ENCOUNTER — Inpatient Hospital Stay (HOSPITAL_COMMUNITY): Payer: HMO

## 2021-11-25 ENCOUNTER — Inpatient Hospital Stay (HOSPITAL_COMMUNITY): Payer: HMO | Admitting: Anesthesiology

## 2021-11-25 ENCOUNTER — Inpatient Hospital Stay (HOSPITAL_COMMUNITY)
Admission: EM | Disposition: A | Discharge status: Home / Self Care | Payer: Self-pay | Source: Home / Self Care | Attending: Thoracic Surgery (Cardiothoracic Vascular Surgery)

## 2021-11-25 ENCOUNTER — Encounter (HOSPITAL_COMMUNITY): Payer: Self-pay | Admitting: Cardiology

## 2021-11-25 DIAGNOSIS — I2510 Atherosclerotic heart disease of native coronary artery without angina pectoris: Secondary | ICD-10-CM | POA: Diagnosis present

## 2021-11-25 DIAGNOSIS — I25110 Atherosclerotic heart disease of native coronary artery with unstable angina pectoris: Secondary | ICD-10-CM

## 2021-11-25 DIAGNOSIS — Z951 Presence of aortocoronary bypass graft: Secondary | ICD-10-CM

## 2021-11-25 HISTORY — PX: ENDOVEIN HARVEST OF GREATER SAPHENOUS VEIN: SHX5059

## 2021-11-25 HISTORY — PX: CORONARY ARTERY BYPASS GRAFT: SHX141

## 2021-11-25 LAB — POCT I-STAT 7, (LYTES, BLD GAS, ICA,H+H)
Acid-Base Excess: 1 mmol/L (ref 0.0–2.0)
Acid-Base Excess: 2 mmol/L (ref 0.0–2.0)
Acid-Base Excess: 3 mmol/L — ABNORMAL HIGH (ref 0.0–2.0)
Acid-Base Excess: 2 mmol/L (ref 0.0–2.0)
Acid-Base Excess: 2 mmol/L (ref 0.0–2.0)
Acid-Base Excess: 0 mmol/L (ref 0.0–2.0)
Acid-base deficit: 3 mmol/L — ABNORMAL HIGH (ref 0.0–2.0)
Acid-base deficit: 2 mmol/L (ref 0.0–2.0)
Acid-base deficit: 3 mmol/L — ABNORMAL HIGH (ref 0.0–2.0)
Bicarbonate: 27.9 mmol/L (ref 20.0–28.0)
Bicarbonate: 26.8 mmol/L (ref 20.0–28.0)
Bicarbonate: 27.9 mmol/L (ref 20.0–28.0)
Bicarbonate: 25.7 mmol/L (ref 20.0–28.0)
Bicarbonate: 27 mmol/L (ref 20.0–28.0)
Bicarbonate: 25.5 mmol/L (ref 20.0–28.0)
Bicarbonate: 24.1 mmol/L (ref 20.0–28.0)
Bicarbonate: 23.2 mmol/L (ref 20.0–28.0)
Bicarbonate: 22.5 mmol/L (ref 20.0–28.0)
Calcium, Ion: 1.3 mmol/L (ref 1.15–1.40)
Calcium, Ion: 1.06 mmol/L — ABNORMAL LOW (ref 1.15–1.40)
Calcium, Ion: 1.16 mmol/L (ref 1.15–1.40)
Calcium, Ion: 1.16 mmol/L (ref 1.15–1.40)
Calcium, Ion: 1.16 mmol/L (ref 1.15–1.40)
Calcium, Ion: 1.2 mmol/L (ref 1.15–1.40)
Calcium, Ion: 1.22 mmol/L (ref 1.15–1.40)
Calcium, Ion: 1.2 mmol/L (ref 1.15–1.40)
Calcium, Ion: 1.2 mmol/L (ref 1.15–1.40)
HCT: 44 % (ref 39.0–52.0)
HCT: 34 % — ABNORMAL LOW (ref 39.0–52.0)
HCT: 35 % — ABNORMAL LOW (ref 39.0–52.0)
HCT: 34 % — ABNORMAL LOW (ref 39.0–52.0)
HCT: 35 % — ABNORMAL LOW (ref 39.0–52.0)
HCT: 36 % — ABNORMAL LOW (ref 39.0–52.0)
HCT: 39 % (ref 39.0–52.0)
HCT: 36 % — ABNORMAL LOW (ref 39.0–52.0)
HCT: 36 % — ABNORMAL LOW (ref 39.0–52.0)
Hemoglobin: 15 g/dL (ref 13.0–17.0)
Hemoglobin: 11.6 g/dL — ABNORMAL LOW (ref 13.0–17.0)
Hemoglobin: 11.9 g/dL — ABNORMAL LOW (ref 13.0–17.0)
Hemoglobin: 11.6 g/dL — ABNORMAL LOW (ref 13.0–17.0)
Hemoglobin: 11.9 g/dL — ABNORMAL LOW (ref 13.0–17.0)
Hemoglobin: 12.2 g/dL — ABNORMAL LOW (ref 13.0–17.0)
Hemoglobin: 13.3 g/dL (ref 13.0–17.0)
Hemoglobin: 12.2 g/dL — ABNORMAL LOW (ref 13.0–17.0)
Hemoglobin: 12.2 g/dL — ABNORMAL LOW (ref 13.0–17.0)
O2 Saturation: 100 %
O2 Saturation: 100 %
O2 Saturation: 100 %
O2 Saturation: 100 %
O2 Saturation: 100 %
O2 Saturation: 100 %
O2 Saturation: 94 %
O2 Saturation: 97 %
O2 Saturation: 98 %
Patient temperature: 38.6
Patient temperature: 36.4
Patient temperature: 36.5
Potassium: 3.7 mmol/L (ref 3.5–5.1)
Potassium: 3.9 mmol/L (ref 3.5–5.1)
Potassium: 4.3 mmol/L (ref 3.5–5.1)
Potassium: 4.7 mmol/L (ref 3.5–5.1)
Potassium: 4.7 mmol/L (ref 3.5–5.1)
Potassium: 4.2 mmol/L (ref 3.5–5.1)
Potassium: 3.9 mmol/L (ref 3.5–5.1)
Potassium: 4.4 mmol/L (ref 3.5–5.1)
Potassium: 4.4 mmol/L (ref 3.5–5.1)
Sodium: 139 mmol/L (ref 135–145)
Sodium: 138 mmol/L (ref 135–145)
Sodium: 139 mmol/L (ref 135–145)
Sodium: 139 mmol/L (ref 135–145)
Sodium: 140 mmol/L (ref 135–145)
Sodium: 140 mmol/L (ref 135–145)
Sodium: 141 mmol/L (ref 135–145)
Sodium: 139 mmol/L (ref 135–145)
Sodium: 139 mmol/L (ref 135–145)
TCO2: 29 mmol/L (ref 22–32)
TCO2: 28 mmol/L (ref 22–32)
TCO2: 29 mmol/L (ref 22–32)
TCO2: 27 mmol/L (ref 22–32)
TCO2: 28 mmol/L (ref 22–32)
TCO2: 27 mmol/L (ref 22–32)
TCO2: 26 mmol/L (ref 22–32)
TCO2: 24 mmol/L (ref 22–32)
TCO2: 24 mmol/L (ref 22–32)
pCO2 arterial: 52.6 mmHg — ABNORMAL HIGH (ref 32.0–48.0)
pCO2 arterial: 43.1 mmHg (ref 32.0–48.0)
pCO2 arterial: 44.2 mmHg (ref 32.0–48.0)
pCO2 arterial: 37.2 mmHg (ref 32.0–48.0)
pCO2 arterial: 42.4 mmHg (ref 32.0–48.0)
pCO2 arterial: 45.6 mmHg (ref 32.0–48.0)
pCO2 arterial: 52.2 mmHg — ABNORMAL HIGH (ref 32.0–48.0)
pCO2 arterial: 41.3 mmHg (ref 32.0–48.0)
pCO2 arterial: 41 mmHg (ref 32.0–48.0)
pH, Arterial: 7.332 — ABNORMAL LOW (ref 7.350–7.450)
pH, Arterial: 7.391 (ref 7.350–7.450)
pH, Arterial: 7.419 (ref 7.350–7.450)
pH, Arterial: 7.413 (ref 7.350–7.450)
pH, Arterial: 7.448 (ref 7.350–7.450)
pH, Arterial: 7.356 (ref 7.350–7.450)
pH, Arterial: 7.28 — ABNORMAL LOW (ref 7.350–7.450)
pH, Arterial: 7.355 (ref 7.350–7.450)
pH, Arterial: 7.344 — ABNORMAL LOW (ref 7.350–7.450)
pO2, Arterial: 307 mmHg — ABNORMAL HIGH (ref 83.0–108.0)
pO2, Arterial: 364 mmHg — ABNORMAL HIGH (ref 83.0–108.0)
pO2, Arterial: 426 mmHg — ABNORMAL HIGH (ref 83.0–108.0)
pO2, Arterial: 347 mmHg — ABNORMAL HIGH (ref 83.0–108.0)
pO2, Arterial: 437 mmHg — ABNORMAL HIGH (ref 83.0–108.0)
pO2, Arterial: 218 mmHg — ABNORMAL HIGH (ref 83.0–108.0)
pO2, Arterial: 89 mmHg (ref 83.0–108.0)
pO2, Arterial: 97 mmHg (ref 83.0–108.0)
pO2, Arterial: 101 mmHg (ref 83.0–108.0)

## 2021-11-25 LAB — HEMOGLOBIN AND HEMATOCRIT, BLOOD
HCT: 34.8 % — ABNORMAL LOW (ref 39.0–52.0)
Hemoglobin: 12 g/dL — ABNORMAL LOW (ref 13.0–17.0)

## 2021-11-25 LAB — POCT I-STAT, CHEM 8
BUN: 12 mg/dL (ref 8–23)
BUN: 11 mg/dL (ref 8–23)
BUN: 11 mg/dL (ref 8–23)
BUN: 11 mg/dL (ref 8–23)
Calcium, Ion: 1.25 mmol/L (ref 1.15–1.40)
Calcium, Ion: 1.18 mmol/L (ref 1.15–1.40)
Calcium, Ion: 1.17 mmol/L (ref 1.15–1.40)
Calcium, Ion: 1.18 mmol/L (ref 1.15–1.40)
Chloride: 103 mmol/L (ref 98–111)
Chloride: 102 mmol/L (ref 98–111)
Chloride: 105 mmol/L (ref 98–111)
Chloride: 104 mmol/L (ref 98–111)
Creatinine, Ser: 0.6 mg/dL — ABNORMAL LOW (ref 0.61–1.24)
Creatinine, Ser: 0.5 mg/dL — ABNORMAL LOW (ref 0.61–1.24)
Creatinine, Ser: 0.5 mg/dL — ABNORMAL LOW (ref 0.61–1.24)
Creatinine, Ser: 0.5 mg/dL — ABNORMAL LOW (ref 0.61–1.24)
Glucose, Bld: 137 mg/dL — ABNORMAL HIGH (ref 70–99)
Glucose, Bld: 125 mg/dL — ABNORMAL HIGH (ref 70–99)
Glucose, Bld: 139 mg/dL — ABNORMAL HIGH (ref 70–99)
Glucose, Bld: 153 mg/dL — ABNORMAL HIGH (ref 70–99)
HCT: 40 % (ref 39.0–52.0)
HCT: 34 % — ABNORMAL LOW (ref 39.0–52.0)
HCT: 33 % — ABNORMAL LOW (ref 39.0–52.0)
HCT: 36 % — ABNORMAL LOW (ref 39.0–52.0)
Hemoglobin: 13.6 g/dL (ref 13.0–17.0)
Hemoglobin: 11.6 g/dL — ABNORMAL LOW (ref 13.0–17.0)
Hemoglobin: 11.2 g/dL — ABNORMAL LOW (ref 13.0–17.0)
Hemoglobin: 12.2 g/dL — ABNORMAL LOW (ref 13.0–17.0)
Potassium: 3.7 mmol/L (ref 3.5–5.1)
Potassium: 4.3 mmol/L (ref 3.5–5.1)
Potassium: 4.6 mmol/L (ref 3.5–5.1)
Potassium: 4.1 mmol/L (ref 3.5–5.1)
Sodium: 139 mmol/L (ref 135–145)
Sodium: 139 mmol/L (ref 135–145)
Sodium: 138 mmol/L (ref 135–145)
Sodium: 140 mmol/L (ref 135–145)
TCO2: 26 mmol/L (ref 22–32)
TCO2: 27 mmol/L (ref 22–32)
TCO2: 26 mmol/L (ref 22–32)
TCO2: 26 mmol/L (ref 22–32)

## 2021-11-25 LAB — BASIC METABOLIC PANEL
Anion gap: 10 (ref 5–15)
Anion gap: 4 — ABNORMAL LOW (ref 5–15)
BUN: 10 mg/dL (ref 8–23)
BUN: 12 mg/dL (ref 8–23)
CO2: 22 mmol/L (ref 22–32)
CO2: 22 mmol/L (ref 22–32)
Calcium: 9 mg/dL (ref 8.9–10.3)
Calcium: 8 mg/dL — ABNORMAL LOW (ref 8.9–10.3)
Chloride: 104 mmol/L (ref 98–111)
Chloride: 110 mmol/L (ref 98–111)
Creatinine, Ser: 0.69 mg/dL (ref 0.61–1.24)
Creatinine, Ser: 0.59 mg/dL — ABNORMAL LOW (ref 0.61–1.24)
GFR, Estimated: >60 mL/min (ref >60)
GFR, Estimated: >60 mL/min (ref >60)
Glucose, Bld: 124 mg/dL — ABNORMAL HIGH (ref 70–99)
Glucose, Bld: 115 mg/dL — ABNORMAL HIGH (ref 70–99)
Potassium: 3.8 mmol/L (ref 3.5–5.1)
Potassium: 4.4 mmol/L (ref 3.5–5.1)
Sodium: 136 mmol/L (ref 135–145)
Sodium: 136 mmol/L (ref 135–145)

## 2021-11-25 LAB — PROTIME-INR
INR: 1.2 (ref 0.8–1.2)
Prothrombin Time: 15.2 seconds (ref 11.4–15.2)

## 2021-11-25 LAB — POCT I-STAT EG7
Acid-Base Excess: 1 mmol/L (ref 0.0–2.0)
Bicarbonate: 26.5 mmol/L (ref 20.0–28.0)
Calcium, Ion: 1.12 mmol/L — ABNORMAL LOW (ref 1.15–1.40)
HCT: 34 % — ABNORMAL LOW (ref 39.0–52.0)
Hemoglobin: 11.6 g/dL — ABNORMAL LOW (ref 13.0–17.0)
O2 Saturation: 83 %
Potassium: 3.7 mmol/L (ref 3.5–5.1)
Sodium: 139 mmol/L (ref 135–145)
TCO2: 28 mmol/L (ref 22–32)
pCO2, Ven: 44.1 mmHg (ref 44.0–60.0)
pH, Ven: 7.386 (ref 7.250–7.430)
pO2, Ven: 48 mmHg — ABNORMAL HIGH (ref 32.0–45.0)

## 2021-11-25 LAB — CBC
HCT: 44.1 % (ref 39.0–52.0)
HCT: 39.5 % (ref 39.0–52.0)
HCT: 37.1 % — ABNORMAL LOW (ref 39.0–52.0)
Hemoglobin: 14.8 g/dL (ref 13.0–17.0)
Hemoglobin: 13 g/dL (ref 13.0–17.0)
Hemoglobin: 12.1 g/dL — ABNORMAL LOW (ref 13.0–17.0)
MCH: 29.1 pg (ref 26.0–34.0)
MCH: 28.8 pg (ref 26.0–34.0)
MCH: 28.7 pg (ref 26.0–34.0)
MCHC: 33.6 g/dL (ref 30.0–36.0)
MCHC: 32.9 g/dL (ref 30.0–36.0)
MCHC: 32.6 g/dL (ref 30.0–36.0)
MCV: 86.8 fL (ref 80.0–100.0)
MCV: 87.6 fL (ref 80.0–100.0)
MCV: 88.1 fL (ref 80.0–100.0)
Platelets: 195 10*3/uL (ref 150–400)
Platelets: 164 10*3/uL (ref 150–400)
Platelets: 137 10*3/uL — ABNORMAL LOW (ref 150–400)
RBC: 5.08 MIL/uL (ref 4.22–5.81)
RBC: 4.51 MIL/uL (ref 4.22–5.81)
RBC: 4.21 MIL/uL — ABNORMAL LOW (ref 4.22–5.81)
RDW: 14.4 % (ref 11.5–15.5)
RDW: 14 % (ref 11.5–15.5)
RDW: 14 % (ref 11.5–15.5)
WBC: 11.5 10*3/uL — ABNORMAL HIGH (ref 4.0–10.5)
WBC: 17.6 10*3/uL — ABNORMAL HIGH (ref 4.0–10.5)
WBC: 13.6 10*3/uL — ABNORMAL HIGH (ref 4.0–10.5)
nRBC: 0 % (ref 0.0–0.2)
nRBC: 0 % (ref 0.0–0.2)
nRBC: 0 % (ref 0.0–0.2)

## 2021-11-25 LAB — GLUCOSE, CAPILLARY
Glucose-Capillary: 144 mg/dL — ABNORMAL HIGH (ref 70–99)
Glucose-Capillary: 132 mg/dL — ABNORMAL HIGH (ref 70–99)
Glucose-Capillary: 119 mg/dL — ABNORMAL HIGH (ref 70–99)
Glucose-Capillary: 90 mg/dL (ref 70–99)
Glucose-Capillary: 112 mg/dL — ABNORMAL HIGH (ref 70–99)
Glucose-Capillary: 105 mg/dL — ABNORMAL HIGH (ref 70–99)
Glucose-Capillary: 124 mg/dL — ABNORMAL HIGH (ref 70–99)
Glucose-Capillary: 91 mg/dL (ref 70–99)
Glucose-Capillary: 128 mg/dL — ABNORMAL HIGH (ref 70–99)
Glucose-Capillary: 132 mg/dL — ABNORMAL HIGH (ref 70–99)

## 2021-11-25 LAB — APTT: aPTT: 28 seconds (ref 24–36)

## 2021-11-25 LAB — PLATELET COUNT: Platelets: 176 10*3/uL (ref 150–400)

## 2021-11-25 LAB — MAGNESIUM: Magnesium: 2.8 mg/dL — ABNORMAL HIGH (ref 1.7–2.4)

## 2021-11-25 LAB — HEPARIN LEVEL (UNFRACTIONATED): Heparin Unfractionated: <0.1 IU/mL — ABNORMAL LOW (ref 0.30–0.70)

## 2021-11-25 SURGERY — CORONARY ARTERY BYPASS GRAFTING (CABG)
Anesthesia: General | Site: Chest | Laterality: Right

## 2021-11-25 MED ORDER — ACETAMINOPHEN 160 MG/5ML PO SOLN: 650.0000 mg | Freq: Once | ORAL | Status: AC

## 2021-11-25 MED ORDER — FENTANYL CITRATE (PF) 250 MCG/5ML IJ SOLN
INTRAMUSCULAR | Status: AC
  Filled 2021-11-25: qty 5

## 2021-11-25 MED ORDER — MAGNESIUM SULFATE 4 GM/100ML IV SOLN
4.0000 g | Freq: Once | INTRAVENOUS | Status: AC
  Administered 2021-11-25: 4 g via INTRAVENOUS
  Filled 2021-11-25: qty 100

## 2021-11-25 MED ORDER — ACETAMINOPHEN 500 MG PO TABS
1000.0000 mg | ORAL_TABLET | Freq: Four times a day (QID) | ORAL | Status: DC
  Administered 2021-11-25 – 2021-11-29 (×13): 1000 mg via ORAL
  Filled 2021-11-25 (×13): qty 2

## 2021-11-25 MED ORDER — DEXTROSE 50 % IV SOLN: 0.0000 mL | INTRAVENOUS | Status: DC | PRN

## 2021-11-25 MED ORDER — DOCUSATE SODIUM 100 MG PO CAPS
200.0000 mg | ORAL_CAPSULE | Freq: Every day | ORAL | Status: DC
  Administered 2021-11-26 – 2021-11-27 (×2): 200 mg via ORAL
  Filled 2021-11-25 (×3): qty 2

## 2021-11-25 MED ORDER — PROPOFOL 10 MG/ML IV BOLUS
INTRAVENOUS | Status: AC
  Filled 2021-11-25: qty 20

## 2021-11-25 MED ORDER — 0.9 % SODIUM CHLORIDE (POUR BTL) OPTIME
TOPICAL | Status: DC | PRN
  Administered 2021-11-25: 5000 mL

## 2021-11-25 MED ORDER — BISACODYL 5 MG PO TBEC
10.0000 mg | DELAYED_RELEASE_TABLET | Freq: Every day | ORAL | Status: DC
  Administered 2021-11-26 – 2021-11-27 (×2): 10 mg via ORAL
  Filled 2021-11-25 (×2): qty 2

## 2021-11-25 MED ORDER — SODIUM CHLORIDE (PF) 0.9 % IJ SOLN
OROMUCOSAL | Status: DC | PRN
  Administered 2021-11-25 (×3): 4 mL via TOPICAL

## 2021-11-25 MED ORDER — ACETAMINOPHEN 160 MG/5ML PO SOLN: 1000.0000 mg | Freq: Four times a day (QID) | ORAL | Status: DC

## 2021-11-25 MED ORDER — TRAMADOL HCL 50 MG PO TABS
50.0000 mg | ORAL_TABLET | ORAL | Status: DC | PRN
  Administered 2021-11-26: 50 mg via ORAL
  Filled 2021-11-25: qty 1

## 2021-11-25 MED ORDER — SODIUM CHLORIDE 0.9 % IV SOLN: INTRAVENOUS | Status: DC | PRN

## 2021-11-25 MED ORDER — SODIUM CHLORIDE 0.45 % IV SOLN: INTRAVENOUS | Status: DC | PRN

## 2021-11-25 MED ORDER — ASPIRIN EC 325 MG PO TBEC: 325.0000 mg | DELAYED_RELEASE_TABLET | Freq: Every day | ORAL | Status: DC

## 2021-11-25 MED ORDER — LACTATED RINGERS IV SOLN: INTRAVENOUS | Status: DC | PRN

## 2021-11-25 MED ORDER — ACETAMINOPHEN 650 MG RE SUPP
650.0000 mg | Freq: Once | RECTAL | Status: AC
  Administered 2021-11-25: 650 mg via RECTAL

## 2021-11-25 MED ORDER — VANCOMYCIN HCL IN DEXTROSE 1-5 GM/200ML-% IV SOLN
1000.0000 mg | Freq: Once | INTRAVENOUS | Status: AC
  Administered 2021-11-25: 1000 mg via INTRAVENOUS
  Filled 2021-11-25: qty 200

## 2021-11-25 MED ORDER — BISACODYL 10 MG RE SUPP: 10.0000 mg | Freq: Every day | RECTAL | Status: DC

## 2021-11-25 MED ORDER — MORPHINE SULFATE (PF) 2 MG/ML IV SOLN
1.0000 mg | INTRAVENOUS | Status: DC | PRN
  Administered 2021-11-25 (×2): 2 mg via INTRAVENOUS
  Filled 2021-11-25 (×2): qty 1

## 2021-11-25 MED ORDER — FENTANYL CITRATE (PF) 100 MCG/2ML IJ SOLN
INTRAMUSCULAR | Status: DC | PRN
  Administered 2021-11-25: 150 ug via INTRAVENOUS
  Administered 2021-11-25: 100 ug via INTRAVENOUS
  Administered 2021-11-25 (×4): 50 ug via INTRAVENOUS
  Administered 2021-11-25: 500 ug via INTRAVENOUS
  Administered 2021-11-25 (×2): 100 ug via INTRAVENOUS

## 2021-11-25 MED ORDER — MIDAZOLAM HCL 2 MG/2ML IJ SOLN: 2.0000 mg | INTRAMUSCULAR | Status: DC | PRN

## 2021-11-25 MED ORDER — ARTIFICIAL TEARS OPHTHALMIC OINT
TOPICAL_OINTMENT | OPHTHALMIC | Status: DC | PRN
Start: 2021-11-25 — End: 2021-11-25
  Administered 2021-11-25: 1 via OPHTHALMIC

## 2021-11-25 MED ORDER — ONDANSETRON HCL 4 MG/2ML IJ SOLN: 4.0000 mg | Freq: Four times a day (QID) | INTRAMUSCULAR | Status: DC | PRN

## 2021-11-25 MED ORDER — PROTAMINE SULFATE 10 MG/ML IV SOLN
INTRAVENOUS | Status: DC | PRN
  Administered 2021-11-25: 350 mg via INTRAVENOUS

## 2021-11-25 MED ORDER — CHLORHEXIDINE GLUCONATE 0.12 % MT SOLN
15.0000 mL | OROMUCOSAL | Status: AC
  Administered 2021-11-25: 15 mL via OROMUCOSAL

## 2021-11-25 MED ORDER — ROCURONIUM BROMIDE 10 MG/ML (PF) SYRINGE
PREFILLED_SYRINGE | INTRAVENOUS | Status: DC | PRN
  Administered 2021-11-25 (×6): 50 mg via INTRAVENOUS

## 2021-11-25 MED ORDER — MIDAZOLAM HCL (PF) 5 MG/ML IJ SOLN
INTRAMUSCULAR | Status: DC | PRN
Start: 2021-11-25 — End: 2021-11-25
  Administered 2021-11-25: 3 mg via INTRAVENOUS
  Administered 2021-11-25 (×2): 1 mg via INTRAVENOUS

## 2021-11-25 MED ORDER — SODIUM CHLORIDE 0.9% FLUSH: 3.0000 mL | INTRAVENOUS | Status: DC | PRN

## 2021-11-25 MED ORDER — SODIUM CHLORIDE 0.9 % IV SOLN: 250.0000 mL | INTRAVENOUS | Status: DC

## 2021-11-25 MED ORDER — HEPARIN SODIUM (PORCINE) 1000 UNIT/ML IJ SOLN
INTRAMUSCULAR | Status: AC
  Filled 2021-11-25: qty 1

## 2021-11-25 MED ORDER — PANTOPRAZOLE SODIUM 40 MG PO TBEC
40.0000 mg | DELAYED_RELEASE_TABLET | Freq: Every day | ORAL | Status: DC
  Administered 2021-11-27 – 2021-11-29 (×3): 40 mg via ORAL
  Filled 2021-11-25 (×3): qty 1

## 2021-11-25 MED ORDER — FAMOTIDINE IN NACL 20-0.9 MG/50ML-% IV SOLN
20.0000 mg | Freq: Two times a day (BID) | INTRAVENOUS | Status: AC
  Administered 2021-11-25 (×2): 20 mg via INTRAVENOUS
  Filled 2021-11-25 (×2): qty 50

## 2021-11-25 MED ORDER — PROPOFOL 10 MG/ML IV BOLUS
INTRAVENOUS | Status: DC | PRN
Start: 2021-11-25 — End: 2021-11-25
  Administered 2021-11-25: 60 mg via INTRAVENOUS

## 2021-11-25 MED ORDER — ROCURONIUM BROMIDE 10 MG/ML (PF) SYRINGE
PREFILLED_SYRINGE | INTRAVENOUS | Status: AC
  Filled 2021-11-25: qty 40

## 2021-11-25 MED ORDER — LACTATED RINGERS IV SOLN
500.0000 mL | Freq: Once | INTRAVENOUS | Status: AC | PRN
  Administered 2021-11-25: 500 mL via INTRAVENOUS

## 2021-11-25 MED ORDER — NITROGLYCERIN IN D5W 200-5 MCG/ML-% IV SOLN: 0.0000 ug/min | INTRAVENOUS | Status: DC

## 2021-11-25 MED ORDER — METOPROLOL TARTRATE 25 MG/10 ML ORAL SUSPENSION: 12.5000 mg | Freq: Two times a day (BID) | ORAL | Status: DC

## 2021-11-25 MED ORDER — MIDAZOLAM HCL (PF) 10 MG/2ML IJ SOLN
INTRAMUSCULAR | Status: AC
  Filled 2021-11-25: qty 2

## 2021-11-25 MED ORDER — POTASSIUM CHLORIDE 10 MEQ/50ML IV SOLN
10.0000 meq | INTRAVENOUS | Status: AC
  Administered 2021-11-25 (×3): 10 meq via INTRAVENOUS

## 2021-11-25 MED ORDER — NITROGLYCERIN IN D5W 200-5 MCG/ML-% IV SOLN
INTRAVENOUS | Status: DC | PRN
  Administered 2021-11-25: 20 ug/min via INTRAVENOUS

## 2021-11-25 MED ORDER — CHLORHEXIDINE GLUCONATE CLOTH 2 % EX PADS
6.0000 | MEDICATED_PAD | Freq: Every day | CUTANEOUS | Status: DC
  Administered 2021-11-25 – 2021-11-27 (×3): 6 via TOPICAL

## 2021-11-25 MED ORDER — HEMOSTATIC AGENTS (NO CHARGE) OPTIME
TOPICAL | Status: DC | PRN
  Administered 2021-11-25 (×2): 1 via TOPICAL

## 2021-11-25 MED ORDER — PHENYLEPHRINE HCL-NACL 20-0.9 MG/250ML-% IV SOLN: 0.0000 ug/min | INTRAVENOUS | Status: DC

## 2021-11-25 MED ORDER — ALBUMIN HUMAN 5 % IV SOLN: INTRAVENOUS | Status: DC | PRN

## 2021-11-25 MED ORDER — HEPARIN SODIUM (PORCINE) 1000 UNIT/ML IJ SOLN
INTRAMUSCULAR | Status: DC | PRN
Start: 2021-11-25 — End: 2021-11-25
  Administered 2021-11-25: 33000 [IU] via INTRAVENOUS
  Administered 2021-11-25: 2000 [IU] via INTRAVENOUS

## 2021-11-25 MED ORDER — ASPIRIN 81 MG PO CHEW: 324.0000 mg | CHEWABLE_TABLET | Freq: Every day | ORAL | Status: DC

## 2021-11-25 MED ORDER — ALBUMIN HUMAN 5 % IV SOLN
250.0000 mL | INTRAVENOUS | Status: AC | PRN
  Administered 2021-11-25 (×4): 12.5 g via INTRAVENOUS
  Filled 2021-11-25: qty 250

## 2021-11-25 MED ORDER — INSULIN REGULAR(HUMAN) IN NACL 100-0.9 UT/100ML-% IV SOLN
INTRAVENOUS | Status: DC | PRN
  Administered 2021-11-25: 3.8 [IU]/h via INTRAVENOUS

## 2021-11-25 MED ORDER — PHENYLEPHRINE 40 MCG/ML (10ML) SYRINGE FOR IV PUSH (FOR BLOOD PRESSURE SUPPORT)
PREFILLED_SYRINGE | INTRAVENOUS | Status: AC
  Filled 2021-11-25: qty 10

## 2021-11-25 MED ORDER — CEFAZOLIN SODIUM-DEXTROSE 2-4 GM/100ML-% IV SOLN
2.0000 g | Freq: Three times a day (TID) | INTRAVENOUS | Status: AC
  Administered 2021-11-25 – 2021-11-27 (×6): 2 g via INTRAVENOUS
  Filled 2021-11-25 (×6): qty 100

## 2021-11-25 MED ORDER — LACTATED RINGERS IV SOLN: INTRAVENOUS | Status: DC

## 2021-11-25 MED ORDER — ADULT MULTIVITAMIN W/MINERALS CH
1.0000 | ORAL_TABLET | Freq: Every day | ORAL | Status: DC
  Administered 2021-11-26 – 2021-11-29 (×4): 1 via ORAL
  Filled 2021-11-25 (×6): qty 1

## 2021-11-25 MED ORDER — SODIUM CHLORIDE 0.9 % IV SOLN: INTRAVENOUS | Status: DC

## 2021-11-25 MED ORDER — PROTAMINE SULFATE 10 MG/ML IV SOLN
INTRAVENOUS | Status: AC
  Filled 2021-11-25: qty 10

## 2021-11-25 MED ORDER — METOPROLOL TARTRATE 5 MG/5ML IV SOLN: 2.5000 mg | INTRAVENOUS | Status: DC | PRN

## 2021-11-25 MED ORDER — METOPROLOL TARTRATE 12.5 MG HALF TABLET: 12.5000 mg | ORAL_TABLET | Freq: Two times a day (BID) | ORAL | Status: DC

## 2021-11-25 MED ORDER — DEXMEDETOMIDINE HCL IN NACL 400 MCG/100ML IV SOLN
0.0000 ug/kg/h | INTRAVENOUS | Status: DC
  Administered 2021-11-25: 0.6 ug/kg/h via INTRAVENOUS
  Filled 2021-11-25: qty 100

## 2021-11-25 MED ORDER — PAPAVERINE HCL 30 MG/ML IJ SOLN
INTRAMUSCULAR | Status: DC | PRN
  Administered 2021-11-25: 1000 mL

## 2021-11-25 MED ORDER — HEPARIN SODIUM (PORCINE) 1000 UNIT/ML IJ SOLN
INTRAMUSCULAR | Status: AC
  Filled 2021-11-25: qty 10

## 2021-11-25 MED ORDER — SODIUM CHLORIDE 0.9% FLUSH
3.0000 mL | Freq: Two times a day (BID) | INTRAVENOUS | Status: DC
  Administered 2021-11-26 – 2021-11-27 (×3): 3 mL via INTRAVENOUS

## 2021-11-25 MED ORDER — PHENYLEPHRINE 40 MCG/ML (10ML) SYRINGE FOR IV PUSH (FOR BLOOD PRESSURE SUPPORT)
PREFILLED_SYRINGE | INTRAVENOUS | Status: DC | PRN
  Administered 2021-11-25 (×2): 40 ug via INTRAVENOUS

## 2021-11-25 MED ORDER — PHENYLEPHRINE HCL-NACL 20-0.9 MG/250ML-% IV SOLN
INTRAVENOUS | Status: DC | PRN
  Administered 2021-11-25: 20 ug/min via INTRAVENOUS

## 2021-11-25 MED ORDER — DEXMEDETOMIDINE HCL IN NACL 400 MCG/100ML IV SOLN
INTRAVENOUS | Status: DC | PRN
  Administered 2021-11-25: .4 ug/kg/h via INTRAVENOUS

## 2021-11-25 MED ORDER — OXYCODONE HCL 5 MG PO TABS
5.0000 mg | ORAL_TABLET | ORAL | Status: DC | PRN
  Administered 2021-11-25 – 2021-11-28 (×6): 10 mg via ORAL
  Filled 2021-11-25 (×6): qty 2

## 2021-11-25 MED ORDER — PROTAMINE SULFATE 10 MG/ML IV SOLN
INTRAVENOUS | Status: AC
  Filled 2021-11-25: qty 25

## 2021-11-25 MED ORDER — INSULIN REGULAR(HUMAN) IN NACL 100-0.9 UT/100ML-% IV SOLN: INTRAVENOUS | Status: AC

## 2021-11-25 SURGICAL SUPPLY — 94 items
ADH SKN CLS APL DERMABOND .7 (GAUZE/BANDAGES/DRESSINGS) ×3
BAG DECANTER FOR FLEXI CONT (MISCELLANEOUS) ×5 IMPLANT
BLADE CLIPPER SURG (BLADE) ×5 IMPLANT
BLADE STERNUM SYSTEM 6 (BLADE) ×5 IMPLANT
BNDG ELASTIC 4X5.8 VLCR STR LF (GAUZE/BANDAGES/DRESSINGS) ×5 IMPLANT
BNDG ELASTIC 6X5.8 VLCR STR LF (GAUZE/BANDAGES/DRESSINGS) ×5 IMPLANT
BNDG GAUZE ELAST 4 BULKY (GAUZE/BANDAGES/DRESSINGS) ×5 IMPLANT
CANISTER SUCT 3000ML PPV (MISCELLANEOUS) ×5 IMPLANT
CANNULA EZ GLIDE AORTIC 21FR (CANNULA) ×5 IMPLANT
CATH CPB KIT HENDRICKSON (MISCELLANEOUS) ×5 IMPLANT
CATH ROBINSON RED A/P 18FR (CATHETERS) ×5 IMPLANT
CATH THORACIC 36FR (CATHETERS) ×5 IMPLANT
CATH THORACIC 36FR RT ANG (CATHETERS) ×5 IMPLANT
CLIP FOGARTY SPRING 6M (CLIP) ×1 IMPLANT
CLIP TI MEDIUM 24 (CLIP) ×1 IMPLANT
CLIP TI WIDE RED SMALL 24 (CLIP) ×3 IMPLANT
CLIP VESOCCLUDE MED 24/CT (CLIP) IMPLANT
CLIP VESOCCLUDE SM WIDE 24/CT (CLIP) IMPLANT
CONTAINER PROTECT SURGISLUSH (MISCELLANEOUS) ×10 IMPLANT
DERMABOND ADVANCED (GAUZE/BANDAGES/DRESSINGS) ×1
DERMABOND ADVANCED .7 DNX12 (GAUZE/BANDAGES/DRESSINGS) IMPLANT
DRAPE CARDIOVASCULAR INCISE (DRAPES) ×4
DRAPE SRG 135X102X78XABS (DRAPES) ×4 IMPLANT
DRAPE WARM FLUID 44X44 (DRAPES) ×5 IMPLANT
DRSG COVADERM 4X14 (GAUZE/BANDAGES/DRESSINGS) ×5 IMPLANT
ELECT REM PT RETURN 9FT ADLT (ELECTROSURGICAL) ×8
ELECTRODE REM PT RTRN 9FT ADLT (ELECTROSURGICAL) ×8 IMPLANT
FELT TEFLON 1X6 (MISCELLANEOUS) ×9 IMPLANT
GAUZE SPONGE 4X4 12PLY STRL (GAUZE/BANDAGES/DRESSINGS) ×10 IMPLANT
GAUZE SPONGE 4X4 12PLY STRL LF (GAUZE/BANDAGES/DRESSINGS) ×2 IMPLANT
GLOVE SURG MICRO LTX SZ6 (GLOVE) ×5 IMPLANT
GLOVE SURG MICRO LTX SZ6.5 (GLOVE) ×4 IMPLANT
GLOVE SURG SIGNA 7.5 PF LTX (GLOVE) ×15 IMPLANT
GOWN STRL REUS W/ TWL LRG LVL3 (GOWN DISPOSABLE) ×16 IMPLANT
GOWN STRL REUS W/ TWL XL LVL3 (GOWN DISPOSABLE) ×8 IMPLANT
GOWN STRL REUS W/TWL LRG LVL3 (GOWN DISPOSABLE) ×16
GOWN STRL REUS W/TWL XL LVL3 (GOWN DISPOSABLE) ×8
HEMOSTAT POWDER SURGIFOAM 1G (HEMOSTASIS) ×15 IMPLANT
HEMOSTAT SURGICEL 2X14 (HEMOSTASIS) ×6 IMPLANT
INSERT FOGARTY XLG (MISCELLANEOUS) IMPLANT
KIT BASIN OR (CUSTOM PROCEDURE TRAY) ×5 IMPLANT
KIT SUCTION CATH 14FR (SUCTIONS) ×10 IMPLANT
KIT TURNOVER KIT B (KITS) ×5 IMPLANT
KIT VASOVIEW HEMOPRO 2 VH 4000 (KITS) ×5 IMPLANT
MARKER GRAFT CORONARY BYPASS (MISCELLANEOUS) ×15 IMPLANT
NS IRRIG 1000ML POUR BTL (IV SOLUTION) ×25 IMPLANT
PACK E OPEN HEART (SUTURE) ×5 IMPLANT
PACK OPEN HEART (CUSTOM PROCEDURE TRAY) ×5 IMPLANT
PAD ARMBOARD 7.5X6 YLW CONV (MISCELLANEOUS) ×10 IMPLANT
PAD ELECT DEFIB RADIOL ZOLL (MISCELLANEOUS) ×5 IMPLANT
PENCIL BUTTON HOLSTER BLD 10FT (ELECTRODE) ×5 IMPLANT
POSITIONER HEAD DONUT 9IN (MISCELLANEOUS) ×5 IMPLANT
PUNCH AORTIC ROTATE  4.5MM 8IN (MISCELLANEOUS) ×1 IMPLANT
PUNCH AORTIC ROTATE 4.0MM (MISCELLANEOUS) IMPLANT
PUNCH AORTIC ROTATE 4.5MM 8IN (MISCELLANEOUS) IMPLANT
PUNCH AORTIC ROTATE 5MM 8IN (MISCELLANEOUS) IMPLANT
SCRUB TECHNI CARE 4 OZ NO DYE (MISCELLANEOUS) ×2 IMPLANT
SET MPS 3-ND DEL (MISCELLANEOUS) ×1 IMPLANT
SHEARS HARMONIC 9CM CVD (BLADE) IMPLANT
SOL PREP POV-IOD 4OZ 10% (MISCELLANEOUS) ×2 IMPLANT
SUPPORT HEART JANKE-BARRON (MISCELLANEOUS) ×5 IMPLANT
SUT BONE WAX W31G (SUTURE) ×5 IMPLANT
SUT MNCRL AB 4-0 PS2 18 (SUTURE) ×1 IMPLANT
SUT PROLENE 3 0 SH DA (SUTURE) ×5 IMPLANT
SUT PROLENE 4 0 RB 1 (SUTURE) ×4
SUT PROLENE 4 0 SH DA (SUTURE) ×1 IMPLANT
SUT PROLENE 4-0 RB1 .5 CRCL 36 (SUTURE) IMPLANT
SUT PROLENE 6 0 C 1 24 (SUTURE) ×1 IMPLANT
SUT PROLENE 6 0 C 1 30 (SUTURE) ×12 IMPLANT
SUT PROLENE 7 0 BV 1 (SUTURE) ×1 IMPLANT
SUT PROLENE 7 0 BV1 MDA (SUTURE) ×7 IMPLANT
SUT PROLENE 8 0 BV175 6 (SUTURE) IMPLANT
SUT STEEL 6MS V (SUTURE) ×5 IMPLANT
SUT STEEL STERNAL CCS#1 18IN (SUTURE) IMPLANT
SUT STEEL SZ 6 DBL 3X14 BALL (SUTURE) ×5 IMPLANT
SUT VIC AB 1 CTX 36 (SUTURE) ×8
SUT VIC AB 1 CTX36XBRD ANBCTR (SUTURE) ×8 IMPLANT
SUT VIC AB 2-0 CT1 27 (SUTURE) ×4
SUT VIC AB 2-0 CT1 TAPERPNT 27 (SUTURE) IMPLANT
SUT VIC AB 2-0 CTX 27 (SUTURE) IMPLANT
SUT VIC AB 3-0 SH 27 (SUTURE)
SUT VIC AB 3-0 SH 27X BRD (SUTURE) IMPLANT
SUT VIC AB 3-0 X1 27 (SUTURE) IMPLANT
SUT VICRYL 4-0 PS2 18IN ABS (SUTURE) IMPLANT
SYSTEM SAHARA CHEST DRAIN ATS (WOUND CARE) ×5 IMPLANT
TAPE CLOTH SURG 4X10 WHT LF (GAUZE/BANDAGES/DRESSINGS) ×2 IMPLANT
TAPE PAPER 2X10 WHT MICROPORE (GAUZE/BANDAGES/DRESSINGS) ×1 IMPLANT
TOWEL GREEN STERILE (TOWEL DISPOSABLE) ×5 IMPLANT
TOWEL GREEN STERILE FF (TOWEL DISPOSABLE) ×5 IMPLANT
TRAY FOLEY SLVR 16FR TEMP STAT (SET/KITS/TRAYS/PACK) ×5 IMPLANT
TUBE SUCT INTRACARD DLP 20F (MISCELLANEOUS) ×1 IMPLANT
TUBING LAP HI FLOW INSUFFLATIO (TUBING) ×5 IMPLANT
UNDERPAD 30X36 HEAVY ABSORB (UNDERPADS AND DIAPERS) ×5 IMPLANT
WATER STERILE IRR 1000ML POUR (IV SOLUTION) ×10 IMPLANT

## 2021-11-25 NOTE — Discharge Instructions (Signed)

## 2021-11-25 NOTE — Anesthesia Procedure Notes (Signed)
Arterial Line Insertion Start/End1/02/2022 7:50 AM, 11/25/2021 7:55 AM  Patient location: Pre-op. Preanesthetic checklist: patient identified, IV checked, site marked, risks and benefits discussed, surgical consent, monitors and equipment checked, pre-op evaluation, timeout performed and anesthesia consent Lidocaine 1% used for infiltration Left, radial was placed Catheter size: 20 G Hand hygiene performed  and maximum sterile barriers used   Attempts: 1 Procedure performed without using ultrasound guided technique. Following insertion, dressing applied and Biopatch. Post procedure assessment: normal and unchanged  Patient tolerated the procedure well with no immediate complications.

## 2021-11-25 NOTE — Hospital Course (Addendum)
HPI: This is a 66 year old male admitted following an NSTEMI.  He underwent a left heart cath which showed three-vessel coronary artery disease.  The patient states that he originally had sharp back pain while sitting at a desk earlier today.  This lasted for several hours once EMS was called this was relieved with sublingual nitroglycerin.  He admits to a several week history of anginal symptoms.  He has been able to keep up with this activity however.  He played golf 2 times last week without any symptoms. Dr. Kipp Brood discussed the need for coronary artery bypass grafting surgery. Potential risks, benefits, and complications of the surgery were discussed with the patient and he agreed to proceed with surgery. Pre operative carotid duplex US shows no significant internal carotid artery stenoses bilaterally.  Hospital Course: Patient underwent a CABG x 4. He was transferred from the OR to [facility] ICU in stable condition. He was extubated successfully early the evening of surgery. He remained afebrile and hemodynamically stable. He was weaned off Nitro drip. Gordy Councilman, a line, chest tubes, and foley were all removed early in his post operative course. He was started on Lopressor. He was volume overloaded and diuresed accordingly. He was transitioned off the Insulin drip. Of note, his pre op HGA1C is 7.1. He is diet controlled.  Epicardial pacing wires were removed prior to starting Plavix. Patient was felt surgically stable for transfer from the ICU to 4E for further convalescence on 11/27/2021. Epicardial pacing wires were removed on 01/07. He was started on Plavix and continue on baby ec asa 81 mg daily for NSTEMI. Lopressor was increased to 37.5 mg bid for better HR control. He has been ambulating on room air with good oxygenation. He has been tolerating a diet and has had a bowel movement. All wounds are clean, dry, healing without signs of infection.

## 2021-11-25 NOTE — Progress Notes (Signed)
CT surgery p.m. Rounds  Patient extubated and doing well after multivessel CABG Maintaining sinus rhythm with minimal chest tube output Blood pressure stable on low-dose neo- Post extubation gas within parameters  Vitals:   11/25/21 1859 11/25/21 1900  BP:    Pulse: 80 80  Resp: 13 16  Temp: 97.7 F (36.5 C) 97.7 F (36.5 C)  SpO2: 98% 98%

## 2021-11-25 NOTE — Interval H&P Note (Signed)
History and Physical Interval Note:  Patient seen and examined, cath images reviewed Has severe 3 vessel CAD, CABG indicated for survival benefit and relief of symptoms  He is aware of the indications, risks, benefits and alternatives. He is aware risks include but are not limited to death, MI, DVT, PE, stroke, bleeding, possible need for transfusion, infection, cardiac arrhythmias, as well as other organ system dysfunction. He wishes to proceed.  11/25/2021 8:11 AM  Stephen Shelton  has presented today for surgery, with the diagnosis of Coronary artery disease.  The various methods of treatment have been discussed with the patient and family. After consideration of risks, benefits and other options for treatment, the patient has consented to  Procedure(s) with comments: CORONARY ARTERY BYPASS GRAFTING (CABG) (Right) - Right radial artery harvest as a surgical intervention.  The patient's history has been reviewed, patient examined, no change in status, stable for surgery.  I have reviewed the patient's chart and labs.  Questions were answered to the patient's satisfaction.     Melrose Nakayama

## 2021-11-25 NOTE — Procedures (Signed)
Extubation Procedure Note  Patient Details:   Name: ENCARNACION SCIONEAUX DOB: 12/10/1955 MRN: [id number]   Airway Documentation:  + cuff leak test.  NIF -30, VC 1100 ml.     Vent end date: 11/25/21 Vent end time: 1805   Evaluation  O2 sats: stable throughout Complications: No apparent complications Patient did tolerate procedure well. Bilateral Breath Sounds: Clear, Diminished   Yes pt able to speak, no stridor noted.  No distress noted.    Lenna Sciara 11/25/2021, 6:18 PM

## 2021-11-25 NOTE — Progress Notes (Signed)
°  Echocardiogram Echocardiogram Transesophageal has been performed.  Stephen Shelton 11/25/2021, 9:45 AM

## 2021-11-25 NOTE — Anesthesia Postprocedure Evaluation (Signed)
Anesthesia Post Note  Patient: Aariv [employer]  Procedure(s) Performed: CORONARY ARTERY BYPASS GRAFTING (CABG) X 4  ON PUMP USING LEFT INTERNAL MAMMARY ARTERY AND RIGHT ENDOSCOPIC GREATER SAPHENOUS VEIN CONDUITS (Right: Chest) ENDOVEIN HARVEST OF GREATER SAPHENOUS VEIN (Right) APPLICATION OF CELL SAVER     Patient location during evaluation: SICU Anesthesia Type: General Level of consciousness: sedated Pain management: pain level controlled Vital Signs Assessment: post-procedure vital signs reviewed and stable Respiratory status: patient remains intubated per anesthesia plan Cardiovascular status: stable Postop Assessment: no apparent nausea or vomiting Anesthetic complications: no   No notable events documented.  Last Vitals:  Vitals:   11/25/21 1400 11/25/21 1415  BP:    Pulse: 80 80  Resp: 16 16  Temp: 36.6 C 36.5 C  SpO2: 95% 97%    Last Pain:  Vitals:   11/25/21 0430  TempSrc: Oral  PainSc:                  Orlen Leedy,W. EDMOND

## 2021-11-25 NOTE — Progress Notes (Signed)
RN called to initiate rapid wean protocol.  Attempted to start weaning, pt developed low RR and low minute volume x 3 attempts.  Pt back on full support, RN aware.

## 2021-11-25 NOTE — Anesthesia Procedure Notes (Signed)
Procedure Name: Intubation Date/Time: 11/25/2021 8:46 AM Performed by: Thelma Comp, CRNA Pre-anesthesia Checklist: Patient identified, Emergency Drugs available, Suction available and Patient being monitored Patient Re-evaluated:Patient Re-evaluated prior to induction Oxygen Delivery Method: Circle System Utilized Preoxygenation: Pre-oxygenation with 100% oxygen Induction Type: IV induction Ventilation: Mask ventilation without difficulty and Oral airway inserted - appropriate to patient size Laryngoscope Size: Mac and 4 Grade View: Grade I Tube type: Oral Tube size: 8.0 mm Number of attempts: 1 Airway Equipment and Method: Stylet and Oral airway Placement Confirmation: ETT inserted through vocal cords under direct vision, positive ETCO2 and breath sounds checked- equal and bilateral Secured at: 23 cm Tube secured with: Tape Dental Injury: Teeth and Oropharynx as per pre-operative assessment

## 2021-11-25 NOTE — Anesthesia Procedure Notes (Signed)
Central Venous Catheter Insertion Performed by: Roderic Palau, MD, anesthesiologist Start/End1/02/2022 7:55 AM, 11/25/2021 8:10 AM Patient location: Pre-op. Preanesthetic checklist: patient identified, IV checked, site marked, risks and benefits discussed, surgical consent, monitors and equipment checked, pre-op evaluation, timeout performed and anesthesia consent Hand hygiene performed  and maximum sterile barriers used  PA cath was placed.Swan type:thermodilution PA Cath depth:50 Procedure performed without using ultrasound guided technique. Attempts: 1 Patient tolerated the procedure well with no immediate complications.

## 2021-11-25 NOTE — Anesthesia Procedure Notes (Signed)
Central Venous Catheter Insertion Performed by: Roderic Palau, MD, anesthesiologist Start/End1/02/2022 7:55 AM, 11/25/2021 8:10 AM Patient location: Pre-op. Preanesthetic checklist: patient identified, IV checked, site marked, risks and benefits discussed, surgical consent, monitors and equipment checked, pre-op evaluation, timeout performed and anesthesia consent Position: Trendelenburg Lidocaine 1% used for infiltration and patient sedated Hand hygiene performed , maximum sterile barriers used  and Seldinger technique used Catheter size: 9 Fr Total catheter length 10. Central line was placed.MAC introducer Procedure performed using ultrasound guided technique. Ultrasound Notes:anatomy identified, needle tip was noted to be adjacent to the nerve/plexus identified, no ultrasound evidence of intravascular and/or intraneural injection and image(s) printed for medical record Attempts: 1 Following insertion, line sutured, dressing applied and Biopatch. Post procedure assessment: blood return through all ports, free fluid flow and no air  Patient tolerated the procedure well with no immediate complications.

## 2021-11-25 NOTE — Transfer of Care (Signed)
Immediate Anesthesia Transfer of Care Note  Patient: Stephen Shelton  Procedure(s) Performed: CORONARY ARTERY BYPASS GRAFTING (CABG) X 4  ON PUMP USING LEFT INTERNAL MAMMARY ARTERY AND RIGHT ENDOSCOPIC GREATER SAPHENOUS VEIN CONDUITS (Right: Chest) ENDOVEIN HARVEST OF GREATER SAPHENOUS VEIN (Right) APPLICATION OF CELL SAVER  Patient Location: SICU  Anesthesia Type:General  Level of Consciousness: Patient remains intubated per anesthesia plan  Airway & Oxygen Therapy: Patient remains intubated per anesthesia plan and Patient placed on Ventilator (see vital sign flow sheet for setting)  Post-op Assessment: Report given to RN and Post -op Vital signs reviewed and stable  Post vital signs: Reviewed and stable  Last Vitals:  Vitals Value Taken Time  BP 108/75 11/25/21 1337  Temp    Pulse 80 11/25/21 1337  Resp 13 11/25/21 1337  SpO2 95 % 11/25/21 1337  Vitals shown include unvalidated device data.  Last Pain:  Vitals:   11/25/21 0430  TempSrc: Oral  PainSc:          Complications: No notable events documented.

## 2021-11-25 NOTE — Brief Op Note (Signed)
11/23/2021 - 11/25/2021  11:52 AM  PATIENT:  Stephen Shelton  66 y.o. male  PRE-OPERATIVE DIAGNOSIS:  1. S/p NSTEMI 2.Coronary artery disease  POST-OPERATIVE DIAGNOSIS:  1. S/p NSTEMI 2. Coronary artery disease  PROCEDURE:   CORONARY ARTERY BYPASS GRAFTING (CABG) X  4 (LIMA to LAD, SVG to DIAGONAL, SVG to OM, SVG to PDA) ON PUMP USING LEFT INTERNAL MAMMARY ARTERY AND RIGHT ENDOSCOPIC GREATER SAPHENOUS VEIN CONDUITS, ENDOVEIN HARVEST OF RIGHT GREATER SAPHENOUS VEIN, APPLICATION OF CELL SAVER  SURGEON:  Surgeon(s) and Role:    Melrose Nakayama, MD - Primary  PHYSICIAN ASSISTANT: Lars Pinks PA-C  ASSISTANTS: Ardyth Man RNFA   ANESTHESIA:   general  EBL:  Per anesthesia, perfusion record  DRAINS:  Chest tubes placed in the mediastinal and pleural spaces    COUNTS CORRECT:  YES  DICTATION: .Dragon Dictation  PLAN OF CARE: Admit to inpatient   PATIENT DISPOSITION:  ICU - intubated and hemodynamically stable.   Delay start of Pharmacological VTE agent (>24hrs) due to surgical blood loss or risk of bleeding: yes  BASELINE WEIGHT: 98.2 kg

## 2021-11-25 NOTE — Progress Notes (Signed)
SICU rapid wean protocol initiated.  Pt appears to be tol well currently.

## 2021-11-26 ENCOUNTER — Encounter (HOSPITAL_COMMUNITY): Payer: Self-pay | Admitting: Thoracic Surgery (Cardiothoracic Vascular Surgery)

## 2021-11-26 ENCOUNTER — Inpatient Hospital Stay (HOSPITAL_COMMUNITY): Payer: HMO

## 2021-11-26 LAB — BASIC METABOLIC PANEL
Anion gap: 4 — ABNORMAL LOW (ref 5–15)
Anion gap: 5 (ref 5–15)
BUN: 12 mg/dL (ref 8–23)
BUN: 16 mg/dL (ref 8–23)
CO2: 21 mmol/L — ABNORMAL LOW (ref 22–32)
CO2: 25 mmol/L (ref 22–32)
Calcium: 8.1 mg/dL — ABNORMAL LOW (ref 8.9–10.3)
Calcium: 8.7 mg/dL — ABNORMAL LOW (ref 8.9–10.3)
Chloride: 106 mmol/L (ref 98–111)
Chloride: 101 mmol/L (ref 98–111)
Creatinine, Ser: 0.59 mg/dL — ABNORMAL LOW (ref 0.61–1.24)
Creatinine, Ser: 0.8 mg/dL (ref 0.61–1.24)
GFR, Estimated: >60 mL/min (ref >60)
GFR, Estimated: >60 mL/min (ref >60)
Glucose, Bld: 113 mg/dL — ABNORMAL HIGH (ref 70–99)
Glucose, Bld: 171 mg/dL — ABNORMAL HIGH (ref 70–99)
Potassium: 3.8 mmol/L (ref 3.5–5.1)
Potassium: 4.1 mmol/L (ref 3.5–5.1)
Sodium: 131 mmol/L — ABNORMAL LOW (ref 135–145)
Sodium: 131 mmol/L — ABNORMAL LOW (ref 135–145)

## 2021-11-26 LAB — CBC
HCT: 33.7 % — ABNORMAL LOW (ref 39.0–52.0)
HCT: 33.9 % — ABNORMAL LOW (ref 39.0–52.0)
Hemoglobin: 11.4 g/dL — ABNORMAL LOW (ref 13.0–17.0)
Hemoglobin: 11.7 g/dL — ABNORMAL LOW (ref 13.0–17.0)
MCH: 29.4 pg (ref 26.0–34.0)
MCH: 30.2 pg (ref 26.0–34.0)
MCHC: 33.8 g/dL (ref 30.0–36.0)
MCHC: 34.5 g/dL (ref 30.0–36.0)
MCV: 86.9 fL (ref 80.0–100.0)
MCV: 87.6 fL (ref 80.0–100.0)
Platelets: 139 10*3/uL — ABNORMAL LOW (ref 150–400)
Platelets: 141 10*3/uL — ABNORMAL LOW (ref 150–400)
RBC: 3.88 MIL/uL — ABNORMAL LOW (ref 4.22–5.81)
RBC: 3.87 MIL/uL — ABNORMAL LOW (ref 4.22–5.81)
RDW: 14.2 % (ref 11.5–15.5)
RDW: 14.4 % (ref 11.5–15.5)
WBC: 13.6 10*3/uL — ABNORMAL HIGH (ref 4.0–10.5)
WBC: 13.9 10*3/uL — ABNORMAL HIGH (ref 4.0–10.5)
nRBC: 0 % (ref 0.0–0.2)
nRBC: 0 % (ref 0.0–0.2)

## 2021-11-26 LAB — GLUCOSE, CAPILLARY
Glucose-Capillary: 106 mg/dL — ABNORMAL HIGH (ref 70–99)
Glucose-Capillary: 113 mg/dL — ABNORMAL HIGH (ref 70–99)
Glucose-Capillary: 114 mg/dL — ABNORMAL HIGH (ref 70–99)
Glucose-Capillary: 118 mg/dL — ABNORMAL HIGH (ref 70–99)
Glucose-Capillary: 118 mg/dL — ABNORMAL HIGH (ref 70–99)
Glucose-Capillary: 127 mg/dL — ABNORMAL HIGH (ref 70–99)
Glucose-Capillary: 129 mg/dL — ABNORMAL HIGH (ref 70–99)
Glucose-Capillary: 129 mg/dL — ABNORMAL HIGH (ref 70–99)
Glucose-Capillary: 135 mg/dL — ABNORMAL HIGH (ref 70–99)
Glucose-Capillary: 151 mg/dL — ABNORMAL HIGH (ref 70–99)
Glucose-Capillary: 169 mg/dL — ABNORMAL HIGH (ref 70–99)
Glucose-Capillary: 171 mg/dL — ABNORMAL HIGH (ref 70–99)
Glucose-Capillary: 174 mg/dL — ABNORMAL HIGH (ref 70–99)

## 2021-11-26 LAB — MAGNESIUM
Magnesium: 2.2 mg/dL (ref 1.7–2.4)
Magnesium: 2.3 mg/dL (ref 1.7–2.4)

## 2021-11-26 MED ORDER — LISINOPRIL 5 MG PO TABS
5.0000 mg | ORAL_TABLET | Freq: Every day | ORAL | Status: DC
  Administered 2021-11-26 – 2021-11-29 (×4): 5 mg via ORAL
  Filled 2021-11-26 (×4): qty 1

## 2021-11-26 MED ORDER — ENOXAPARIN SODIUM 40 MG/0.4ML IJ SOSY
40.0000 mg | PREFILLED_SYRINGE | Freq: Every day | INTRAMUSCULAR | Status: DC
  Administered 2021-11-26 – 2021-11-28 (×3): 40 mg via SUBCUTANEOUS
  Filled 2021-11-26 (×3): qty 0.4

## 2021-11-26 MED ORDER — METOPROLOL TARTRATE 25 MG PO TABS
25.0000 mg | ORAL_TABLET | Freq: Two times a day (BID) | ORAL | Status: DC
  Administered 2021-11-26 – 2021-11-27 (×4): 25 mg via ORAL
  Filled 2021-11-26 (×4): qty 1

## 2021-11-26 MED ORDER — METOPROLOL TARTRATE 25 MG/10 ML ORAL SUSPENSION: 25.0000 mg | Freq: Two times a day (BID) | ORAL | Status: DC

## 2021-11-26 MED ORDER — ASPIRIN 81 MG PO CHEW: 81.0000 mg | CHEWABLE_TABLET | Freq: Every day | ORAL | Status: DC

## 2021-11-26 MED ORDER — ATORVASTATIN CALCIUM 80 MG PO TABS
80.0000 mg | ORAL_TABLET | Freq: Every day | ORAL | Status: DC
  Administered 2021-11-26 – 2021-11-28 (×3): 80 mg via ORAL
  Filled 2021-11-26 (×3): qty 1

## 2021-11-26 MED ORDER — ASPIRIN EC 81 MG PO TBEC
81.0000 mg | DELAYED_RELEASE_TABLET | Freq: Every day | ORAL | Status: DC
  Administered 2021-11-26 – 2021-11-29 (×4): 81 mg via ORAL
  Filled 2021-11-26 (×4): qty 1

## 2021-11-26 MED ORDER — INSULIN DETEMIR 100 UNIT/ML ~~LOC~~ SOLN
15.0000 [IU] | Freq: Two times a day (BID) | SUBCUTANEOUS | Status: AC
  Administered 2021-11-26 (×2): 15 [IU] via SUBCUTANEOUS
  Filled 2021-11-26 (×2): qty 0.15

## 2021-11-26 MED ORDER — INSULIN ASPART 100 UNIT/ML IJ SOLN
0.0000 [IU] | INTRAMUSCULAR | Status: DC
  Administered 2021-11-26 (×2): 4 [IU] via SUBCUTANEOUS
  Administered 2021-11-26 (×2): 2 [IU] via SUBCUTANEOUS

## 2021-11-26 MED ORDER — POTASSIUM CHLORIDE 10 MEQ/50ML IV SOLN
10.0000 meq | INTRAVENOUS | Status: AC
  Administered 2021-11-26 (×3): 10 meq via INTRAVENOUS
  Filled 2021-11-26 (×3): qty 50

## 2021-11-26 MED ORDER — POTASSIUM CHLORIDE CRYS ER 20 MEQ PO TBCR
20.0000 meq | EXTENDED_RELEASE_TABLET | Freq: Once | ORAL | Status: AC
  Administered 2021-11-26: 20 meq via ORAL
  Filled 2021-11-26: qty 1

## 2021-11-26 MED ORDER — FUROSEMIDE 10 MG/ML IJ SOLN
40.0000 mg | Freq: Once | INTRAMUSCULAR | Status: AC
  Administered 2021-11-26: 40 mg via INTRAVENOUS
  Filled 2021-11-26: qty 4

## 2021-11-26 NOTE — Progress Notes (Signed)
Patient ID: Stephen Shelton, male   DOB: 11/15/1956, 66 y.o.   MRN: [id number]  TCTS Evening Rounds:   Hemodynamically stable   Urine output good   Ambulated around the ICU today  CBC    Component Value Date/Time   WBC 13.9 (H) 11/26/2021 1724   RBC 3.87 (L) 11/26/2021 1724   HGB 11.7 (L) 11/26/2021 1724   HCT 33.9 (L) 11/26/2021 1724   PLT 141 (L) 11/26/2021 1724   MCV 87.6 11/26/2021 1724   MCH 30.2 11/26/2021 1724   MCHC 34.5 11/26/2021 1724   RDW 14.4 11/26/2021 1724   LYMPHSABS 2.2 11/23/2021 2321   MONOABS 0.8 11/23/2021 2321   EOSABS 0.4 11/23/2021 2321   BASOSABS 0.1 11/23/2021 2321     BMET    Component Value Date/Time   NA 131 (L) 11/26/2021 1724   K 4.1 11/26/2021 1724   CL 101 11/26/2021 1724   CO2 25 11/26/2021 1724   GLUCOSE 171 (H) 11/26/2021 1724   BUN 16 11/26/2021 1724   BUN 12 12/26/2019 0000   CREATININE 0.80 11/26/2021 1724   CREATININE 0.64 07/08/2020 0000   CREATININE 0.64 07/08/2020 0000   CALCIUM 8.7 (L) 11/26/2021 1724   GFRNONAA >60 11/26/2021 1724     A/P:  Stable postop course. Continue current plans

## 2021-11-26 NOTE — Progress Notes (Signed)
Progress Note  Patient Name: Stephen Shelton Date of Encounter: 11/26/2021  Attending physician: Melrose Nakayama, MD Primary care provider: Tonia Ghent, MD Primary Cardiologist: Rex Kras, DO, [facility]  Subjective: Stephen Shelton is a 66 y.o. male who was seen and examined at bedside  Brother present at bedside. Chest tenderness at the incision site.  Denies anginal discomfort Extubated yesterday night (postop day 0).  Case discussed and reviewed with his nurse.  Objective: Vital Signs in the last 24 hours: Temp:  [97.5 F (36.4 C)-98.4 F (36.9 C)] 97.7 F (36.5 C) (01/05 0830) Pulse Rate:  [74-110] 84 (01/05 1730) Resp:  [13-29] 21 (01/05 1715) BP: (96-145)/(57-92) 124/68 (01/05 1730) SpO2:  [91 %-100 %] 95 % (01/05 1730) Arterial Line BP: (125-180)/(53-72) 166/72 (01/05 0830) Weight:  [105.6 kg] 105.6 kg (01/05 0500)  Intake/Output:  Intake/Output Summary (Last 24 hours) at 11/26/2021 1754 Last data filed at 11/26/2021 1500 Gross per 24 hour  Intake 1618.46 ml  Output 2168 ml  Net -549.54 ml    Net IO Since Admission: 395.85 mL [11/26/21 1754]  Weights:  Filed Weights   11/24/21 1129 11/24/21 1130 11/26/21 0500  Weight: 98 kg 98.2 kg 105.6 kg    Telemetry: Personally reviewed.  Sinus rhythm with intermittent pacing  Physical examination: PHYSICAL EXAM: Vitals with BMI 11/26/2021 11/26/2021 11/26/2021  Height - - -  Weight - - -  BMI - - -  Systolic 030 092 330  Diastolic 68 64 60  Pulse 84 90 79    CONSTITUTIONAL: Well-developed and well-nourished. No acute distress.  SKIN: Skin is warm and dry. No rash noted. No cyanosis. No pallor. No jaundice HEAD: Normocephalic and atraumatic.  EYES: No scleral icterus MOUTH/THROAT: Moist oral membranes.  NECK: No JVD present. No thyromegaly noted. No carotid bruits.  Right IJ Swan LYMPHATIC: No visible cervical adenopathy.  CHEST Normal respiratory effort. No intercostal retractions.  Chest tubes in place,  epicardial wires present.  Sternal dressing is clean and dry.   LUNGS: Clear to auscultation bilaterally upper lung fields with diminished breath sounds at the bases.  No stridor. No wheezes. No rales. CARDIOVASCULAR: Regular rate and rhythm, positive S1-S2, no murmurs rubs or gallops appreciated.  ABDOMINAL: Soft, nontender, nondistended, positive bowel sounds in all 4 quadrants, no apparent ascites.  EXTREMITIES: Trace bilateral pitting edema, warm to touch HEMATOLOGIC: No significant bruising NEUROLOGIC: Oriented to person, place, and time. Nonfocal. Normal muscle tone.  PSYCHIATRIC: Normal mood and affect. Normal behavior. Cooperative  Lab Results: Hematology Recent Labs  Lab 11/25/21 1849 11/25/21 1856 11/26/21 0300 11/26/21 1724  WBC 13.6*  --  13.6* 13.9*  RBC 4.21*  --  3.88* 3.87*  HGB 12.1* 12.2* 11.4* 11.7*  HCT 37.1* 36.0* 33.7* 33.9*  MCV 88.1  --  86.9 87.6  MCH 28.7  --  29.4 30.2  MCHC 32.6  --  33.8 34.5  RDW 14.0  --  14.2 14.4  PLT 137*  --  139* 141*    Chemistry Recent Labs  Lab 11/23/21 2322 11/25/21 0421 11/25/21 0857 11/25/21 1238 11/25/21 1242 11/25/21 1849 11/25/21 1856 11/26/21 0300  NA 137 136   < > 140   < > 136 139 131*  K 3.9 3.8   < > 4.1   < > 4.4 4.4 3.8  CL 106 104   < > 104  --  110  --  106  CO2 25 22  --   --   --  22  --  21*  GLUCOSE 192* 124*   < > 153*  --  115*  --  113*  BUN 12 10   < > 11  --  12  --  12  CREATININE 0.78 0.69   < > 0.50*  --  0.59*  --  0.59*  CALCIUM 8.9 9.0  --   --   --  8.0*  --  8.1*  PROT 6.2*  --   --   --   --   --   --   --   ALBUMIN 3.7  --   --   --   --   --   --   --   AST 29  --   --   --   --   --   --   --   ALT 21  --   --   --   --   --   --   --   ALKPHOS 53  --   --   --   --   --   --   --   BILITOT 0.7  --   --   --   --   --   --   --   GFRNONAA >60 >60  --   --   --  >60  --  >60  ANIONGAP 6 10  --   --   --  4*  --  4*   < > = values in this interval not displayed.      Cardiac Enzymes: Cardiac Panel (last 3 results) Recent Labs    11/24/21 0106 11/24/21 0906 11/24/21 1028  TROPONINIHS 6,016* 12,338* 10,229*    BNP (last 3 results) Recent Labs    11/24/21 0907  BNP 113.8*    ProBNP (last 3 results) No results for input(s): PROBNP in the last 8760 hours.   DDimer No results for input(s): DDIMER in the last 168 hours.   Hemoglobin A1c:  Lab Results  Component Value Date   HGBA1C 7.1 (H) 11/24/2021   MPG 157.07 11/24/2021    TSH  Recent Labs    11/24/21 0906  TSH 0.871    Lipid Panel     Component Value Date/Time   CHOL 91 06/04/2021 0808   CHOL 167 04/02/2020 0000   TRIG 59.0 06/04/2021 0808   TRIG 86 04/02/2020 0000   HDL 37.70 (L) 06/04/2021 0808   CHOLHDL 2 06/04/2021 0808   VLDL 11.8 06/04/2021 0808   LDLCALC 42 06/04/2021 0808   LDLCALC 107 04/02/2020 0000    Imaging: DG Chest Port 1 View  Result Date: 11/26/2021 CLINICAL DATA:  Status post coronary bypass surgery, difficulty breathing EXAM: PORTABLE CHEST 1 VIEW COMPARISON:  Previous studies including the examination of 11/25/2021 FINDINGS: Transverse diameter of heart is increased. Central pulmonary vessels are more prominent. Increased interstitial markings are seen in the parahilar regions and lower lung fields. There is interval removal of endotracheal tube and enteric tube. Tip of Swan-Ganz catheter is noted at the level of main pulmonary artery. Left chest tube is noted. Mediastinal drain is noted. There is blunting of left lateral CP angle. There is no pneumothorax. IMPRESSION: Central pulmonary vessels are more prominent suggesting CHF. Increased interstitial and alveolar densities seen in the parahilar regions and lower lung fields, more so on the left side suggesting possible asymmetric pulmonary edema. Small left pleural effusion. Electronically Signed   By: Prudy Feeler.D.  On: 11/26/2021 08:21   DG Chest Port 1 View  Result Date:  11/25/2021 CLINICAL DATA:  Pneumothorax. EXAM: PORTABLE CHEST 1 VIEW COMPARISON:  Chest radiographs 11/23/2021 and CTA 11/24/2021 FINDINGS: Sequelae of interval CABG are identified. An endotracheal tube terminates at the clavicular heads, well above the carina. A right jugular Swan-Ganz catheter terminates over the main pulmonary artery. An enteric tube terminates over the gastric fundus. A mediastinal drain and left chest tube are present. The cardiac silhouette is borderline enlarged. Lung volumes are decreased with hazy opacity in the left mid and lower lung. No large pleural effusion or pneumothorax is identified. IMPRESSION: 1. Interval CABG with support devices as above. 2. Low lung volumes with left mid and lower lung opacities, likely atelectasis. Electronically Signed   By: Logan Bores M.D.   On: 11/25/2021 14:20   VAS US DOPPLER PRE CABG  Result Date: 11/25/2021 PREOPERATIVE VASCULAR EVALUATION Patient Name:  JABORI HENEGAR Adrian  Date of Exam:   11/24/2021 Medical Rec #: [id number]        Accession #:    [id number] Date of Birth: 30-Nov-1955         Patient Gender: M Patient Age:   34 years Exam Location:  [facility] Procedure:      VAS US DOPPLER PRE CABG Referring Phys: Remo Lipps HENDRICKSON --------------------------------------------------------------------------------  Indications:  Pre-CABG. Risk Factors: Hypertension, hyperlipidemia, Diabetes, current smoker, prior MI. Performing Technologist: Darlin Coco RDMS, RVT  Examination Guidelines: A complete evaluation includes B-mode imaging, spectral Doppler, color Doppler, and power Doppler as needed of all accessible portions of each vessel. Bilateral testing is considered an integral part of a complete examination. Limited examinations for reoccurring indications may be performed as noted.  Right Carotid Findings: +----------+--------+--------+--------+------------------------------+--------+             PSV cm/s EDV cm/s Stenosis Describe                        Comments  +----------+--------+--------+--------+------------------------------+--------+  CCA Prox   70       14                                                         +----------+--------+--------+--------+------------------------------+--------+  CCA Distal 70       16                smooth, focal and heterogenous           +----------+--------+--------+--------+------------------------------+--------+  ICA Prox   39       13       1-39%    smooth, focal and heterogenous           +----------+--------+--------+--------+------------------------------+--------+  ICA Distal 58       16                                                         +----------+--------+--------+--------+------------------------------+--------+  ECA        123      12                                                         +----------+--------+--------+--------+------------------------------+--------+ +----------+--------+-------+----------------+------------+  PSV cm/s EDV cms Describe         Arm Pressure  +----------+--------+-------+----------------+------------+  Subclavian 87               Multiphasic, WNL               +----------+--------+-------+----------------+------------+ +---------+--------+--+--------+-+---------+  Vertebral PSV cm/s 35 EDV cm/s 8 Antegrade  +---------+--------+--+--------+-+---------+ Left Carotid Findings: +----------+--------+--------+--------+------------+------------------+             PSV cm/s EDV cm/s Stenosis Describe     Comments            +----------+--------+--------+--------+------------+------------------+  CCA Prox   82       15                                                 +----------+--------+--------+--------+------------+------------------+  CCA Distal 57       17                             intimal thickening  +----------+--------+--------+--------+------------+------------------+  ICA Prox   40       15       1-39%    heterogenous                      +----------+--------+--------+--------+------------+------------------+  ICA Distal 76       30                                                 +----------+--------+--------+--------+------------+------------------+  ECA        160      12                                                 +----------+--------+--------+--------+------------+------------------+ +----------+--------+--------+--------+------------+  Subclavian PSV cm/s EDV cm/s Describe Arm Pressure  +----------+--------+--------+--------+------------+             237               Stenotic               +----------+--------+--------+--------+------------+ +---------+--------+--+--------+--+---------+  Vertebral PSV cm/s 32 EDV cm/s 11 Antegrade  +---------+--------+--+--------+--+---------+  ABI Findings: +--------+------------------+-----+---------+--------+  Right    Rt Pressure (mmHg) Index Waveform  Comment   +--------+------------------+-----+---------+--------+  Brachial                          triphasic           +--------+------------------+-----+---------+--------+  PTA                               triphasic           +--------+------------------+-----+---------+--------+  DP                                triphasic           +--------+------------------+-----+---------+--------+ +--------+------------------+-----+----------+---------------------------------+  Left     Lt  Pressure (mmHg) Index Waveform   Comment                            +--------+------------------+-----+----------+---------------------------------+  Brachial                          triphasic                                     +--------+------------------+-----+----------+---------------------------------+  PTA                               monophasic Area of stenosis at distal PTA w/                                                velocities of 323                  +--------+------------------+-----+----------+---------------------------------+  DP                                 triphasic                                     +--------+------------------+-----+----------+---------------------------------+  Right Doppler Findings: +--------+--------+-----+---------+--------+  Site     Pressure Index Doppler   Comments  +--------+--------+-----+---------+--------+  Brachial                triphasic           +--------+--------+-----+---------+--------+  Radial                  triphasic           +--------+--------+-----+---------+--------+  Ulnar                   triphasic           +--------+--------+-----+---------+--------+  Left Doppler Findings: +--------+--------+-----+---------+--------+  Site     Pressure Index Doppler   Comments  +--------+--------+-----+---------+--------+  Brachial                triphasic           +--------+--------+-----+---------+--------+  Radial                  triphasic           +--------+--------+-----+---------+--------+  Ulnar                   triphasic           +--------+--------+-----+---------+--------+  Summary: Right Carotid: Velocities in the right ICA are consistent with a 1-39% stenosis. Left Carotid: Velocities in the left ICA are consistent with a 1-39% stenosis. Vertebrals:  Bilateral vertebral arteries demonstrate antegrade flow. Subclavians: Left subclavian artery was stenotic. Normal flow hemodynamics were              seen in the right subclavian artery. Right ABI: Triphasic waveforms observed in PTA and DPA. Left ABI: Area of stenosis observed at distal PTA with velocities of 323 cm/s.  Right Upper Extremity: Doppler waveforms remain within normal limits  with right radial compression. Doppler waveform obliterate with right ulnar compression. Left Upper Extremity: Doppler waveform obliterate with left radial compression. Doppler waveforms remain within normal limits with left ulnar compression.  Electronically signed by Deitra Mayo MD on 11/25/2021 at 6:30:33 AM.    Final     CARDIAC DATABASE: EKG: 11/23/2020: NSR, 78  bpm, ST depressions inferior and lateral leads suggestive of inferolateral ischemia, without underlying injury pattern.   11/24/2021: NSR, 70 bpm, old anteroseptal infarct, ST depressions in inferior leads suggestive of inferior ischemia, without underlying injury pattern.  Echocardiogram: 11/24/2021:  1. Left ventricular ejection fraction, by estimation, is 50 to 55%. The  left ventricle has low normal function. The left ventricle has no regional wall motion abnormalities. There is mild left ventricular hypertrophy.  Left ventricular diastolic parameters are consistent with Grade II diastolic dysfunction (pseudonormalization). Elevated left atrial pressure.   2. Right ventricular systolic function is normal. The right ventricular size is normal.   3. Left atrial size was mildly dilated.   4. The mitral valve is degenerative. Mild mitral valve regurgitation. No evidence of mitral stenosis.   5. The aortic valve is tricuspid. Aortic valve regurgitation is not visualized. Aortic valve sclerosis/calcification is present, without any evidence of aortic stenosis.   Stress test: None  Heart catheterization: November 24, 2021: LM: Normal LAD: Complex mid LAD/Diag2/septal 90% trifurcation lesion ([place] 1,1,1) Lcx: Prox OM2 80% stenosis RCA: Prox CTO. Epicardial right-to-right and left-to-right collaterals, mostly from LAD septals   LVEDP normal LVEF by LV gram 50-55%.    Continue Aspirin, heparin, statin, blood pressure control CVTS consulted for CABG  Carotid duplex: 11/24/2021: Right Carotid: Velocities in the right ICA are consistent with a 1-39% stenosis.  Left Carotid: Velocities in the left ICA are consistent with a 1-39% stenosis.  Vertebrals:  Bilateral vertebral arteries demonstrate antegrade flow.  Subclavians: Left subclavian artery was stenotic. Normal flow hemodynamics were seen in the right subclavian artery.   Ankle-brachial index: 11/24/2021: Right ABI: Triphasic waveforms  observed in PTA and DPA.  Left ABI: Area of stenosis observed at distal PTA with velocities of 323 cm/s.  Upper extremity arterial duplex: 11/24/2021: Right Upper Extremity: Doppler waveforms remain within normal limits with right radial compression. Doppler waveform obliterate with right ulnar compression.  Left Upper Extremity: Doppler waveform obliterate with left radial compression. Doppler waveforms remain within normal limits with left ulnar compression.  Scheduled Meds:  acetaminophen  1,000 mg Oral Q6H   Or   acetaminophen (TYLENOL) oral liquid 160 mg/5 mL  1,000 mg Per Tube Q6H   aspirin EC  81 mg Oral Daily   Or   aspirin  81 mg Per Tube Daily   bisacodyl  10 mg Oral Daily   Or   bisacodyl  10 mg Rectal Daily   Chlorhexidine Gluconate Cloth  6 each Topical Daily   docusate sodium  200 mg Oral Daily   enoxaparin (LOVENOX) injection  40 mg Subcutaneous QHS   insulin aspart  0-24 Units Subcutaneous Q4H   insulin detemir  15 Units Subcutaneous BID   lisinopril  5 mg Oral Daily   metoprolol tartrate  25 mg Oral BID   Or   metoprolol tartrate  25 mg Per Tube BID   multivitamin with minerals  1 tablet Oral Daily   [START ON 11/27/2021] pantoprazole  40 mg Oral Daily   sodium chloride flush  3 mL Intravenous Q12H    Continuous Infusions:  sodium chloride  sodium chloride     sodium chloride 10 mL/hr at 11/25/21 1330    ceFAZolin (ANCEF) IV Stopped (11/26/21 1436)   dexmedetomidine (PRECEDEX) IV infusion 0 mcg/kg/hr (11/25/21 1659)   lactated ringers Stopped (11/26/21 0603)   lactated ringers Stopped (11/26/21 2947)   nitroGLYCERIN Stopped (11/26/21 1150)   phenylephrine (NEO-SYNEPHRINE) Adult infusion Stopped (11/25/21 1511)    PRN Meds: sodium chloride, dextrose, metoprolol tartrate, midazolam, morphine injection, ondansetron (ZOFRAN) IV, oxyCODONE, sodium chloride flush, traMADol   IMPRESSION & RECOMMENDATIONS: Stephen Shelton is a 51 y.o. 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  Postop day 1 four-vessel CABG (LIMA to the LAD, SVG to diagonal, SVG to OM, SVG to PDA).  Presented as non-STEMI, difficult cardiac discomfort, high sensitive troponins peaked at 12,338. Left heart catheterization noted multivessel CAD. In the setting of underlying diabetes and multivessel CAD CT surgery was consulted and patient underwent surgical revascularization. Progressing well. Extubated postop day 0 Plans on removal of Swan-Ganz catheter, art line & chest tubes later today. Pacer wires in place.  Continue telemetry to monitor for dysrhythmias/A. Fib.  Encouraged bedside spirometry as tolerated with a goal 10 times an hour while awake -to help prevent atelectasis/pneumonia. Ambulate per CVICU protocol.  Agree with increasing Lopressor to 25 mg p.o. twice daily for better heart rate control. Recommend aspirin and Plavix when medically safe given his non-STEMI.  Continue high intensity statin therapy.  Postop day 1 right lower extremity vein harvesting: Incision site clean dry and intact.  No evidence of cellulitis.  Continue to monitor.  NSTEMI Status post surgical revascularization due to multivessel CAD and underlying diabetes. Recommend dual antiplatelet therapy when medically safe. Continue Lopressor for now we will transition to Toprol-XL prior to discharge Will uptitrate GDMT in a stepwise fashion as hemodynamics and laboratory values allow. Agree with IV Lasix for volume management.  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 Last hemoglobin A1c 6.9 as of 10/26/2021   HTN: Restart home blood pressure  medications.  Monitor BPs.   Mixed HLD.   Recommend atorvastatin to 80 mg p.o. nightly.   Cigarette smoking: Currently smokes 1 pack/day for the last 30 years at least. Educated on importance of complete smoking cessation.  Pre-CABG lab work noted questionably abnormal urine analysis.  Requested his nurse to see if it can be sent to culture as he does have a history of UTIs in the past.  Patient denies dysuria, no gross hematuria, flank pain.  May needs outpatient follow-up.  Patient's questions and concerns were addressed to his satisfaction. He voices understanding of the instructions provided during this encounter.   This note was created using a voice recognition software as a result there may be grammatical errors inadvertently enclosed that do not reflect the nature of this encounter. Every attempt is made to correct such errors.  Mechele Claude [facility]  Pager: [phone] Office: [phone] 11/26/2021, 5:54 PM

## 2021-11-26 NOTE — Op Note (Signed)
NAME: Stephen Shelton, Stephen Shelton RECORD NO: [id number] ACCOUNT NO: [id number] DATE OF BIRTH: [birth year] FACILITY: [facility] LOCATION: [facility] PHYSICIAN: [name]. Roxan Hockey, MD  Operative Report   DATE OF PROCEDURE: 11/25/2021  DATE OF PROCEDURE:  11/25/2021.  PREOPERATIVE DIAGNOSIS:  Three-vessel coronary artery disease, status post non-ST elevation myocardial infarction.  POSTOPERATIVE DIAGNOSIS:  Three-vessel coronary artery disease, status post ST elevation myocardial infarction.  PROCEDURE PERFORMED:   Median sternotomy, extracorporeal circulation,  Coronary artery bypass grafting x 4  Left internal mammary artery to LAD,  Saphenous vein graft to first diagonal,  Saphenous vein graft to the obtuse marginal 1, Saphenous vein graft to posterior descending, Endoscopic vein harvest right leg.  SURGEON: Modesto Charon, MD.  ASSISTANT: Lars Pinks, PA.  ANESTHESIA: General.  FINDINGS: Transesophageal echocardiography showed ejection fraction of approximately 50%.  No significant valvular pathology.  No regional wall motion abnormalities.  CLINICAL NOTE:  Stephen Shelton is a 66 year old gentleman who presented with back and chest pain.  He ruled in for a non-ST elevation MI.  At catheterization, he was found to have 3-vessel coronary artery disease.  He was referred for coronary artery bypass  grafting.  The indications, risks, benefits, and alternatives were discussed in detail with the patient.  He understood and accepted the risks and agreed to proceed.  OPERATIVE NOTE:  Stephen Shelton was brought to the preoperative holding area on 11/25/2021. [name] placed a Swan-Ganz catheter and arterial blood pressure monitoring line.  He was taken to the operating room and anesthetized and intubated.  A Foley catheter was placed.  Intravenous antibiotics were administered and Dr. [name] performed transesophageal echocardiography. Findings as noted above.  The chest, abdomen  and  legs were prepped and draped in the usual sterile fashion.  Timeout was performed.  An incision was made in the medial aspect of the right leg at the level of the knee.  The greater saphenous vein was harvested from the upper calf to the groin endoscopically, which was performed by Lars Pinks while I  harvested the left mammary artery. The saphenous vein was of good quality.  Simultaneously, a median sternotomy was performed and the left internal mammary artery was harvested using standard technique.  The mammary artery was a good vessel with  excellent flow when divided distally.  2000 units of heparin was administered during the vessel harvest.  The remainder of the full heparin dose was given prior to opening the pericardium.  Sternal retractor was placed and opened.  The pericardium was opened.  The ascending aorta was inspected.  It was of normal caliber with no significant atherosclerotic disease.  After confirming adequate anticoagulation with ACT measurement, the aorta  was cannulated via concentric 2-0 Ethibond pledgeted pursestring sutures.  A dual stage venous cannula was placed via a pursestring suture in the right atrial appendage.  Cardiopulmonary bypass was initiated.  Flows were maintained per protocol.  The  patient was cooled to 32 degrees Celsius.  The coronary arteries were inspected and anastomotic sites were chosen.  The conduits were inspected and cut to length.  A foam pad was placed in the pericardium to insulate the heart.  A temperature probe was  placed in the myocardial septum and a cardioplegia cannula was placed in the ascending aorta.  The aorta was cross clamped.  The left ventricle was emptied via the aortic root vent. Cardiac arrest then was achieved with a combination of cold antegrade blood cardioplegia and topical iced saline. 1.5 liters of cardioplegia was  administered.  There  was rapid diastolic arrest and there was septal cooling to 12-degree  Celsius.  A reversed saphenous vein graft was placed end to side to the posterior descending branch of the right coronary.  This was a good quality vessel at the site of the anastomosis.  There was some plaque distally, but a 1.5 mm probe did pass beyond that.  The  vein was of good quality.  It was anastomosed end-to-side with a running 7-0 Prolene suture.  All anastomoses were probed proximally and distally at the completion and cardioplegia was administered down each vein graft at its completion.  A reversed saphenous vein graft then was placed end-to-side to the obtuse marginal 1.  This was a high anterolateral branch.  It was a 1.5 mm good quality vessel.  It was superficially intramyocardial.  The vein was anastomosed end-to-side with a running  7-0 Prolene suture.  Again, a probe passed easily proximally and distally and there was good flow and good hemostasis with cardioplegia administration.  A reversed saphenous vein graft then was placed end-to-side to the first diagonal branch of the LAD.  This was a 1.5 mm good quality target at the site of the anastomosis. It was grafted relatively distally as there was plaque more proximally in the vessel.   The vein was good quality.  It was anastomosed end-to-side with a running 7-0 Prolene suture. The probe was passed proximally and distally and cardioplegia was administered with good flow and good hemostasis.  Additional cardioplegia was administered down the aortic root.  The left internal mammary artery was brought through a window in the pericardium.  The distal end was bevelled and it was anastomosed end-to-side to the distal LAD.  The LAD was 2 mm, good  quality target.  The mammary was a 1.5 mm good quality conduit.  The end-to-side anastomosis was performed with a running 8-0 Prolene suture.  At the completion of the anastomosis, the bulldog clamp was removed.  Rapid septal rewarming was noted.  The  bulldog clamp was replaced.  The mammary  pedicle was tacked to the epicardial surface of the harvest with 6-0 Prolene sutures.  Additional cardioplegia was administered.  The vein grafts were cut to length.  The cardioplegia cannula was removed from the ascending aorta.  The proximal vein graft anastomoses were performed to 4.5 mm punch aortotomies with running 6-0 Prolene  sutures.  At the completion of the final proximal anastomosis, the patient was placed in Trendelenburg position.  Lidocaine was administered.  The aortic root was de-aired and the aortic crossclamp was removed.  The total crossclamp time was 76 minutes.   The patient initially fibrillated and required a single defibrillation with 10 joules and was in sinus rhythm thereafter.  While rewarming was completed.  All proximal and distal anastomoses were inspected for hemostasis.  Epicardial pacing wires were placed on the right ventricle and right atrium.  When the patient had rewarmed to a core temperature of 37 degrees Celsius,  he was weaned from cardiopulmonary bypass on the first attempt.  He was DDD paced for rate.  He did not require inotropic support.  The initial cardiac index was 1.5 liters per minute per meter squared, but rapidly improved with volume administration and  then was greater than 2 liters per minute per meter squared. Post-bypass transesophageal echocardiography was unchanged from the prebypass study.  The total bypass time was 107 minutes.  A test dose protamine was administered and was well tolerated.  The atrial and aortic cannula were removed.  The remainder of the protamine was administered without incident.  The chest was irrigated with warm saline.  Hemostasis was achieved.  Left  pleural and mediastinal chest tubes were placed through separate subcostal incisions.  The pericardium was reapproximated with interrupted 3-0 silk sutures.  It came together easily without tension.  The sternum was closed with a combination of single  double heavy gauge  stainless steel wires.  There was a transient drop in cardiac index after closure of the chest, which improved with volume administration. There were no other hemodynamic changes.  The pectoralis fascia, subcutaneous tissue and skin  were closed in standard fashion.  All sponge, needle and instrument counts were correct at the end of the procedure.    Experienced assistance was necessary for this case due to its complexity.  Lars Pinks independently harvested the saphenous vein  and then provided exposure, retraction, suture management, and suctioning during the coronary anastomoses.   MUK D: 11/25/2021 6:13:26 pm T: 11/26/2021 12:11:00 am  JOB: [id number]/ [id number]

## 2021-11-26 NOTE — Progress Notes (Addendum)
[facility] DAILY ICU PROGRESS NOTE                   [address]            [place] [address]          [phone]   1 Day Post-Op Procedure(s) (LRB): CORONARY ARTERY BYPASS GRAFTING (CABG) X 4  ON PUMP USING LEFT INTERNAL MAMMARY ARTERY AND RIGHT ENDOSCOPIC GREATER SAPHENOUS VEIN CONDUITS (Right) ENDOVEIN HARVEST OF GREATER SAPHENOUS VEIN (Right) APPLICATION OF CELL SAVER  Total Length of Stay:  LOS: 2 days   Subjective: Patient awake and alert, sitting in chair.  Objective: Vital signs in last 24 hours: Temp:  [97.3 F (36.3 C)-98.4 F (36.9 C)] 98.4 F (36.9 C) (01/05 0600) Pulse Rate:  [67-93] 93 (01/05 0600) Cardiac Rhythm: Normal sinus rhythm (01/05 0400) Resp:  [10-27] 24 (01/05 0600) BP: (96-189)/(57-92) 96/57 (01/05 0600) SpO2:  [93 %-100 %] 93 % (01/05 0600) Arterial Line BP: (101-169)/(54-72) 153/56 (01/05 0600) FiO2 (%):  [40 %-50 %] 40 % (01/04 1733) Weight:  [105.6 kg] 105.6 kg (01/05 0500)  Filed Weights   11/24/21 1129 11/24/21 1130 11/26/21 0500  Weight: 98 kg 98.2 kg 105.6 kg    Weight change: 7.623 kg   Hemodynamic parameters for last 24 hours: PAP: (15-35)/(2-19) 19/10 CVP:  [1 mmHg-4 mmHg] 3 mmHg CO:  [4.1 L/min-6.4 L/min] 6.4 L/min CI:  [1.9 L/min/m2-2.9 L/min/m2] 2.9 L/min/m2  Intake/Output from previous day: 01/04 0701 - 01/05 0700 In: 3917.7 [I.V.:2560.8; Blood:202; IV TZGYFVCBS:4967] Out: 5916 [BWGYK:5993; Blood:275; Chest Tube:340]  Intake/Output this shift: Total I/O In: 1278.5 [I.V.:646.9; IV Piggyback:631.6] Out: 868 [Urine:608; Chest Tube:260]  Current Meds: Scheduled Meds:  acetaminophen  1,000 mg Oral Q6H   Or   acetaminophen (TYLENOL) oral liquid 160 mg/5 mL  1,000 mg Per Tube Q6H   aspirin EC  325 mg Oral Daily   Or   aspirin  324 mg Per Tube Daily   aspirin EC  81 mg Oral Daily   bisacodyl  10 mg Oral Daily   Or   bisacodyl  10 mg Rectal Daily   Chlorhexidine Gluconate Cloth  6 each Topical Daily    docusate sodium  200 mg Oral Daily   metoprolol tartrate  12.5 mg Oral BID   Or   metoprolol tartrate  12.5 mg Per Tube BID   multivitamin with minerals  1 tablet Oral Daily   [START ON 11/27/2021] pantoprazole  40 mg Oral Daily   sodium chloride flush  3 mL Intravenous Q12H   Continuous Infusions:  sodium chloride     sodium chloride     sodium chloride 10 mL/hr at 11/25/21 1330    ceFAZolin (ANCEF) IV Stopped (11/26/21 0541)   dexmedetomidine (PRECEDEX) IV infusion 0 mcg/kg/hr (11/25/21 1659)   insulin 1 Units/hr (11/26/21 0600)   lactated ringers 10 mL/hr at 11/26/21 0600   lactated ringers 20 mL/hr at 11/26/21 0600   nitroGLYCERIN 50 mcg/min (11/26/21 0600)   phenylephrine (NEO-SYNEPHRINE) Adult infusion Stopped (11/25/21 1511)   potassium chloride 10 mEq (11/26/21 0603)   PRN Meds:.sodium chloride, dextrose, metoprolol tartrate, midazolam, morphine injection, ondansetron (ZOFRAN) IV, oxyCODONE, sodium chloride flush, traMADol  General appearance: alert, cooperative, and no distress Neurologic: intact Heart: RRR Lungs: Diminished bibasilar breath sounds Abdomen: Soft, non tender, sporadic bowel sounds Extremities: Mild LE edema Wound: Sternal dressing is clean and dry. RLE wounds are clean and dry  Lab Results: CBC: Recent Labs  11/25/21 1849 11/25/21 1856 11/26/21 0300  WBC 13.6*  --  13.6*  HGB 12.1* 12.2* 11.4*  HCT 37.1* 36.0* 33.7*  PLT 137*  --  139*   BMET:  Recent Labs    11/25/21 1849 11/25/21 1856 11/26/21 0300  NA 136 139 131*  K 4.4 4.4 3.8  CL 110  --  106  CO2 22  --  21*  GLUCOSE 115*  --  113*  BUN 12  --  12  CREATININE 0.59*  --  0.59*  CALCIUM 8.0*  --  8.1*    CMET: Lab Results  Component Value Date   WBC 13.6 (H) 11/26/2021   HGB 11.4 (L) 11/26/2021   HCT 33.7 (L) 11/26/2021   PLT 139 (L) 11/26/2021   GLUCOSE 113 (H) 11/26/2021   CHOL 91 06/04/2021   TRIG 59.0 06/04/2021   HDL 37.70 (L) 06/04/2021   LDLCALC 42 06/04/2021    ALT 21 11/23/2021   AST 29 11/23/2021   NA 131 (L) 11/26/2021   K 3.8 11/26/2021   CL 106 11/26/2021   CREATININE 0.59 (L) 11/26/2021   BUN 12 11/26/2021   CO2 21 (L) 11/26/2021   TSH 0.871 11/24/2021   PSA 0.38 06/02/2012   INR 1.2 11/25/2021   HGBA1C 7.1 (H) 11/24/2021   MICROALBUR 3.6 (H) 06/04/2021      PT/INR:  Recent Labs    11/25/21 1340  LABPROT 15.2  INR 1.2   Radiology: [facility] Chest Port 1 View  Result Date: 11/25/2021 CLINICAL DATA:  Pneumothorax. EXAM: PORTABLE CHEST 1 VIEW COMPARISON:  Chest radiographs 11/23/2021 and CTA 11/24/2021 FINDINGS: Sequelae of interval CABG are identified. An endotracheal tube terminates at the clavicular heads, well above the carina. A right jugular Swan-Ganz catheter terminates over the main pulmonary artery. An enteric tube terminates over the gastric fundus. A mediastinal drain and left chest tube are present. The cardiac silhouette is borderline enlarged. Lung volumes are decreased with hazy opacity in the left mid and lower lung. No large pleural effusion or pneumothorax is identified. IMPRESSION: 1. Interval CABG with support devices as above. 2. Low lung volumes with left mid and lower lung opacities, likely atelectasis. Electronically Signed   By: Logan Bores M.D.   On: 11/25/2021 14:20     Assessment/Plan: S/P Procedure(s) (LRB): CORONARY ARTERY BYPASS GRAFTING (CABG) X 4  ON PUMP USING LEFT INTERNAL MAMMARY ARTERY AND RIGHT ENDOSCOPIC GREATER SAPHENOUS VEIN CONDUITS (Right) ENDOVEIN HARVEST OF GREATER SAPHENOUS VEIN (Right) APPLICATION OF CELL SAVER CV-CO/CI 6.4/2.9. S/p NSTEMI. On Nitro drip. Lopressor 12.5 mg bid ordered but will increase to 25 mg bid for better BP control. Will decrease ecasa to 81 mg as will likely start Plavix soon for ACS. Pulmonary-on 2 liters via oxygen. Wean as able over next few days. Chest tubes with 340 cc since surgery. CXR this am appears to show bibasilar atelectasis. Encourage incentive  spirometer. Volume overload-give Lasix this am Expected post op blood loss anemia-H and H this am slightly decreased to 11.4 and 33.7 DM-CBGs 114/127/129. Stop Insulin drip. Pre op HGA1C 7.1. Per patient, he is diet controlled. 6. Hyponatremia-sodium this am 131 7. Supplement potassium 8. Mild thrombocytopenia-platelets this am 139,000 9. Please see progression orders (remove a line, Swan, likely chest tubes) 10. Lovenox for DVT prophylaxis  Donielle Liston Alba PA-C 11/26/2021 6:59 AM   Patient seen and examined, agree with above Looks great Excellent cardiac output Nonspecific ST changes on ECG- likely pericarditis IS for atelectasis Dc chest  tube, Swan and A line Diurese Mobilize  Remo Lipps C. Roxan Hockey, MD [facility] [phone]

## 2021-11-26 NOTE — Discharge Summary (Signed)
Physician Discharge Summary       [address]       [place] [address]             [phone]    Patient ID: Stephen Shelton MRN: [id number] DOB/AGE: [birth year] 66 y.o.  Admit date: 11/23/2021 Discharge date: 11/29/2021  Admission Diagnoses: NSTEMI (non-ST elevated myocardial infarction) ([place]) 2. Coronary artery disease  Discharge Diagnoses:  S/P CABG x 4 Expected post op blood loss anemia History of Benign hypertension History of diabetes mellitus History of hyperlipidemia History of tobacco abuse History of septic arthritis (HCC)      Consults: None  Procedure (s):  Median sternotomy, extracorporeal circulation, coronary artery bypass grafting x 4 (left internal mammary artery to LAD, saphenous vein graft to first diagonal, saphenous vein graft to the obtuse marginal 1, saphenous vein graft to  posterior descending), endoscopic vein harvest, right leg by Dr. Roxan Hockey on 11/25/2021.  HPI: This is a 66 year old male admitted following an NSTEMI.  He underwent a left heart cath which showed three-vessel coronary artery disease.  The patient states that he originally had sharp back pain while sitting at a desk earlier today.  This lasted for several hours once EMS was called this was relieved with sublingual nitroglycerin.  He admits to a several week history of anginal symptoms.  He has been able to keep up with this activity however.  He played golf 2 times last week without any symptoms. Dr. Kipp Brood discussed the need for coronary artery bypass grafting surgery. Potential risks, benefits, and complications of the surgery were discussed with the patient and he agreed to proceed with surgery. Pre operative carotid duplex US shows no significant internal carotid artery stenoses bilaterally.  Hospital Course: Patient underwent a CABG x 4. He was transferred from the OR to [facility] ICU in stable condition. He was extubated successfully early the evening of surgery. He  remained afebrile and hemodynamically stable. He was weaned off Nitro drip. Gordy Councilman, a line, chest tubes, and foley were all removed early in his post operative course. He was started on Lopressor. He was volume overloaded and diuresed accordingly. He was transitioned off the Insulin drip. Of note, his pre op HGA1C is 7.1. He is diet controlled.  Epicardial pacing wires were removed prior to starting Plavix. He had mild thrombocytopenia post op as well as expected post op blood loss anemia. He did not require a post op transfusion. His last H and H was 10.3 and 31.3.  Patient was felt surgically stable for transfer from the ICU to 4E for further convalescence on 11/27/2021. Epicardial pacing wires were removed on 01/07. He was started on Plavix and continue on baby ec asa 81 mg daily for NSTEMI. Lopressor was increased to 37.5 mg bid for better HR control and he was restarted on low dose Lisinopril. He has been ambulating on room air with good oxygenation. He has been tolerating a diet and has had a bowel movement. All wounds are clean, dry, healing without signs of infection. He is felt surgically stable for discharge today.   Latest Vital Signs: Blood pressure 134/84, pulse 73, temperature 98.1 F (36.7 C), temperature source Oral, resp. rate 20, height 5\' 11"  (1.803 m), weight 103.1 kg, SpO2 97 %.  Physical Exam: Cardiovascular: RRR Pulmonary: Slightly diminished bibasilar breath sounds Abdomen: Soft, non tender, bowel sounds present. Extremities: Mild bilateral lower extremity edema. Wounds: Clean and dry.  No erythema or signs of infection.  Discharge Condition:Stable and discharged to home.  Recent laboratory studies:  Lab Results  Component Value Date   WBC 10.8 (H) 11/28/2021   HGB 10.3 (L) 11/28/2021   HCT 31.1 (L) 11/28/2021   MCV 86.4 11/28/2021   PLT 134 (L) 11/28/2021   Lab Results  Component Value Date   NA 132 (L) 11/28/2021   K 3.7 11/28/2021   CL 102 11/28/2021    CO2 26 11/28/2021   CREATININE 0.62 11/28/2021   GLUCOSE 108 (H) 11/28/2021      Diagnostic Studies: DG Chest 2 View  Result Date: 11/28/2021 CLINICAL DATA:  Status post CABG. EXAM: CHEST - 2 VIEW COMPARISON:  11/27/2021 FINDINGS: 0533 hours. Low lung volumes. The cardio pericardial silhouette is enlarged. There is left base collapse/consolidation with left pleural effusion. Interval decrease in vascular congestion. Bones are diffusely demineralized. Telemetry leads overlie the chest. IMPRESSION: Low lung volumes with left base collapse/consolidation and left pleural effusion. Interval decrease in vascular congestion. Electronically Signed   By: Misty Stanley M.D.   On: 11/28/2021 08:07   DG Chest 2 View  Result Date: 11/23/2021 CLINICAL DATA:  Chest pain. EXAM: CHEST - 2 VIEW COMPARISON:  July 23, 2021 FINDINGS: Mild, chronic appearing increased lung markings are seen without evidence of acute infiltrate, pleural effusion or pneumothorax. The heart size and mediastinal contours are within normal limits. Multilevel degenerative changes are seen throughout the thoracic spine. IMPRESSION: No active cardiopulmonary disease. Electronically Signed   By: Virgina Norfolk M.D.   On: 11/23/2021 23:46   CARDIAC CATHETERIZATION  Result Date: 11/24/2021 Images from the original result were not included. LM: Normal LAD: Complex mid LAD/Diag2/septal 90% trifurcation lesion ([place] 1,1,1) Lcx: Prox OM2 80% stenosis RCA: Prox CTO. Epicardial right-to-right and left-to-right collaterals, mostly from LAD septals LVEDP normal LVEF by LV gram 50-55%. Continue Aspirin, heparin, statin, blood pressure control CVTS consulted for CABG Nigel Mormon, MD Pager: [phone] Office: [phone]  DG Chest Port 1 View  Result Date: 11/27/2021 CLINICAL DATA:  Status post cardiac surgery, difficulty breathing EXAM: PORTABLE CHEST 1 VIEW COMPARISON:  Previous studies including the examination of 11/26/2021. FINDINGS:  Transverse diameter of heart is increased. There is interval decrease in pulmonary vascular congestion. There is interval removal of mediastinal drain and left chest tube. There is interval removal of Swan-Ganz catheter. Right IJ vascular sheath is still noted in place. There is blunting of lateral CP angles. There is no pneumothorax. IMPRESSION: There is interval decrease in pulmonary vascular congestion and pulmonary edema. There are no new focal infiltrates. Small bilateral pleural effusions. Electronically Signed   By: Elmer Picker M.D.   On: 11/27/2021 08:24   DG Chest Port 1 View  Result Date: 11/26/2021 CLINICAL DATA:  Status post coronary bypass surgery, difficulty breathing EXAM: PORTABLE CHEST 1 VIEW COMPARISON:  Previous studies including the examination of 11/25/2021 FINDINGS: Transverse diameter of heart is increased. Central pulmonary vessels are more prominent. Increased interstitial markings are seen in the parahilar regions and lower lung fields. There is interval removal of endotracheal tube and enteric tube. Tip of Swan-Ganz catheter is noted at the level of main pulmonary artery. Left chest tube is noted. Mediastinal drain is noted. There is blunting of left lateral CP angle. There is no pneumothorax. IMPRESSION: Central pulmonary vessels are more prominent suggesting CHF. Increased interstitial and alveolar densities seen in the parahilar regions and lower lung fields, more so on the left side suggesting possible asymmetric pulmonary edema. Small left pleural effusion. Electronically  Signed   By: Elmer Picker M.D.   On: 11/26/2021 08:21   DG Chest Port 1 View  Result Date: 11/25/2021 CLINICAL DATA:  Pneumothorax. EXAM: PORTABLE CHEST 1 VIEW COMPARISON:  Chest radiographs 11/23/2021 and CTA 11/24/2021 FINDINGS: Sequelae of interval CABG are identified. An endotracheal tube terminates at the clavicular heads, well above the carina. A right jugular Swan-Ganz catheter terminates  over the main pulmonary artery. An enteric tube terminates over the gastric fundus. A mediastinal drain and left chest tube are present. The cardiac silhouette is borderline enlarged. Lung volumes are decreased with hazy opacity in the left mid and lower lung. No large pleural effusion or pneumothorax is identified. IMPRESSION: 1. Interval CABG with support devices as above. 2. Low lung volumes with left mid and lower lung opacities, likely atelectasis. Electronically Signed   By: Logan Bores M.D.   On: 11/25/2021 14:20   ECHOCARDIOGRAM COMPLETE  Result Date: 11/24/2021    ECHOCARDIOGRAM REPORT   Patient Name:   Stephen Shelton Staller Date of Exam: 11/24/2021 Medical Rec #:  [id number]       Height:       71.0 in Accession #:    [id number]      Weight:       216.4 lb Date of Birth:  March 01, 1956        BSA:          2.180 m Patient Age:    17 years        BP:           140/80 mmHg Patient Gender: M               HR:           67 bpm. Exam Location:  Inpatient Procedure: 2D Echo, Cardiac Doppler and Color Doppler Indications:    NSTEMI. Pre-CABG  History:        Patient has no prior history of Echocardiogram examinations.                 CAD; Cardiac cath today.  Sonographer:    Merrie Roof RDCS Referring Phys: [id number] [place]  1. Left ventricular ejection fraction, by estimation, is 50 to 55%. The left ventricle has low normal function. The left ventricle has no regional wall motion abnormalities. There is mild left ventricular hypertrophy. Left ventricular diastolic parameters are consistent with Grade II diastolic dysfunction (pseudonormalization). Elevated left atrial pressure.  2. Right ventricular systolic function is normal. The right ventricular size is normal.  3. Left atrial size was mildly dilated.  4. The mitral valve is degenerative. Mild mitral valve regurgitation. No evidence of mitral stenosis.  5. The aortic valve is tricuspid. Aortic valve regurgitation is not visualized. Aortic valve  sclerosis/calcification is present, without any evidence of aortic stenosis. Comparison(s): No prior Echocardiogram. FINDINGS  Left Ventricle: Left ventricular ejection fraction, by estimation, is 50 to 55%. The left ventricle has low normal function. The left ventricle has no regional wall motion abnormalities. The left ventricular internal cavity size was normal in size. There is mild left ventricular hypertrophy. Left ventricular diastolic parameters are consistent with Grade II diastolic dysfunction (pseudonormalization). Elevated left atrial pressure. Right Ventricle: The right ventricular size is normal. No increase in right ventricular wall thickness. Right ventricular systolic function is normal. Left Atrium: Left atrial size was mildly dilated. Right Atrium: Right atrial size was normal in size. Pericardium: There is no evidence of pericardial effusion. Mitral Valve: The mitral valve is  degenerative in appearance. Mild mitral valve regurgitation. No evidence of mitral valve stenosis. Tricuspid Valve: The tricuspid valve is grossly normal. Tricuspid valve regurgitation is trivial. No evidence of tricuspid stenosis. Aortic Valve: The aortic valve is tricuspid. Aortic valve regurgitation is not visualized. Aortic valve sclerosis/calcification is present, without any evidence of aortic stenosis. Aortic valve mean gradient measures 3.0 mmHg. Aortic valve peak gradient measures 5.0 mmHg. Aortic valve area, by VTI measures 2.66 cm. Pulmonic Valve: The pulmonic valve was not well visualized. Pulmonic valve regurgitation is not visualized. No evidence of pulmonic stenosis. Aorta: The aortic root and ascending aorta are structurally normal, with no evidence of dilitation. IAS/Shunts: The atrial septum is grossly normal.  LEFT VENTRICLE PLAX 2D LVIDd:         4.70 cm      Diastology LVIDs:         3.50 cm      LV e' medial:    3.81 cm/s LV PW:         1.30 cm      LV E/e' medial:  23.4 LV IVS:        1.20 cm      LV  e' lateral:   7.83 cm/s LVOT diam:     2.20 cm      LV E/e' lateral: 11.4 LV SV:         66 LV SV Index:   30 LVOT Area:     3.80 cm  LV Volumes (MOD) LV vol d, MOD A2C: 185.0 ml LV vol d, MOD A4C: 168.0 ml LV vol s, MOD A2C: 88.9 ml LV vol s, MOD A4C: 83.3 ml LV SV MOD A2C:     96.1 ml LV SV MOD A4C:     168.0 ml LV SV MOD BP:      92.2 ml RIGHT VENTRICLE RV Basal diam:  3.70 cm LEFT ATRIUM             Index        RIGHT ATRIUM           Index LA diam:        4.10 cm 1.88 cm/m   RA Area:     19.50 cm LA Vol (A2C):   93.0 ml 42.65 ml/m  RA Volume:   47.70 ml  21.88 ml/m LA Vol (A4C):   69.8 ml 32.01 ml/m LA Biplane Vol: 87.9 ml 40.31 ml/m  AORTIC VALVE AV Area (Vmax):    2.61 cm AV Area (Vmean):   2.49 cm AV Area (VTI):     2.66 cm AV Vmax:           112.00 cm/s AV Vmean:          77.600 cm/s AV VTI:            0.247 m AV Peak Grad:      5.0 mmHg AV Mean Grad:      3.0 mmHg LVOT Vmax:         77.00 cm/s LVOT Vmean:        50.800 cm/s LVOT VTI:          0.173 m LVOT/AV VTI ratio: 0.70  AORTA Ao Root diam: 3.70 cm Ao Asc diam:  3.10 cm MITRAL VALVE MV Area (PHT): 3.48 cm     SHUNTS MV Decel Time: 218 msec     Systemic VTI:  0.17 m MV E velocity: 89.10 cm/s   Systemic Diam: 2.20 cm MV A velocity: 118.00  cm/s MV E/A ratio:  0.76 Sunit Tolia DO Electronically signed by Rex Kras DO Signature Date/Time: 11/24/2021/11:25:55 PM    Final    ECHO INTRAOPERATIVE TEE  Result Date: 11/27/2021  *INTRAOPERATIVE TRANSESOPHAGEAL REPORT *  Patient Name:   Stephen Shelton Traxler Date of Exam: 11/25/2021 Medical Rec #:  [id number]       Height:       71.0 in Accession #:    [id number]      Weight:       216.4 lb Date of Birth:  [birth year]        BSA:          2.18 m Patient Age:    66 years        BP:           141/80 mmHg Patient Gender: M               HR:           71 bpm. Exam Location:  Anesthesiology Transesophogeal exam was perform intraoperatively during surgical procedure. Patient was closely monitored under general  anesthesia during the entirety of examination. Indications:     Coronary Artery Disease Sonographer:     Bernadene Person RDCS Performing Phys: [id number] Lucile Crater LIGHTFOOT Diagnosing Phys: Roderic Palau MD                                 MODIFIED REPORT   This report was modified by Roderic Palau MD on 11/27/2021 due to would't                          allow me to verify the report. Complications: No known complications during this procedure. POST-OP IMPRESSIONS Overall, there were no significant changes from pre-bypass. PRE-OP FINDINGS  Left Ventricle: The left ventricle has normal systolic function, with an ejection fraction of 55-60%. The cavity size was normal. There is no increase in left ventricular wall thickness. No evidence of left ventricular regional wall motion abnormalities. There is no left ventricular hypertrophy. Right Ventricle: The right ventricle has normal systolic function. The cavity was normal. There is no increase in right ventricular wall thickness. Left Atrium: Left atrial size was not assessed. No left atrial/left atrial appendage thrombus was detected. Right Atrium: Right atrial size was not assessed. Interatrial Septum: No atrial level shunt detected by color flow Doppler. Pericardium: There is no evidence of pericardial effusion. Mitral Valve: The mitral valve is normal in structure. Mitral valve regurgitation is mild by color flow Doppler. The MR jet is centrally-directed. There is mild calcification present on the mitral valve posterior cusp with normal mobility. Tricuspid Valve: The tricuspid valve was normal in structure. Tricuspid valve regurgitation was not visualized by color flow Doppler. Aortic Valve: The aortic valve is tricuspid Aortic valve regurgitation was not visualized by color flow Doppler. There is no stenosis of the aortic valve. There is mild thickening and mild calcification present on the aortic valve right coronary cusp with mildly decreased mobility.  Pulmonic Valve: The pulmonic valve was normal in structure. Pulmonic valve regurgitation is trivial by color flow Doppler. +--------------+--------++  LEFT VENTRICLE            +--------------+--------++  PLAX 2D                   +--------------+--------++  LVOT diam:     2.30 cm    +--------------+--------++  LVOT Area:     4.15 cm   +--------------+--------++                            +--------------+--------++  +-------------+-------++  AORTA                   +-------------+-------++  Ao Root diam: 3.30 cm   +-------------+-------++  Ao Asc diam:  3.30 cm   +-------------+-------++  +--------------+-------+  SHUNTS                  +--------------+-------+  Systemic Diam: 2.30 cm  +--------------+-------+  Roderic Palau MD Electronically signed by Roderic Palau MD Signature Date/Time: 11/25/2021/1:54:26 PM    Final (Updated)    CT Angio Chest/Abd/Pel for Dissection W and/or W/WO  Result Date: 11/24/2021 CLINICAL DATA:  Chest pain, back pain EXAM: CT ANGIOGRAPHY CHEST, ABDOMEN AND PELVIS TECHNIQUE: Non-contrast CT of the chest was initially obtained. Multidetector CT imaging through the chest, abdomen and pelvis was performed using the standard protocol during bolus administration of intravenous contrast. Multiplanar reconstructed images and MIPs were obtained and reviewed to evaluate the vascular anatomy. CONTRAST:  22mL OMNIPAQUE IOHEXOL 350 MG/ML SOLN COMPARISON:  CT abdomen and pelvis done on 08/11/2020 FINDINGS: CTA CHEST FINDINGS Cardiovascular: There is no evidence of mural hematoma in the noncontrast images of chest. Coronary artery calcifications are seen. There is homogeneous enhancement in thoracic aorta. There is no demonstrable intimal flap. There is ectasia of ascending thoracic aorta measures 4 cm. Major branches of thoracic aorta appear patent. Atherosclerotic plaques are noted in the proximal course of left subclavian artery. There is ectasia of main pulmonary artery measuring 3.8  cm. Mediastinum/Nodes: There are subcentimeter nodes in the mediastinum. Lungs/Pleura: There are small linear filling defects in the lumen of trachea, possibly mucus. There are small linear densities in the posterior right lower lung fields suggesting scarring or subsegmental atelectasis. There is no focal pulmonary consolidation. There is no pleural effusion or pneumothorax. Musculoskeletal: Unremarkable. Review of the MIP images confirms the above findings. CTA ABDOMEN AND PELVIS FINDINGS VASCULAR Aorta: There are scattered calcifications and atherosclerotic plaques without stenosis or aneurysmal dilation. There is no demonstrable intimal flap. Celiac: Unremarkable. SMA: There are atherosclerotic plaques and calcifications without high-grade stenosis or dissection. Renals: There are scattered atherosclerotic plaques. There is moderate narrowing of proximal course of left renal artery. IMA: Unremarkable. There are scattered atherosclerotic plaques and calcifications in the iliac arteries without significant stenosis or aneurysmal dilation. Review of the MIP images confirms the above findings. NON-VASCULAR Hepatobiliary: Liver measures 19.4 cm in length. There is fatty infiltration. Gallbladder is unremarkable. Pancreas: No focal abnormality is seen. Spleen: Unremarkable. Adrenals/Urinary Tract: There is mild nodularity in the adrenals. There is no hydronephrosis. There are no renal or ureteral stones. Urinary bladder is unremarkable. Stomach/Bowel: Small hiatal hernia is seen. Small bowel loops are not dilated. Appendix is not dilated. There is no significant wall thickening. Scattered diverticula are seen in the colon without signs of focal diverticulitis. Lymphatic: No significant lymphadenopathy seen. Reproductive: Prostate is enlarged. There are scattered calcifications in the prostate. Other: There is no ascites or pneumoperitoneum. Musculoskeletal: Degenerative changes are noted in the lumbar spine with  spinal stenosis and encroachment of neural foramina at multiple levels, more severe at L3-L4 level. Review of the MIP images confirms the above findings. IMPRESSION: There is no evidence dissection in thoracic and abdominal aorta. There are scattered atherosclerotic plaques and calcifications in  the aorta and the major branches. There is moderate narrowing in the proximal course of left renal artery. Coronary artery calcifications are seen. There is ectasia of main pulmonary artery suggesting pulmonary arterial hypertension. There are linear filling defects in the lumen of trachea, possibly mucus. There is no focal pulmonary consolidation. Small hiatal hernia. Enlarged fatty liver. Diverticulosis of colon. Severe lumbar spondylosis. Other findings as described in the body of the report. Electronically Signed   By: Elmer Picker M.D.   On: 11/24/2021 09:21   VAS US DOPPLER PRE CABG  Result Date: 11/25/2021 PREOPERATIVE VASCULAR EVALUATION Patient Name:  Stephen Shelton Babula  Date of Exam:   11/24/2021 Medical Rec #: [id number]        Accession #:    [id number] Date of Birth: August 23, 1956         Patient Gender: M Patient Age:   89 years Exam Location[facility]  [facility] Procedure:      VAS US DOPPLER PRE CABG Referring Phys: Remo Lipps HENDRICKSON --------------------------------------------------------------------------------  Indications:  Pre-CABG. Risk Factors: Hypertension, hyperlipidemia, Diabetes, current smoker, prior MI. Performing Technologist: Darlin Coco RDMS, RVT  Examination Guidelines: A complete evaluation includes B-mode imaging, spectral Doppler, color Doppler, and power Doppler as needed of all accessible portions of each vessel. Bilateral testing is considered an integral part of a complete examination. Limited examinations for reoccurring indications may be performed as noted.  Right Carotid Findings: +----------+--------+--------+--------+------------------------------+--------+             PSV  cm/s EDV cm/s Stenosis Describe                       Comments  +----------+--------+--------+--------+------------------------------+--------+  CCA Prox   70       14                                                         +----------+--------+--------+--------+------------------------------+--------+  CCA Distal 70       16                smooth, focal and heterogenous           +----------+--------+--------+--------+------------------------------+--------+  ICA Prox   39       13       1-39%    smooth, focal and heterogenous           +----------+--------+--------+--------+------------------------------+--------+  ICA Distal 58       16                                                         +----------+--------+--------+--------+------------------------------+--------+  ECA        123      12                                                         +----------+--------+--------+--------+------------------------------+--------+ +----------+--------+-------+----------------+------------+             PSV cm/s EDV cms Describe  Arm Pressure  +----------+--------+-------+----------------+------------+  Subclavian 87               Multiphasic, WNL               +----------+--------+-------+----------------+------------+ +---------+--------+--+--------+-+---------+  Vertebral PSV cm/s 35 EDV cm/s 8 Antegrade  +---------+--------+--+--------+-+---------+ Left Carotid Findings: +----------+--------+--------+--------+------------+------------------+             PSV cm/s EDV cm/s Stenosis Describe     Comments            +----------+--------+--------+--------+------------+------------------+  CCA Prox   82       15                                                 +----------+--------+--------+--------+------------+------------------+  CCA Distal 57       17                             intimal thickening  +----------+--------+--------+--------+------------+------------------+  ICA Prox   40       15       1-39%     heterogenous                     +----------+--------+--------+--------+------------+------------------+  ICA Distal 76       30                                                 +----------+--------+--------+--------+------------+------------------+  ECA        160      12                                                 +----------+--------+--------+--------+------------+------------------+ +----------+--------+--------+--------+------------+  Subclavian PSV cm/s EDV cm/s Describe Arm Pressure  +----------+--------+--------+--------+------------+             237               Stenotic               +----------+--------+--------+--------+------------+ +---------+--------+--+--------+--+---------+  Vertebral PSV cm/s 32 EDV cm/s 11 Antegrade  +---------+--------+--+--------+--+---------+  ABI Findings: +--------+------------------+-----+---------+--------+  Right    Rt Pressure (mmHg) Index Waveform  Comment   +--------+------------------+-----+---------+--------+  Brachial                          triphasic           +--------+------------------+-----+---------+--------+  PTA                               triphasic           +--------+------------------+-----+---------+--------+  DP                                triphasic           +--------+------------------+-----+---------+--------+ +--------+------------------+-----+----------+---------------------------------+  Left     Lt Pressure (mmHg) Index Waveform   Comment                            +--------+------------------+-----+----------+---------------------------------+  Brachial                          triphasic                                     +--------+------------------+-----+----------+---------------------------------+  PTA                               monophasic Area of stenosis at distal PTA w/                                                velocities of 323                   +--------+------------------+-----+----------+---------------------------------+  DP                                triphasic                                     +--------+------------------+-----+----------+---------------------------------+  Right Doppler Findings: +--------+--------+-----+---------+--------+  Site     Pressure Index Doppler   Comments  +--------+--------+-----+---------+--------+  Brachial                triphasic           +--------+--------+-----+---------+--------+  Radial                  triphasic           +--------+--------+-----+---------+--------+  Ulnar                   triphasic           +--------+--------+-----+---------+--------+  Left Doppler Findings: +--------+--------+-----+---------+--------+  Site     Pressure Index Doppler   Comments  +--------+--------+-----+---------+--------+  Brachial                triphasic           +--------+--------+-----+---------+--------+  Radial                  triphasic           +--------+--------+-----+---------+--------+  Ulnar                   triphasic           +--------+--------+-----+---------+--------+  Summary: Right Carotid: Velocities in the right ICA are consistent with a 1-39% stenosis. Left Carotid: Velocities in the left ICA are consistent with a 1-39% stenosis. Vertebrals:  Bilateral vertebral arteries demonstrate antegrade flow. Subclavians: Left subclavian artery was stenotic. Normal flow hemodynamics were              seen in the right subclavian artery. Right ABI: Triphasic waveforms observed in PTA and DPA. Left ABI: Area of stenosis observed at distal PTA with velocities of 323 cm/s.  Right Upper Extremity: Doppler waveforms remain within normal limits with right radial compression. Doppler waveform obliterate with right ulnar compression. Left Upper Extremity: Doppler waveform obliterate with left radial compression. Doppler waveforms remain within normal limits with left ulnar compression.  Electronically signed by  Deitra Mayo MD on 11/25/2021 at 6:30:33 AM.    Final        Discharge Instructions     Amb Referral to Cardiac Rehabilitation   Complete by: As directed    Diagnosis:  CABG NSTEMI     CABG X ___: 4   After initial evaluation and assessments completed: Virtual Based Care may be provided alone or in conjunction with Phase 2 Cardiac Rehab based on patient barriers.: Yes       Discharge Medications: Allergies as of 11/29/2021       Reactions   Metformin And Related Other (See Comments)   abd pain with med use   Sulfa Antibiotics Other (See Comments)   Lightheaded, intolerant, "felt sick"        Medication List     TAKE these medications    albuterol 108 (90 Base) MCG/ACT inhaler Commonly known as: VENTOLIN HFA Inhale 1-2 puffs into the lungs every 6 (six) hours as needed for wheezing or shortness of breath.   aspirin 81 MG EC tablet Take 1 tablet (81 mg total) by mouth daily. Swallow whole.   atorvastatin 80 MG tablet Commonly known as: LIPITOR Take 1 tablet (80 mg total) by mouth daily. What changed:  medication strength how much to take   clopidogrel 75 MG tablet Commonly known as: PLAVIX Take 1 tablet (75 mg total) by mouth daily.   furosemide 40 MG tablet Commonly known as: LASIX Take 1 tablet (40 mg total) by mouth daily. For 5 days then stop.   lipase/protease/amylase 36000 UNITS Cpep capsule Commonly known as: Creon TAKE 1-2 CAPSULES BY MOUTH 3 TIMES DAILY WITH MEALS.MAY ALSO TAKE 1 CAPSULE AS NEEDED W/ SNACKS   lisinopril 5 MG tablet Commonly known as: ZESTRIL Take 1 tablet (5 mg total) by mouth daily.   Metoprolol Tartrate 37.5 MG Tabs Take 37.5 mg by mouth 2 (two) times daily.   multivitamin tablet Take 1 tablet by mouth daily.   oxyCODONE 5 MG immediate release tablet Commonly known as: Oxy IR/ROXICODONE Take 1 tablet (5 mg total) by mouth every 6 (six) hours as needed for severe pain.   potassium chloride SA 20 MEQ  tablet Commonly known as: KLOR-CON M Take 1 tablet (20 mEq total) by mouth daily.       The patient has been discharged on:   1.Beta Blocker:  Yes [  x ]                              No   [   ]                              If No, reason:  2.Ace Inhibitor/ARB: Yes [ x  ]                                     No  [    ]                                     If No, reason:  3.Statin:   Yes [  x ]                  No  [   ]  If No, reason:  4.Ecasa:  Yes  [ x  ]                  No   [   ]                  If No, reason:  Patient had ACS upon admission:YES  Plavix/P2Y12 inhibitor: Yes [  x ]                                      No  [   ]   Follow Up Appointments:  Follow-up Information     Tolia, Sunit, DO. Go on 12/10/2021.   Specialties: Cardiology, Vascular Surgery Why: Appointment at 3:15 pm Contact information: [place] [address] [phone]         Melrose Nakayama, MD. Go on 12/29/2021.   Specialty: Cardiothoracic Surgery Why: Pa/LAT CXR to be taken (at [facility] which is in the same building as Dr. Leonarda Salon office) on 02/07 at 9:30 am;Appointment time is at 10:00 am Contact information: [place] [address] [phone]         Tonia Ghent, MD. Call.   Specialty: Family Medicine Why: for a follow up appointment regarding further surveillance of HGA1C 7.1. Contact information: [place] [address] [phone]                 Signed: Sharalyn Ink [facility] 11/29/2021, 10:18 AM

## 2021-11-27 ENCOUNTER — Inpatient Hospital Stay (HOSPITAL_COMMUNITY): Payer: HMO

## 2021-11-27 ENCOUNTER — Encounter (INDEPENDENT_AMBULATORY_CARE_PROVIDER_SITE_OTHER): Payer: HMO | Admitting: Ophthalmology

## 2021-11-27 ENCOUNTER — Encounter (HOSPITAL_COMMUNITY): Payer: Self-pay | Admitting: Thoracic Surgery (Cardiothoracic Vascular Surgery)

## 2021-11-27 LAB — BASIC METABOLIC PANEL
Anion gap: 5 (ref 5–15)
BUN: 17 mg/dL (ref 8–23)
CO2: 26 mmol/L (ref 22–32)
Calcium: 8.4 mg/dL — ABNORMAL LOW (ref 8.9–10.3)
Chloride: 100 mmol/L (ref 98–111)
Creatinine, Ser: 0.59 mg/dL — ABNORMAL LOW (ref 0.61–1.24)
GFR, Estimated: >60 mL/min (ref >60)
Glucose, Bld: 103 mg/dL — ABNORMAL HIGH (ref 70–99)
Potassium: 4 mmol/L (ref 3.5–5.1)
Sodium: 131 mmol/L — ABNORMAL LOW (ref 135–145)

## 2021-11-27 LAB — CBC
HCT: 31.9 % — ABNORMAL LOW (ref 39.0–52.0)
Hemoglobin: 10.4 g/dL — ABNORMAL LOW (ref 13.0–17.0)
MCH: 28.8 pg (ref 26.0–34.0)
MCHC: 32.6 g/dL (ref 30.0–36.0)
MCV: 88.4 fL (ref 80.0–100.0)
Platelets: 128 10*3/uL — ABNORMAL LOW (ref 150–400)
RBC: 3.61 MIL/uL — ABNORMAL LOW (ref 4.22–5.81)
RDW: 14.4 % (ref 11.5–15.5)
WBC: 12.4 10*3/uL — ABNORMAL HIGH (ref 4.0–10.5)
nRBC: 0 % (ref 0.0–0.2)

## 2021-11-27 LAB — GLUCOSE, CAPILLARY
Glucose-Capillary: 93 mg/dL (ref 70–99)
Glucose-Capillary: 102 mg/dL — ABNORMAL HIGH (ref 70–99)
Glucose-Capillary: 95 mg/dL (ref 70–99)
Glucose-Capillary: 206 mg/dL — ABNORMAL HIGH (ref 70–99)
Glucose-Capillary: 148 mg/dL — ABNORMAL HIGH (ref 70–99)

## 2021-11-27 LAB — ECHO INTRAOPERATIVE TEE
Height: 71 in
Weight: 3462.4 oz

## 2021-11-27 MED ORDER — FUROSEMIDE 40 MG PO TABS
40.0000 mg | ORAL_TABLET | Freq: Every day | ORAL | Status: DC
  Administered 2021-11-27: 40 mg via ORAL
  Filled 2021-11-27: qty 1

## 2021-11-27 MED ORDER — INSULIN ASPART 100 UNIT/ML IJ SOLN: 0.0000 [IU] | Freq: Every day | INTRAMUSCULAR | Status: DC

## 2021-11-27 MED ORDER — ALUM & MAG HYDROXIDE-SIMETH 200-200-20 MG/5ML PO SUSP: 15.0000 mL | Freq: Four times a day (QID) | ORAL | Status: DC | PRN

## 2021-11-27 MED ORDER — ~~LOC~~ CARDIAC SURGERY, PATIENT & FAMILY EDUCATION: Freq: Once | Status: DC

## 2021-11-27 MED ORDER — SODIUM CHLORIDE 0.9% FLUSH
3.0000 mL | Freq: Two times a day (BID) | INTRAVENOUS | Status: DC
  Administered 2021-11-27 – 2021-11-29 (×4): 3 mL via INTRAVENOUS

## 2021-11-27 MED ORDER — POTASSIUM CHLORIDE CRYS ER 10 MEQ PO TBCR
20.0000 meq | EXTENDED_RELEASE_TABLET | Freq: Every day | ORAL | Status: DC
  Administered 2021-11-27: 20 meq via ORAL
  Filled 2021-11-27 (×2): qty 2

## 2021-11-27 MED ORDER — SODIUM CHLORIDE 0.9 % IV SOLN: 250.0000 mL | INTRAVENOUS | Status: DC | PRN

## 2021-11-27 MED ORDER — MAGNESIUM HYDROXIDE 400 MG/5ML PO SUSP: 30.0000 mL | Freq: Every day | ORAL | Status: DC | PRN

## 2021-11-27 MED ORDER — INSULIN ASPART 100 UNIT/ML IJ SOLN
0.0000 [IU] | Freq: Three times a day (TID) | INTRAMUSCULAR | Status: DC
  Administered 2021-11-27: 5 [IU] via SUBCUTANEOUS
  Administered 2021-11-28 (×2): 3 [IU] via SUBCUTANEOUS

## 2021-11-27 MED ORDER — SODIUM CHLORIDE 0.9% FLUSH: 3.0000 mL | INTRAVENOUS | Status: DC | PRN

## 2021-11-27 MED FILL — Heparin Sodium (Porcine) Inj 1000 Unit/ML: Qty: 1000 | Status: AC

## 2021-11-27 MED FILL — Sodium Bicarbonate IV Soln 8.4%: INTRAVENOUS | Qty: 50 | Status: AC

## 2021-11-27 MED FILL — Potassium Chloride Inj 2 mEq/ML: INTRAVENOUS | Qty: 40 | Status: AC

## 2021-11-27 MED FILL — Heparin Sodium (Porcine) Inj 1000 Unit/ML: INTRAMUSCULAR | Qty: 20 | Status: AC

## 2021-11-27 MED FILL — Sodium Chloride IV Soln 0.9%: INTRAVENOUS | Qty: 3000 | Status: AC

## 2021-11-27 MED FILL — Electrolyte-R (PH 7.4) Solution: INTRAVENOUS | Qty: 3000 | Status: AC

## 2021-11-27 MED FILL — Magnesium Sulfate Inj 50%: INTRAMUSCULAR | Qty: 10 | Status: AC

## 2021-11-27 MED FILL — Lidocaine HCl (Cardiac) IV PF Soln 100 MG/5ML (2%): INTRAVENOUS | Qty: 5 | Status: AC

## 2021-11-27 NOTE — Progress Notes (Signed)
2 Days Post-Op Procedure(s) (LRB): CORONARY ARTERY BYPASS GRAFTING (CABG) X 4  ON PUMP USING LEFT INTERNAL MAMMARY ARTERY AND RIGHT ENDOSCOPIC GREATER SAPHENOUS VEIN CONDUITS (Right) ENDOVEIN HARVEST OF GREATER SAPHENOUS VEIN (Right) APPLICATION OF CELL SAVER Subjective: No complaints, took oxycodone one time last night  Objective: Vital signs in last 24 hours: Temp:  [97.7 F (36.5 C)-98.9 F (37.2 C)] 98.6 F (37 C) (01/06 0700) Pulse Rate:  [72-110] 82 (01/06 0700) Cardiac Rhythm: Normal sinus rhythm (01/06 0600) Resp:  [14-29] 22 (01/06 0700) BP: (96-145)/(58-90) 121/69 (01/06 0700) SpO2:  [88 %-96 %] 90 % (01/06 0700) Arterial Line BP: (158-180)/(53-72) 166/72 (01/05 0830) Weight:  [104.9 kg] 104.9 kg (01/06 0500)  Hemodynamic parameters for last 24 hours: PAP: (13-28)/(4-16) 28/16  Intake/Output from previous day: 01/05 0701 - 01/06 0700 In: 358.6 [I.V.:105.5; IV Piggyback:253.1] Out: 1980 [Urine:1880; Chest Tube:100] Intake/Output this shift: No intake/output data recorded.  General appearance: alert, cooperative, and no distress Neurologic: intact Heart: regular rate and rhythm Lungs: diminished breath sounds bibasilar Abdomen: normal findings: soft, non-tender  Lab Results: Recent Labs    11/26/21 1724 11/27/21 0333  WBC 13.9* 12.4*  HGB 11.7* 10.4*  HCT 33.9* 31.9*  PLT 141* 128*   BMET:  Recent Labs    11/26/21 1724 11/27/21 0333  NA 131* 131*  K 4.1 4.0  CL 101 100  CO2 25 26  GLUCOSE 171* 103*  BUN 16 17  CREATININE 0.80 0.59*  CALCIUM 8.7* 8.4*    PT/INR:  Recent Labs    11/25/21 1340  LABPROT 15.2  INR 1.2   ABG    Component Value Date/Time   PHART 7.344 (L) 11/25/2021 1856   HCO3 22.5 11/25/2021 1856   TCO2 24 11/25/2021 1856   ACIDBASEDEF 3.0 (H) 11/25/2021 1856   O2SAT 98.0 11/25/2021 1856   CBG (last 3)  Recent Labs    11/26/21 2311 11/27/21 0339 11/27/21 0702  GLUCAP 135* 93 102*    Assessment/Plan: S/P  Procedure(s) (LRB): CORONARY ARTERY BYPASS GRAFTING (CABG) X 4  ON PUMP USING LEFT INTERNAL MAMMARY ARTERY AND RIGHT ENDOSCOPIC GREATER SAPHENOUS VEIN CONDUITS (Right) ENDOVEIN HARVEST OF GREATER SAPHENOUS VEIN (Right) APPLICATION OF CELL SAVER Plan for transfer to step-down: see transfer orders POD # 2 Doing well CV- in Sr, ASA, statin, beta blocker, ACE-I  Start Plavix after pacing wires removed RESP- IS  RENAL- creatinine and lytes OK  PO Lasix and K ENDO- CBG well controlled  Dc levemir, SSI Ac and HS Gi- tolerating diet Continue ambulation SCD + enoxaparin Transfer to 4E   LOS: 3 days    Melrose Nakayama 11/27/2021

## 2021-11-27 NOTE — Progress Notes (Signed)
Progress Note  Patient Name: Stephen Shelton Date of Encounter: 11/27/2021  Attending physician: Melrose Nakayama, MD Primary care provider: Tonia Ghent, MD Primary Cardiologist: Rex Kras, DO, [facility]  Subjective: Stephen Shelton is a 66 y.o. male who was seen and examined at bedside  Family present at bedside. Chest tenderness at the incision site.  Denies anginal discomfort Ambulated in the unit twice since yesterday.  Chest tube and Swan d/c 1/5 Case discussed and reviewed with his nurse.  Objective: Vital Signs in the last 24 hours: Temp:  [98.3 F (36.8 C)-98.9 F (37.2 C)] 98.6 F (37 C) (01/06 0700) Pulse Rate:  [72-90] 84 (01/06 0901) Resp:  [14-29] 19 (01/06 0800) BP: (96-145)/(58-83) 141/77 (01/06 0901) SpO2:  [88 %-96 %] 92 % (01/06 0800) Weight:  [104.9 kg] 104.9 kg (01/06 0500)  Intake/Output:  Intake/Output Summary (Last 24 hours) at 11/27/2021 1220 Last data filed at 11/27/2021 0800 Gross per 24 hour  Intake 300 ml  Output 905 ml  Net -605 ml    Net IO Since Admission: -184.15 mL [11/27/21 1220]  Weights:  Filed Weights   11/24/21 1130 11/26/21 0500 11/27/21 0500  Weight: 98.2 kg 105.6 kg 104.9 kg    Telemetry: Personally reviewed.  Sinus rhythm with intermittent pacing  Physical examination: PHYSICAL EXAM: Vitals with BMI 11/27/2021 11/27/2021 11/27/2021  Height - - -  Weight - - -  BMI - - -  Systolic 914 782 956  Diastolic 77 66 69  Pulse 84 83 82    CONSTITUTIONAL: Well-developed and well-nourished. No acute distress.  SKIN: Skin is warm and dry. No rash noted. No cyanosis. No pallor. No jaundice HEAD: Normocephalic and atraumatic.  EYES: No scleral icterus MOUTH/THROAT: Moist oral membranes.  NECK: No JVD present. No thyromegaly noted. No carotid bruits.  Right IJ  LYMPHATIC: No visible cervical adenopathy.  CHEST Normal respiratory effort. No intercostal retractions. epicardial wires present.  Sternal dressing is clean and dry.    LUNGS: Clear to auscultation bilaterally upper lung fields with diminished breath sounds at the bases.  No stridor. No wheezes. No rales. CARDIOVASCULAR: Regular rate and rhythm, positive S1-S2, no murmurs rubs or gallops appreciated.  ABDOMINAL: Soft, nontender, nondistended, positive bowel sounds in all 4 quadrants, no apparent ascites.  EXTREMITIES: Trace bilateral pitting edema, warm to touch HEMATOLOGIC: No significant bruising NEUROLOGIC: Oriented to person, place, and time. Nonfocal. Normal muscle tone.  PSYCHIATRIC: Normal mood and affect. Normal behavior. Cooperative  Lab Results: Hematology Recent Labs  Lab 11/26/21 0300 11/26/21 1724 11/27/21 0333  WBC 13.6* 13.9* 12.4*  RBC 3.88* 3.87* 3.61*  HGB 11.4* 11.7* 10.4*  HCT 33.7* 33.9* 31.9*  MCV 86.9 87.6 88.4  MCH 29.4 30.2 28.8  MCHC 33.8 34.5 32.6  RDW 14.2 14.4 14.4  PLT 139* 141* 128*    Chemistry Recent Labs  Lab 11/23/21 2322 11/25/21 0421 11/26/21 0300 11/26/21 1724 11/27/21 0333  NA 137   < > 131* 131* 131*  K 3.9   < > 3.8 4.1 4.0  CL 106   < > 106 101 100  CO2 25   < > 21* 25 26  GLUCOSE 192*   < > 113* 171* 103*  BUN 12   < > 12 16 17   CREATININE 0.78   < > 0.59* 0.80 0.59*  CALCIUM 8.9   < > 8.1* 8.7* 8.4*  PROT 6.2*  --   --   --   --   ALBUMIN 3.7  --   --   --   --  AST 29  --   --   --   --   ALT 21  --   --   --   --   ALKPHOS 53  --   --   --   --   BILITOT 0.7  --   --   --   --   GFRNONAA >60   < > >60 >60 >60  ANIONGAP 6   < > 4* 5 5   < > = values in this interval not displayed.     Cardiac Enzymes: Cardiac Panel (last 3 results) No results for input(s): CKTOTAL, CKMB, TROPONINIHS, RELINDX in the last 72 hours.   BNP (last 3 results) Recent Labs    11/24/21 0907  BNP 113.8*    ProBNP (last 3 results) No results for input(s): PROBNP in the last 8760 hours.   DDimer No results for input(s): DDIMER in the last 168 hours.   Hemoglobin A1c:  Lab Results  Component  Value Date   HGBA1C 7.1 (H) 11/24/2021   MPG 157.07 11/24/2021    TSH  Recent Labs    11/24/21 0906  TSH 0.871    Lipid Panel     Component Value Date/Time   CHOL 91 06/04/2021 0808   CHOL 167 04/02/2020 0000   TRIG 59.0 06/04/2021 0808   TRIG 86 04/02/2020 0000   HDL 37.70 (L) 06/04/2021 0808   CHOLHDL 2 06/04/2021 0808   VLDL 11.8 06/04/2021 0808   LDLCALC 42 06/04/2021 0808   LDLCALC 107 04/02/2020 0000    Imaging: DG Chest Port 1 View  Result Date: 11/27/2021 CLINICAL DATA:  Status post cardiac surgery, difficulty breathing EXAM: PORTABLE CHEST 1 VIEW COMPARISON:  Previous studies including the examination of 11/26/2021. FINDINGS: Transverse diameter of heart is increased. There is interval decrease in pulmonary vascular congestion. There is interval removal of mediastinal drain and left chest tube. There is interval removal of Swan-Ganz catheter. Right IJ vascular sheath is still noted in place. There is blunting of lateral CP angles. There is no pneumothorax. IMPRESSION: There is interval decrease in pulmonary vascular congestion and pulmonary edema. There are no new focal infiltrates. Small bilateral pleural effusions. Electronically Signed   By: Elmer Picker M.D.   On: 11/27/2021 08:24   DG Chest Port 1 View  Result Date: 11/26/2021 CLINICAL DATA:  Status post coronary bypass surgery, difficulty breathing EXAM: PORTABLE CHEST 1 VIEW COMPARISON:  Previous studies including the examination of 11/25/2021 FINDINGS: Transverse diameter of heart is increased. Central pulmonary vessels are more prominent. Increased interstitial markings are seen in the parahilar regions and lower lung fields. There is interval removal of endotracheal tube and enteric tube. Tip of Swan-Ganz catheter is noted at the level of main pulmonary artery. Left chest tube is noted. Mediastinal drain is noted. There is blunting of left lateral CP angle. There is no pneumothorax. IMPRESSION: Central  pulmonary vessels are more prominent suggesting CHF. Increased interstitial and alveolar densities seen in the parahilar regions and lower lung fields, more so on the left side suggesting possible asymmetric pulmonary edema. Small left pleural effusion. Electronically Signed   By: Elmer Picker M.D.   On: 11/26/2021 08:21   DG Chest Port 1 View  Result Date: 11/25/2021 CLINICAL DATA:  Pneumothorax. EXAM: PORTABLE CHEST 1 VIEW COMPARISON:  Chest radiographs 11/23/2021 and CTA 11/24/2021 FINDINGS: Sequelae of interval CABG are identified. An endotracheal tube terminates at the clavicular heads, well above the carina. A right jugular Swan-Ganz catheter  terminates over the main pulmonary artery. An enteric tube terminates over the gastric fundus. A mediastinal drain and left chest tube are present. The cardiac silhouette is borderline enlarged. Lung volumes are decreased with hazy opacity in the left mid and lower lung. No large pleural effusion or pneumothorax is identified. IMPRESSION: 1. Interval CABG with support devices as above. 2. Low lung volumes with left mid and lower lung opacities, likely atelectasis. Electronically Signed   By: Logan Bores M.D.   On: 11/25/2021 14:20    CARDIAC DATABASE: EKG: 11/23/2020: NSR, 78 bpm, ST depressions inferior and lateral leads suggestive of inferolateral ischemia, without underlying injury pattern.   11/24/2021: NSR, 70 bpm, old anteroseptal infarct, ST depressions in inferior leads suggestive of inferior ischemia, without underlying injury pattern.  Echocardiogram: 11/24/2021:  1. Left ventricular ejection fraction, by estimation, is 50 to 55%. The  left ventricle has low normal function. The left ventricle has no regional wall motion abnormalities. There is mild left ventricular hypertrophy.  Left ventricular diastolic parameters are consistent with Grade II diastolic dysfunction (pseudonormalization). Elevated left atrial pressure.   2. Right ventricular  systolic function is normal. The right ventricular size is normal.   3. Left atrial size was mildly dilated.   4. The mitral valve is degenerative. Mild mitral valve regurgitation. No evidence of mitral stenosis.   5. The aortic valve is tricuspid. Aortic valve regurgitation is not visualized. Aortic valve sclerosis/calcification is present, without any evidence of aortic stenosis.   Stress test: None  Heart catheterization: November 24, 2021: LM: Normal LAD: Complex mid LAD/Diag2/septal 90% trifurcation lesion ([place] 1,1,1) Lcx: Prox OM2 80% stenosis RCA: Prox CTO. Epicardial right-to-right and left-to-right collaterals, mostly from LAD septals   LVEDP normal LVEF by LV gram 50-55%.    Continue Aspirin, heparin, statin, blood pressure control CVTS consulted for CABG  Carotid duplex: 11/24/2021: Right Carotid: Velocities in the right ICA are consistent with a 1-39% stenosis.  Left Carotid: Velocities in the left ICA are consistent with a 1-39% stenosis.  Vertebrals:  Bilateral vertebral arteries demonstrate antegrade flow.  Subclavians: Left subclavian artery was stenotic. Normal flow hemodynamics were seen in the right subclavian artery.   Ankle-brachial index: 11/24/2021: Right ABI: Triphasic waveforms observed in PTA and DPA.  Left ABI: Area of stenosis observed at distal PTA with velocities of 323 cm/s.  Upper extremity arterial duplex: 11/24/2021: Right Upper Extremity: Doppler waveforms remain within normal limits with right radial compression. Doppler waveform obliterate with right ulnar compression.  Left Upper Extremity: Doppler waveform obliterate with left radial compression. Doppler waveforms remain within normal limits with left ulnar compression.  Scheduled Meds:  acetaminophen  1,000 mg Oral Q6H   Or   acetaminophen (TYLENOL) oral liquid 160 mg/5 mL  1,000 mg Per Tube Q6H   aspirin EC  81 mg Oral Daily   Or   aspirin  81 mg Per Tube Daily   atorvastatin  80 mg  Oral QHS   bisacodyl  10 mg Oral Daily   Or   bisacodyl  10 mg Rectal Daily   Chlorhexidine Gluconate Cloth  6 each Topical Daily   docusate sodium  200 mg Oral Daily   enoxaparin (LOVENOX) injection  40 mg Subcutaneous QHS   furosemide  40 mg Oral Daily   insulin aspart  0-15 Units Subcutaneous TID WC   insulin aspart  0-5 Units Subcutaneous QHS   lisinopril  5 mg Oral Daily   metoprolol tartrate  25 mg Oral BID  Or   metoprolol tartrate  25 mg Per Tube BID   multivitamin with minerals  1 tablet Oral Daily   pantoprazole  40 mg Oral Daily   potassium chloride  20 mEq Oral Daily   sodium chloride flush  3 mL Intravenous Q12H    Continuous Infusions:  sodium chloride     sodium chloride     sodium chloride 10 mL/hr at 11/25/21 1330   dexmedetomidine (PRECEDEX) IV infusion 0 mcg/kg/hr (11/25/21 1659)   lactated ringers Stopped (11/26/21 0603)   lactated ringers Stopped (11/26/21 1610)   nitroGLYCERIN Stopped (11/26/21 1150)   phenylephrine (NEO-SYNEPHRINE) Adult infusion Stopped (11/25/21 1511)    PRN Meds: sodium chloride, dextrose, metoprolol tartrate, midazolam, morphine injection, ondansetron (ZOFRAN) IV, oxyCODONE, sodium chloride flush, traMADol   IMPRESSION & RECOMMENDATIONS: Stephen Shelton is a 83 y.o. 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  Postop day 2 four-vessel CABG (LIMA to the LAD, SVG to diagonal, SVG to OM, SVG to PDA).  Presented as non-STEMI, difficult cardiac discomfort, high sensitive troponins peaked at 12,338. Left heart catheterization noted multivessel CAD. In the setting of underlying diabetes and multivessel CAD CT surgery was consulted and patient underwent surgical  revascularization. Progressing well. Extubated postop day 0 Plans on removal of right IJ later today. Pacer wires in place.  Continue telemetry to monitor for dysrhythmias/A. Fib.  Encouraged bedside spirometry as tolerated with a goal 10 times an hour while awake -to help prevent atelectasis/pneumonia. Ambulate per CVICU protocol.  Continue lopressor now and transition to Toprol XL at discharge.  CT surgery plans to start Plavix once pacer wires are d/c - agree / appropriate.  Continue high intensity statin therapy. Plan to transfer to floor later today.   Postop day 2 right lower extremity vein harvesting: Incision site clean dry and intact.  No evidence of cellulitis.  Continue to monitor.  NSTEMI Status post surgical revascularization due to multivessel CAD and underlying diabetes. Recommend dual antiplatelet therapy when medically safe. Continue Lopressor for now we will transition to Toprol-XL prior to discharge Will uptitrate GDMT in a stepwise fashion as hemodynamics and laboratory values allow. Lasix for volume management.  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 Last hemoglobin A1c 6.9 as of 10/26/2021   HTN: Restart home blood pressure medications.  Monitor BPs.   Mixed HLD.   Continue atorvastatin to 80 mg p.o. nightly.   Cigarette smoking: Currently smokes 1 pack/day for the last 30 years at least. Educated on importance of complete smoking cessation.  Pre-CABG lab work noted questionably abnormal urine analysis.  Requested his nurse to see if it can be sent to culture as he does have a history of UTIs in the past.  Patient denies dysuria, no gross hematuria, flank pain.  May needs outpatient follow-up.  Will follow peripherally.  Over the weekend please contact Dr. Virgina Jock if cardiovascular questions or concerns arise.  Patient's questions and concerns were addressed to  his satisfaction. He voices understanding of the instructions provided during this encounter.   This note was created using a voice recognition software as a result there may be grammatical errors inadvertently enclosed that do not reflect the nature of this encounter. Every attempt is made  to correct such errors.  Mechele Claude [facility]  Pager: [phone] Office: [phone] 11/27/2021, 12:20 PM

## 2021-11-27 NOTE — Progress Notes (Signed)
CARDIAC REHAB PHASE I   PRE:  Rate/Rhythm: 71  SR  BP:  Sitting: 119/73      SaO2: 95 RA  MODE:  Ambulation: 740 ft   POST:  Rate/Rhythm: 90 SR  BP:  Sitting: 139/97 --> 128/75    SaO2: 94 RA   Pt ambulated 754ft in hallway standby assist with steady gait. Pt denies CP, dizziness, or SOB throughout walk. Pt returned to bed. D/c education completed with pt and wife. Pt educated on importance of site care and monitoring incisions daily. Encouraged continued IS use, walks, and sternal precautions. Pt given in-the-tube sheet along with heart healthy and diabetic diets. Reviewed restrictions and exercise guidelines. Will refer to CRP II [place]. Pt hopeful for d/c Sunday. Will continue to follow.  [id number] Rufina Falco, RN BSN 11/27/2021 10:46 AM

## 2021-11-27 NOTE — Care Management (Signed)
°  Transition of Care [facility]) Screening Note   Patient Details  Name: Stephen Shelton Date of Birth: 1956/07/21   Transition of Care [facility]) CM/SW Contact:    Bethena Roys, RN Phone Number: 11/27/2021, 2:52 PM    Transition of Care Department [facility]) has reviewed patient and no TOC needs have been identified at this time. We will continue to monitor patient advancement through interdisciplinary progression rounds. If new patient transition needs arise, please place a TOC consult.

## 2021-11-28 ENCOUNTER — Inpatient Hospital Stay (HOSPITAL_COMMUNITY): Payer: HMO

## 2021-11-28 LAB — BASIC METABOLIC PANEL
Anion gap: 4 — ABNORMAL LOW (ref 5–15)
BUN: 16 mg/dL (ref 8–23)
CO2: 26 mmol/L (ref 22–32)
Calcium: 8.5 mg/dL — ABNORMAL LOW (ref 8.9–10.3)
Chloride: 102 mmol/L (ref 98–111)
Creatinine, Ser: 0.62 mg/dL (ref 0.61–1.24)
GFR, Estimated: >60 mL/min (ref >60)
Glucose, Bld: 108 mg/dL — ABNORMAL HIGH (ref 70–99)
Potassium: 3.7 mmol/L (ref 3.5–5.1)
Sodium: 132 mmol/L — ABNORMAL LOW (ref 135–145)

## 2021-11-28 LAB — GLUCOSE, CAPILLARY
Glucose-Capillary: 112 mg/dL — ABNORMAL HIGH (ref 70–99)
Glucose-Capillary: 176 mg/dL — ABNORMAL HIGH (ref 70–99)
Glucose-Capillary: 191 mg/dL — ABNORMAL HIGH (ref 70–99)
Glucose-Capillary: 149 mg/dL — ABNORMAL HIGH (ref 70–99)

## 2021-11-28 LAB — CBC
HCT: 31.1 % — ABNORMAL LOW (ref 39.0–52.0)
Hemoglobin: 10.3 g/dL — ABNORMAL LOW (ref 13.0–17.0)
MCH: 28.6 pg (ref 26.0–34.0)
MCHC: 33.1 g/dL (ref 30.0–36.0)
MCV: 86.4 fL (ref 80.0–100.0)
Platelets: 134 10*3/uL — ABNORMAL LOW (ref 150–400)
RBC: 3.6 MIL/uL — ABNORMAL LOW (ref 4.22–5.81)
RDW: 14.4 % (ref 11.5–15.5)
WBC: 10.8 10*3/uL — ABNORMAL HIGH (ref 4.0–10.5)
nRBC: 0 % (ref 0.0–0.2)

## 2021-11-28 MED ORDER — CLOPIDOGREL BISULFATE 75 MG PO TABS
75.0000 mg | ORAL_TABLET | Freq: Every day | ORAL | Status: DC
  Administered 2021-11-29: 75 mg via ORAL
  Filled 2021-11-28: qty 1

## 2021-11-28 MED ORDER — POTASSIUM CHLORIDE CRYS ER 20 MEQ PO TBCR
30.0000 meq | EXTENDED_RELEASE_TABLET | Freq: Two times a day (BID) | ORAL | Status: AC
  Administered 2021-11-28 (×2): 30 meq via ORAL
  Filled 2021-11-28 (×2): qty 1

## 2021-11-28 MED ORDER — POTASSIUM CHLORIDE CRYS ER 20 MEQ PO TBCR
20.0000 meq | EXTENDED_RELEASE_TABLET | Freq: Every day | ORAL | Status: DC
  Administered 2021-11-29: 20 meq via ORAL
  Filled 2021-11-28: qty 1

## 2021-11-28 MED ORDER — METOPROLOL TARTRATE 25 MG/10 ML ORAL SUSPENSION
37.5000 mg | Freq: Two times a day (BID) | ORAL | Status: DC
  Filled 2021-11-28: qty 15

## 2021-11-28 MED ORDER — FUROSEMIDE 10 MG/ML IJ SOLN
40.0000 mg | Freq: Once | INTRAMUSCULAR | Status: AC
  Administered 2021-11-28: 40 mg via INTRAVENOUS
  Filled 2021-11-28: qty 4

## 2021-11-28 MED ORDER — FUROSEMIDE 40 MG PO TABS
40.0000 mg | ORAL_TABLET | Freq: Every day | ORAL | Status: DC
  Administered 2021-11-29: 40 mg via ORAL
  Filled 2021-11-28: qty 1

## 2021-11-28 MED ORDER — METOPROLOL TARTRATE 25 MG PO TABS
37.5000 mg | ORAL_TABLET | Freq: Two times a day (BID) | ORAL | Status: DC
  Administered 2021-11-28 – 2021-11-29 (×3): 37.5 mg via ORAL
  Filled 2021-11-28 (×3): qty 1

## 2021-11-28 NOTE — Progress Notes (Signed)
CARDIAC REHAB PHASE I   PRE:  Rate/Rhythm: 76 SR  BP:  Supine:   Sitting: 115/64  Standing:    SaO2: 96% RA  MODE:  Ambulation: 460 ft   POST:  Rate/Rhythm: 100 ST  BP:  Supine:   Sitting:   Standing: To BR, unable to take   SaO2: 95% RA Ambulating independently without difficulty, wants to go home.  Has walked 2 times earlier today. [id number] Liliane Channel RN, BSN 11/28/2021 10:02 AM

## 2021-11-28 NOTE — Progress Notes (Signed)
Removed epicardial pacing wires per order. Ends intact. VSS. Patient tolerated well. Patient educated on bedrest for one hour. Patient verbalized understanding.  Daymon Larsen, RN

## 2021-11-28 NOTE — Progress Notes (Addendum)
° °   °  [address]       [place] [address]             [phone]        3 Days Post-Op Procedure(s) (LRB): CORONARY ARTERY BYPASS GRAFTING (CABG) X 4  ON PUMP USING LEFT INTERNAL MAMMARY ARTERY AND RIGHT ENDOSCOPIC GREATER SAPHENOUS VEIN CONDUITS (Right) ENDOVEIN HARVEST OF GREATER SAPHENOUS VEIN (Right) APPLICATION OF CELL SAVER  Subjective: Patient walked 3 times yesterday. Has been up since 4:30 am this am. He has no specific complaint.  Objective: Vital signs in last 24 hours: Temp:  [98.1 F (36.7 C)-98.4 F (36.9 C)] 98.2 F (36.8 C) (01/07 0422) Pulse Rate:  [82-101] 98 (01/07 0422) Cardiac Rhythm: Normal sinus rhythm (01/06 2112) Resp:  [15-21] 18 (01/07 0422) BP: (111-141)/(65-87) 124/75 (01/07 0422) SpO2:  [92 %-98 %] 95 % (01/07 0422)  Pre op weight 98.2 kg Current Weight  11/27/21 104.9 kg       Intake/Output from previous day: 01/06 0701 - 01/07 0700 In: 340 [P.O.:240; IV Piggyback:100] Out: 100 [Urine:100]   Physical Exam:  Cardiovascular: RRR Pulmonary: Slightly diminished bibasilar breath sounds Abdomen: Soft, non tender, bowel sounds present. Extremities: Mild bilateral lower extremity edema. Wounds: Clean and dry.  No erythema or signs of infection.  Lab Results: CBC: Recent Labs    11/27/21 0333 11/28/21 0118  WBC 12.4* 10.8*  HGB 10.4* 10.3*  HCT 31.9* 31.1*  PLT 128* 134*   BMET:  Recent Labs    11/27/21 0333 11/28/21 0118  NA 131* 132*  K 4.0 3.7  CL 100 102  CO2 26 26  GLUCOSE 103* 108*  BUN 17 16  CREATININE 0.59* 0.62  CALCIUM 8.4* 8.5*    PT/INR:  Lab Results  Component Value Date   INR 1.2 11/25/2021   INR 1.0 11/24/2021   ABG:  INR: Will add last result for INR, ABG once components are confirmed Will add last 4 CBG results once components are confirmed  Assessment/Plan:  1. CV - S/p NSTEMI. SR. On Lopressor 25 mg bid and Lisinopril 5 mg daily. Will increase Lopressor for better HR  control. Start Plavix in am (EPW removed today) 2.  Pulmonary - On room air. CXR this am appears to show small bilateral pleural effusions. Encourage incentive spirometer. 3. Volume Overload - Will give Lasix 40 mg IV this am 4.  Expected post op acute blood loss anemia - H and H this am stable at 10.3 and 31.1 5. DM-CBGs 206/148/112. Pre op HGA1C 7.1. He has been diet controlled.He will need to follow up with medical doctor after discharge 6. Supplement potassium 7. Mild thrombocytopenia-platelets this am up to 134,000 8. Remove EPW 9. Possibly home in am  Donielle M ZimmermanPA-C 11/28/2021,7:35 AM   Doing well Home some  [place]

## 2021-11-29 ENCOUNTER — Other Ambulatory Visit: Payer: Self-pay | Admitting: Physician Assistant

## 2021-11-29 LAB — GLUCOSE, CAPILLARY: Glucose-Capillary: 113 mg/dL — ABNORMAL HIGH (ref 70–99)

## 2021-11-29 MED ORDER — OXYCODONE HCL 5 MG PO TABS
5.0000 mg | ORAL_TABLET | Freq: Four times a day (QID) | ORAL | 0 refills | Status: DC | PRN
Start: 2021-11-29 — End: 2021-12-07

## 2021-11-29 MED ORDER — ATORVASTATIN CALCIUM 80 MG PO TABS: 80.0000 mg | ORAL_TABLET | Freq: Every day | ORAL | 0 refills | Status: DC

## 2021-11-29 MED ORDER — ASPIRIN 81 MG PO TBEC: 81.0000 mg | DELAYED_RELEASE_TABLET | Freq: Every day | ORAL | 11 refills | Status: DC

## 2021-11-29 MED ORDER — POTASSIUM CHLORIDE CRYS ER 20 MEQ PO TBCR: 20.0000 meq | EXTENDED_RELEASE_TABLET | Freq: Every day | ORAL | 0 refills | Status: DC

## 2021-11-29 MED ORDER — METOPROLOL TARTRATE 37.5 MG PO TABS: 37.5000 mg | ORAL_TABLET | Freq: Two times a day (BID) | ORAL | 1 refills | Status: DC

## 2021-11-29 MED ORDER — FUROSEMIDE 40 MG PO TABS: 40.0000 mg | ORAL_TABLET | Freq: Every day | ORAL | 0 refills | Status: DC

## 2021-11-29 MED ORDER — CLOPIDOGREL BISULFATE 75 MG PO TABS: 75.0000 mg | ORAL_TABLET | Freq: Every day | ORAL | 1 refills | Status: DC

## 2021-11-29 NOTE — Progress Notes (Addendum)
° °   °  [address]       [place] [address]             [phone]        4 Days Post-Op Procedure(s) (LRB): CORONARY ARTERY BYPASS GRAFTING (CABG) X 4  ON PUMP USING LEFT INTERNAL MAMMARY ARTERY AND RIGHT ENDOSCOPIC GREATER SAPHENOUS VEIN CONDUITS (Right) ENDOVEIN HARVEST OF GREATER SAPHENOUS VEIN (Right) APPLICATION OF CELL SAVER  Subjective: Patient without complaints and wants to go home.  Objective: Vital signs in last 24 hours: Temp:  [98 F (36.7 C)-99 F (37.2 C)] 98.2 F (36.8 C) (01/08 0400) Pulse Rate:  [76-100] 77 (01/08 0400) Cardiac Rhythm: Normal sinus rhythm (01/07 1941) Resp:  [18-20] 20 (01/08 0400) BP: (102-140)/(62-79) 106/67 (01/08 0400) SpO2:  [93 %-97 %] 96 % (01/08 0400) Weight:  [103.1 kg] 103.1 kg (01/08 0600)  Pre op weight 98.2 kg Current Weight  11/29/21 103.1 kg       Intake/Output from previous day: 01/07 0701 - 01/08 0700 In: 1680 [P.O.:1680] Out: -    Physical Exam:  Cardiovascular: RRR Pulmonary: Slightly diminished bibasilar breath sounds Abdomen: Soft, non tender, bowel sounds present. Extremities: Mild bilateral lower extremity edema. Wounds: Clean and dry.  No erythema or signs of infection.  Lab Results: CBC: Recent Labs    11/27/21 0333 11/28/21 0118  WBC 12.4* 10.8*  HGB 10.4* 10.3*  HCT 31.9* 31.1*  PLT 128* 134*    BMET:  Recent Labs    11/27/21 0333 11/28/21 0118  NA 131* 132*  K 4.0 3.7  CL 100 102  CO2 26 26  GLUCOSE 103* 108*  BUN 17 16  CREATININE 0.59* 0.62  CALCIUM 8.4* 8.5*     PT/INR:  Lab Results  Component Value Date   INR 1.2 11/25/2021   INR 1.0 11/24/2021   ABG:  INR: Will add last result for INR, ABG once components are confirmed Will add last 4 CBG results once components are confirmed  Assessment/Plan:  1. CV - S/p NSTEMI. SR. On Lopressor 37.5 mg bid and Lisinopril 5 mg daily.  Start Plavix 2.  Pulmonary - On room air. Encourage incentive  spirometer. 3. Volume Overload - Will continue Lasix 40 mg daily for several days post op 4.  Expected post op acute blood loss anemia - Last H and H stable at 10.3 and 31.1 5. DM-CBGs 191/149/113. Pre op HGA1C 7.1. He has been diet controlled.He will need to follow up with medical doctor after discharge 7. Mild thrombocytopenia-Last platelets up to 134,000 8. Some loose stools so will stop stool softeners 9. Discharge  Thecla Forgione M ZimmermanPA-C 11/29/2021,7:49 AM

## 2021-11-29 NOTE — Progress Notes (Signed)
Patient given discharge instructions. Wife present. PIV removed. Telemetry box removed, CCMD notified, placed up front at nurses station in cubby. Patient taken to vehicle by wheelchair by staff.  Daymon Larsen, RN

## 2021-11-30 ENCOUNTER — Telehealth: Payer: Self-pay

## 2021-11-30 NOTE — Telephone Encounter (Signed)
Transition Care Management Follow-up Telephone Call Date of discharge and from where: [place] [facility] 11-29-21 Dx: NSTEMI/ elevated HgA1c 7.1 How have you been since you were released from the hospital? Doing pretty good  Any questions or concerns? No  Items Reviewed: Did the pt receive and understand the discharge instructions provided? Yes  Medications obtained and verified? Yes  Other? No  Any new allergies since your discharge? Yes  Dietary orders reviewed? Yes Do you have support at home? Yes   Home Care and Equipment/Supplies: Were home health services ordered? no If so, what is the name of the agency? na  Has the agency set up a time to come to the patient's home? not applicable Were any new equipment or medical supplies ordered?  No What is the name of the medical supply agency? na Were you able to get the supplies/equipment? not applicable Do you have any questions related to the use of the equipment or supplies? No  Functional Questionnaire: (I = Independent and D = Dependent) ADLs: I  Bathing/Dressing- I  Meal Prep- I  Eating- I  Maintaining continence- I  Transferring/Ambulation- I  Managing Meds- I  Follow up appointments reviewed:  PCP Hospital f/u appt confirmed? Yes  [facility]uled to [facility]ings [facility]  Scheduled to see Dr Terri Skains on 12-10-21 @ 315pm. Are transportation arrangements needed? No  If their condition worsens, is the pt aware to call PCP or go to the Emergency Dept.? Yes Was the patient provided with contact information for the PCP's office or ED? Yes Was to pt encouraged to call back with questions or concerns? Yes

## 2021-12-07 ENCOUNTER — Ambulatory Visit (INDEPENDENT_AMBULATORY_CARE_PROVIDER_SITE_OTHER): Payer: HMO | Admitting: Family Medicine

## 2021-12-07 ENCOUNTER — Encounter: Payer: Self-pay | Admitting: Family Medicine

## 2021-12-07 ENCOUNTER — Other Ambulatory Visit: Payer: Self-pay

## 2021-12-07 ENCOUNTER — Telehealth (HOSPITAL_COMMUNITY): Payer: Self-pay

## 2021-12-07 ENCOUNTER — Encounter (INDEPENDENT_AMBULATORY_CARE_PROVIDER_SITE_OTHER): Payer: HMO | Admitting: Ophthalmology

## 2021-12-07 VITALS — BP 118/62 | HR 69 | Temp 98.0°F | Ht 71.0 in | Wt 229.0 lb

## 2021-12-07 DIAGNOSIS — I2510 Atherosclerotic heart disease of native coronary artery without angina pectoris: Secondary | ICD-10-CM | POA: Diagnosis not present

## 2021-12-07 LAB — CBC WITH DIFFERENTIAL/PLATELET
Basophils Absolute: 0.1 10*3/uL (ref 0.0–0.1)
Basophils Relative: 0.5 % (ref 0.0–3.0)
Eosinophils Absolute: 0.3 10*3/uL (ref 0.0–0.7)
Eosinophils Relative: 2.1 % (ref 0.0–5.0)
HCT: 35.3 % — ABNORMAL LOW (ref 39.0–52.0)
Hemoglobin: 11.6 g/dL — ABNORMAL LOW (ref 13.0–17.0)
Lymphocytes Relative: 16.9 % (ref 12.0–46.0)
Lymphs Abs: 2.3 10*3/uL (ref 0.7–4.0)
MCHC: 33 g/dL (ref 30.0–36.0)
MCV: 85.7 fl (ref 78.0–100.0)
Monocytes Absolute: 0.9 10*3/uL (ref 0.1–1.0)
Monocytes Relative: 6.6 % (ref 3.0–12.0)
Neutro Abs: 10.3 10*3/uL — ABNORMAL HIGH (ref 1.4–7.7)
Neutrophils Relative %: 73.9 % (ref 43.0–77.0)
Platelets: 407 10*3/uL — ABNORMAL HIGH (ref 150.0–400.0)
RBC: 4.12 Mil/uL — ABNORMAL LOW (ref 4.22–5.81)
RDW: 15 % (ref 11.5–15.5)
WBC: 13.9 10*3/uL — ABNORMAL HIGH (ref 4.0–10.5)

## 2021-12-07 LAB — BASIC METABOLIC PANEL
BUN: 14 mg/dL (ref 6–23)
CO2: 28 mEq/L (ref 19–32)
Calcium: 9.1 mg/dL (ref 8.4–10.5)
Chloride: 101 mEq/L (ref 96–112)
Creatinine, Ser: 0.68 mg/dL (ref 0.40–1.50)
GFR: 97.47 mL/min (ref >60.00)
Glucose, Bld: 172 mg/dL — ABNORMAL HIGH (ref 70–99)
Potassium: 4.6 mEq/L (ref 3.5–5.1)
Sodium: 136 mEq/L (ref 135–145)

## 2021-12-07 MED ORDER — NITROGLYCERIN 0.4 MG SL SUBL: 0.4000 mg | SUBLINGUAL_TABLET | SUBLINGUAL | 3 refills | Status: AC | PRN

## 2021-12-07 NOTE — Patient Instructions (Addendum)
Go to the lab on the way out.   If you have mychart we'll likely use that to update you.    Take care.  Glad to see you. I'll await the cardiology appointment notes.   Let me know if nicotine gum doesn't help.

## 2021-12-07 NOTE — Progress Notes (Signed)
This visit occurred during the SARS-CoV-2 public health emergency.  Safety protocols were in place, including screening questions prior to the visit, additional usage of staff PPE, and extensive cleaning of exam room while observing appropriate contact time as indicated for disinfecting solutions.  Inpatient f/u.  CAD.    Admit date: 11/23/2021 Discharge date: 11/29/2021   Admission Diagnoses: NSTEMI (non-ST elevated myocardial infarction) ([place]) 2. Coronary artery disease   Discharge Diagnoses:  S/P CABG x 4 Expected post op blood loss anemia History of Benign hypertension History of diabetes mellitus History of hyperlipidemia History of tobacco abuse History of septic arthritis (HCC)      Consults: None   Procedure (s):  Median sternotomy, extracorporeal circulation, coronary artery bypass grafting x 4 (left internal mammary artery to LAD, saphenous vein graft to first diagonal, saphenous vein graft to the obtuse marginal 1, saphenous vein graft to  posterior descending), endoscopic vein harvest, right leg by Dr. Roxan Hockey on 11/25/2021.   HPI: This is a 66 year old male admitted following an NSTEMI.  He underwent a left heart cath which showed three-vessel coronary artery disease.  The patient states that he originally had sharp back pain while sitting at a desk earlier today.  This lasted for several hours once EMS was called this was relieved with sublingual nitroglycerin.  He admits to a several week history of anginal symptoms.  He has been able to keep up with this activity however.  He played golf 2 times last week without any symptoms. Dr. Kipp Brood discussed the need for coronary artery bypass grafting surgery. Potential risks, benefits, and complications of the surgery were discussed with the patient and he agreed to proceed with surgery. Pre operative carotid duplex US shows no significant internal carotid artery stenoses bilaterally.   Hospital Course: Patient underwent a  CABG x 4. He was transferred from the OR to [facility] ICU in stable condition. He was extubated successfully early the evening of surgery. He remained afebrile and hemodynamically stable. He was weaned off Nitro drip. Gordy Councilman, a line, chest tubes, and foley were all removed early in his post operative course. He was started on Lopressor. He was volume overloaded and diuresed accordingly. He was transitioned off the Insulin drip. Of note, his pre op HGA1C is 7.1. He is diet controlled.  Epicardial pacing wires were removed prior to starting Plavix. He had mild thrombocytopenia post op as well as expected post op blood loss anemia. He did not require a post op transfusion. His last H and H was 10.3 and 31.3.  Patient was felt surgically stable for transfer from the ICU to 4E for further convalescence on 11/27/2021. Epicardial pacing wires were removed on 01/07. He was started on Plavix and continue on baby ec asa 81 mg daily for NSTEMI. Lopressor was increased to 37.5 mg bid for better HR control and he was restarted on low dose Lisinopril. He has been ambulating on room air with good oxygenation. He has been tolerating a diet and has had a bowel movement. All wounds are clean, dry, healing without signs of infection. He is felt surgically stable for discharge today.     Latest Vital Signs: Blood pressure 134/84, pulse 73, temperature 98.1 F (36.7 C), temperature source Oral, resp. rate 20, height 5\' 11"  (1.803 m), weight 103.1 kg, SpO2 97%.   Physical Exam: Cardiovascular: RRR Pulmonary: Slightly diminished bibasilar breath sounds Abdomen: Soft, non tender, bowel sounds present. Extremities: Mild bilateral lower extremity edema. Wounds: Clean and dry.  No  erythema or signs of infection.     Discharge Condition:Stable and discharged to home.    ========================= Inpatient course discussed with patient.  Admitted with NSTEMI.  Pathophysiology discussed with patient.  He had catheterization done  and needed bypass.  That was done and his medications have been updated per routine protocol.  He was stable enough for discharge and is here for follow-up today.  Discussed full smoking cessation. He is on sig less cigarettes compared to previous.  D/w pt about nicotine gum use.  Routine cautions d/w pt.    No CP like prev but chest wall is sore as expected.  Discussed deep breathing exercises.  No NTG use.   Rationale for current medications discussed with patient.  He is not short of breath.  No lower extremity edema.  Meds, vitals, and allergies reviewed.   ROS: Per HPI unless specifically indicated in ROS section   GEN: nad, alert and oriented HEENT: ncat NECK: supple w/o LA CV: rrr.  PULM: ctab, no inc wob ABD: soft, +bs EXT: no edema SKIN: no acute rash R lower site donor site healing well.   Chest wall healing well.    30 minutes were devoted to patient care in this encounter (this includes time spent reviewing the patient's file/history, interviewing and examining the patient, counseling/reviewing plan with patient).

## 2021-12-07 NOTE — Telephone Encounter (Signed)
Attempted to call patient in regards to Cardiac Rehab - Unable to leave a message mailbox is full.

## 2021-12-08 ENCOUNTER — Encounter (INDEPENDENT_AMBULATORY_CARE_PROVIDER_SITE_OTHER): Payer: HMO | Admitting: Ophthalmology

## 2021-12-08 DIAGNOSIS — H2513 Age-related nuclear cataract, bilateral: Secondary | ICD-10-CM | POA: Diagnosis not present

## 2021-12-08 DIAGNOSIS — D3132 Benign neoplasm of left choroid: Secondary | ICD-10-CM | POA: Diagnosis not present

## 2021-12-08 DIAGNOSIS — E113392 Type 2 diabetes mellitus with moderate nonproliferative diabetic retinopathy without macular edema, left eye: Secondary | ICD-10-CM | POA: Diagnosis not present

## 2021-12-08 DIAGNOSIS — I10 Essential (primary) hypertension: Secondary | ICD-10-CM | POA: Diagnosis not present

## 2021-12-08 DIAGNOSIS — H35033 Hypertensive retinopathy, bilateral: Secondary | ICD-10-CM

## 2021-12-08 DIAGNOSIS — E113311 Type 2 diabetes mellitus with moderate nonproliferative diabetic retinopathy with macular edema, right eye: Secondary | ICD-10-CM

## 2021-12-08 DIAGNOSIS — H43813 Vitreous degeneration, bilateral: Secondary | ICD-10-CM | POA: Diagnosis not present

## 2021-12-09 NOTE — Assessment & Plan Note (Signed)
Rationale for current medications discussed with patient.  Continue aspirin and Plavix.  Continue Lipitor lisinopril metoprolol.  Still okay for outpatient follow-up.  He is going to follow-up with cardiology and cardiothoracic surgery.  Discussed full nicotine cessation.  Routine cautions given to patient.  Okay for outpatient follow-up.  He agrees with plan.  See notes on labs.  Discussed with patient that we would expect postop anemia at baseline.

## 2021-12-10 ENCOUNTER — Other Ambulatory Visit: Payer: Self-pay

## 2021-12-10 ENCOUNTER — Encounter: Payer: Self-pay | Admitting: Cardiology

## 2021-12-10 ENCOUNTER — Ambulatory Visit: Payer: HMO | Admitting: Cardiology

## 2021-12-10 VITALS — BP 132/62 | HR 84 | Temp 98.4°F | Resp 16 | Ht 71.0 in | Wt 228.0 lb

## 2021-12-10 DIAGNOSIS — I2510 Atherosclerotic heart disease of native coronary artery without angina pectoris: Secondary | ICD-10-CM | POA: Diagnosis not present

## 2021-12-10 DIAGNOSIS — Z87891 Personal history of nicotine dependence: Secondary | ICD-10-CM

## 2021-12-10 DIAGNOSIS — I214 Non-ST elevation (NSTEMI) myocardial infarction: Secondary | ICD-10-CM

## 2021-12-10 DIAGNOSIS — Z951 Presence of aortocoronary bypass graft: Secondary | ICD-10-CM | POA: Diagnosis not present

## 2021-12-10 DIAGNOSIS — E139 Other specified diabetes mellitus without complications: Secondary | ICD-10-CM

## 2021-12-10 DIAGNOSIS — I10 Essential (primary) hypertension: Secondary | ICD-10-CM | POA: Diagnosis not present

## 2021-12-10 DIAGNOSIS — I700 Atherosclerosis of aorta: Secondary | ICD-10-CM

## 2021-12-10 NOTE — Progress Notes (Signed)
Date:  12/10/2021   ID:  Stephen Shelton, DOB 07-01-1956, MRN [id number]  PCP:  Tonia Ghent, MD  Cardiologist:  Rex Kras, DO, [facility] (established care 11/24/2021) Cardiothoracic surgeon: Dr. Roxan Hockey   Chief Complaint  Patient presents with   NSTEMI    Hospitalization Follow-up   New Patient (Initial Visit)   Coronary Artery Disease    S/p CABG    HPI  Stephen Shelton is a 66 y.o. 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  He is referred to the office at the request of Tonia Ghent, MD for evaluation of coronary artery disease.  This patient is accompanied in the office by his spouse. [employer] provides verbal consent with regards to having her present during today's encounter.  Originally seen in the hospital on December 21, 2021 when he presents with chest pain and back pain to the ED.  Given the elevated blood pressures and back pain he did undergo CT of the chest which was negative for dissection.  High sensitive troponins continue to elevate and given his symptoms he underwent left heart catheterization and was found to have multivessel CAD.  He was evaluated by cardiothoracic surgery and underwent four-vessel coronary artery bypass surgery with Dr. Roxan Hockey on 11/25/2021.  He has done well postoperatively and was discharged on 11/29/2021.  He presents for follow-up after his recent hospitalization status post CABG.  He states that he is doing well from a cardiovascular standpoint.  Still has some soreness at the sternotomy site.  No exertional or resting anginal discomfort.  His home blood pressures are better controlled with SBP ranging between 120-130  mmHg and DBP ranges between 65-80 mmHg.  Patient has successfully stopped smoking since his recent hospitalization.  He has an upcoming follow-up appointment with cardiothoracic surgery on December 29, 2021.  FUNCTIONAL STATUS: Limited since bypass.    ALLERGIES: Allergies  Allergen Reactions   Metformin And Related Other (See Comments)    abd pain with med use   Sulfa Antibiotics Other (See Comments)    Lightheaded, intolerant, "felt sick"    MEDICATION LIST PRIOR TO VISIT: Current Meds  Medication Sig   aspirin EC 81 MG EC tablet Take 1 tablet (81 mg total) by mouth daily. Swallow whole.   atorvastatin (LIPITOR) 80 MG tablet Take 1 tablet (80 mg total) by mouth daily.   clopidogrel (PLAVIX) 75 MG tablet Take 1 tablet (75 mg total) by mouth daily.   lipase/protease/amylase (CREON) 36000 UNITS CPEP capsule TAKE 1-2 CAPSULES BY MOUTH 3 TIMES DAILY WITH MEALS.MAY ALSO TAKE 1 CAPSULE AS NEEDED W/ SNACKS   lisinopril (ZESTRIL) 5 MG tablet Take 1 tablet (5 mg total) by mouth daily.   Metoprolol Tartrate 37.5 MG TABS Take 37.5 mg by mouth 2 (two) times daily.   Multiple Vitamin (MULTIVITAMIN) tablet Take 1 tablet by mouth daily.   nitroGLYCERIN (NITROSTAT) 0.4 MG SL tablet Place 1 tablet (0.4 mg total) under the tongue every 5 (five) minutes as needed for chest pain.     PAST MEDICAL HISTORY: Past Medical History:  Diagnosis Date   Coronary artery disease    Diabetes mellitus (HCC)    Hyperlipidemia    Hypertension    Septic arthritis (  [place])    R shoulder, history of   Smoker     PAST SURGICAL HISTORY: Past Surgical History:  Procedure Laterality Date   APPENDECTOMY     CORONARY ARTERY BYPASS GRAFT Right 11/25/2021   Procedure: CORONARY ARTERY BYPASS GRAFTING (CABG) X 4  ON PUMP USING LEFT INTERNAL MAMMARY ARTERY AND RIGHT ENDOSCOPIC GREATER SAPHENOUS VEIN CONDUITS;  Surgeon: Melrose Nakayama, MD;  Location: [facility];  Service: Open Heart Surgery;  Laterality: Right;  Right  radial artery harvest   ENDOVEIN HARVEST OF GREATER SAPHENOUS VEIN Right 11/25/2021   Procedure: ENDOVEIN HARVEST OF GREATER SAPHENOUS VEIN;  Surgeon: Melrose Nakayama, MD;  Location: [facility];  Service: Open Heart Surgery;  Laterality: Right;   LEFT HEART CATH AND CORONARY ANGIOGRAPHY N/A 11/24/2021   Procedure: LEFT HEART CATH AND CORONARY ANGIOGRAPHY;  Surgeon: Nigel Mormon, MD;  Location: [facility];  Service: Cardiovascular;  Laterality: N/A;   septic arthritis shoulder      FAMILY HISTORY: The patient family history includes Autoimmune disease in his father; Cancer in his mother; Diabetes in his father and maternal grandfather; Heart attack in his brother.  SOCIAL HISTORY:  The patient  reports that he has been smoking cigarettes. He has a 16.50 pack-year smoking history. He has never used smokeless tobacco. He reports that he does not currently use alcohol. He reports that he does not use drugs.  REVIEW OF SYSTEMS: Review of Systems  Constitutional: Negative for chills and fever.  HENT:  Negative for hoarse voice and nosebleeds.   Eyes:  Negative for discharge, double vision and pain.  Cardiovascular:  Negative for chest pain, claudication, dyspnea on exertion, leg swelling, near-syncope, orthopnea, palpitations, paroxysmal nocturnal dyspnea and syncope.  Respiratory:  Negative for hemoptysis and shortness of breath.   Musculoskeletal:  Negative for muscle cramps and myalgias.  Gastrointestinal:  Negative for abdominal pain, constipation, diarrhea, hematemesis, hematochezia, melena, nausea and vomiting.  Neurological:  Negative for dizziness and light-headedness.   PHYSICAL EXAM: Vitals with BMI 12/10/2021 12/07/2021 11/29/2021  Height _0  _1  -  Weight 228 lbs 229 lbs -  BMI 01.75 10.25 -  Systolic 852 778 242  Diastolic 62 62 84  Pulse 84 69 73    CONSTITUTIONAL: Well-developed and well-nourished. No acute distress.  SKIN: Skin is warm and dry. No rash  noted. No cyanosis. No pallor. No jaundice HEAD: Normocephalic and atraumatic.  EYES: No scleral icterus MOUTH/THROAT: Moist oral membranes.  NECK: No JVD present. No thyromegaly noted. No carotid bruits  LYMPHATIC: No visible cervical adenopathy.  CHEST Normal respiratory effort. No intercostal retractions.  Granulation tissue present over the sternotomy site.  Clean dry and intact. LUNGS: Clear to auscultation bilaterally.  Clear to auscultation bilaterally.  No stridor. No wheezes. No rales.  CARDIOVASCULAR: Regular rate and rhythm, positive S1-S2, no murmurs rubs or gallops appreciated. ABDOMINAL: Obese, soft, nontender, nondistended, positive bowel sounds all 4 quadrants. No apparent ascites.  EXTREMITIES: Trace bilateral peripheral edema, warm to touch, 2+ DP and PT pulses HEMATOLOGIC: No significant bruising NEUROLOGIC: Oriented to person, place, and time. Nonfocal. Normal muscle tone.  PSYCHIATRIC: Normal mood and affect. Normal behavior. Cooperative  CARDIAC DATABASE: EKG: 12/10/2021: NSR, 83 bpm, LAE, poor R wave progression, consider old anterior infarct, diffuse nonspecific T wave abnormality, without underlying injury pattern.   Echocardiogram: 11/24/2021:  1. Left ventricular ejection fraction, by estimation, is 50 to 55%. The  left ventricle has low normal function. The left ventricle has  no regional wall motion abnormalities. There is mild left ventricular hypertrophy.  Left ventricular diastolic parameters are consistent with Grade II diastolic dysfunction (pseudonormalization). Elevated left atrial pressure.   2. Right ventricular systolic function is normal. The right ventricular size is normal.   3. Left atrial size was mildly dilated.   4. The mitral valve is degenerative. Mild mitral valve regurgitation. No evidence of mitral stenosis.   5. The aortic valve is tricuspid. Aortic valve regurgitation is not visualized. Aortic valve sclerosis/calcification is present, without  any evidence of aortic stenosis.    Stress test: None   Heart catheterization: November 24, 2021: LM: Normal LAD: Complex mid LAD/Diag2/septal 90% trifurcation lesion ([place] 1,1,1) Lcx: Prox OM2 80% stenosis RCA: Prox CTO. Epicardial right-to-right and left-to-right collaterals, mostly from LAD septals   LVEDP normal LVEF by LV gram 50-55%.    Continue Aspirin, heparin, statin, blood pressure control CVTS consulted for CABG   Carotid duplex: 11/24/2021: Right Carotid: Velocities in the right ICA are consistent with a 1-39% stenosis.  Left Carotid: Velocities in the left ICA are consistent with a 1-39% stenosis.  Vertebrals:  Bilateral vertebral arteries demonstrate antegrade flow.  Subclavians: Left subclavian artery was stenotic. Normal flow hemodynamics were seen in the right subclavian artery.    Ankle-brachial index: 11/24/2021: Right ABI: Triphasic waveforms observed in PTA and DPA.  Left ABI: Area of stenosis observed at distal PTA with velocities of 323 cm/s.   Upper extremity arterial duplex: 11/24/2021: Right Upper Extremity: Doppler waveforms remain within normal limits with right radial compression. Doppler waveform obliterate with right ulnar compression.  Left Upper Extremity: Doppler waveform obliterate with left radial compression. Doppler waveforms remain within normal limits with left ulnar compression.  LABORATORY DATA: CBC Latest Ref Rng & Units 12/07/2021 11/28/2021 11/27/2021  WBC 4.0 - 10.5 K/uL 13.9(H) 10.8(H) 12.4(H)  Hemoglobin 13.0 - 17.0 g/dL 11.6(L) 10.3(L) 10.4(L)  Hematocrit 39.0 - 52.0 % 35.3(L) 31.1(L) 31.9(L)  Platelets 150.0 - 400.0 K/uL 407.0(H) 134(L) 128(L)    CMP Latest Ref Rng & Units 12/07/2021 11/28/2021 11/27/2021  Glucose 70 - 99 mg/dL 172(H) 108(H) 103(H)  BUN 6 - 23 mg/dL _0 Creatinine 0.40 - 1.50 mg/dL 0.68 0.62 0.59(L)  Sodium 135 - 145 mEq/L 136 132(L) 131(L)  Potassium 3.5 - 5.1 mEq/L 4.6 3.7 4.0  Chloride 96 - 112 mEq/L 101 102  100  CO2 19 - 32 mEq/L _1 Calcium 8.4 - 10.5 mg/dL 9.1 8.5(L) 8.4(L)  Total Protein 6.5 - 8.1 g/dL - - -  Total Bilirubin 0.3 - 1.2 mg/dL - - -  Alkaline Phos 38 - 126 U/L - - -  AST 15 - 41 U/L - - -  ALT 0 - 44 U/L - - -    Lipid Panel  Lab Results  Component Value Date   CHOL 91 06/04/2021   HDL 37.70 (L) 06/04/2021   LDLCALC 42 06/04/2021   TRIG 59.0 06/04/2021   CHOLHDL 2 06/04/2021     No components found for: NTPROBNP No results for input(s): PROBNP in the last 8760 hours. Recent Labs    11/24/21 0906  TSH 0.871    BMP Recent Labs    11/26/21 1724 11/27/21 0333 11/28/21 0118 12/07/21 1217  NA 131* 131* 132* 136  K 4.1 4.0 3.7 4.6  CL 101 100 102 101  CO2 _2 GLUCOSE 171* 103* 108* 172*  BUN _3 CREATININE 0.80 0.59*  0.62 0.68  CALCIUM 8.7* 8.4* 8.5* 9.1  GFRNONAA >60 >60 >60  --     HEMOGLOBIN A1C Lab Results  Component Value Date   HGBA1C 7.1 (H) 11/24/2021   MPG 157.07 11/24/2021    IMPRESSION:    ICD-10-CM   1. Coronary artery disease involving native coronary artery of native heart without angina pectoris  I25.10 Lipid Panel With LDL/HDL Ratio    LDL cholesterol, direct    CMP14+EGFR    Pro b natriuretic peptide (BNP)    2. Non-ST elevation (NSTEMI) myocardial infarction (HCC)  I21.4 EKG 12-Lead    3. Hx of CABG  Z95.1 Lipid Panel With LDL/HDL Ratio    LDL cholesterol, direct    CMP14+EGFR    Pro b natriuretic peptide (BNP)    4. 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 Benign hypertension  I10     6. Aortic atherosclerosis (HCC)  I70.0     7. Former smoker  Z87.891        RECOMMENDATIONS: Stephen Shelton is a 48 y.o. 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  Coronary artery disease involving native coronary artery of native heart without angina pectoris /status post four-vessel bypass: Presents with non-STEMI November 24, 2021, follow-up heart catheterization notes multivessel CAD in the setting of diabetes was referred to cardiothoracic surgery for surgical revascularization. Patient underwent four-vessel bypass on 11/25/2020 (LIMA to LAD, SVG to diagonal, SVG to OM, SVG to PDA) Later discharged on 11/29/2021 and now presents for office visit. He is asymptomatic with regards to the angina pectoris or heart failure symptoms. Medications reconciled. Surgical scars are healing well without signs of infection. Home blood pressures are well controlled. Most recent hemoglobin A1c reviewed, will follow up with endocrinology in the near future. Already has had a follow-up outpatient visit with PCP. Continue dual antiplatelet therapy in the setting of recent non-STEMI. Continue high intensity statin therapy. He has successfully quit smoking since his recent hospitalization Educated on the importance of improving his modifiable cardiovascular risk factors. Has an appointment with cardiothoracic surgery on December 29, 2021 with hopes of being released from cardiac rehab. Continue to monitor.  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 Currently not on medical therapy. States that he follows up with his endocrinologist in the near future.  Benign hypertension Repeat blood pressure at the office improved significantly. Home blood pressures are well controlled. Medications reconciled. Reemphasized importance of a low-salt diet.  Aortic atherosclerosis ([place]) Continue with aspirin and statin therapy.  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 He used to smoke 1 pack/day equaling approximately 30-year pack history of  smoking.  FINAL MEDICATION LIST END OF ENCOUNTER: No orders of the defined types were placed in this encounter.   There are no discontinued medications.   Current Outpatient Medications:    aspirin EC 81 MG EC tablet, Take 1 tablet (81 mg total) by mouth daily. Swallow whole., Disp: 30 tablet, Rfl: 11   atorvastatin (LIPITOR) 80 MG tablet, Take 1 tablet (80 mg total) by mouth daily., Disp: 30 tablet, Rfl: 0   clopidogrel (PLAVIX) 75 MG tablet, Take 1 tablet (75 mg total) by mouth daily., Disp: 30 tablet, Rfl:  1   lipase/protease/amylase (CREON) 36000 UNITS CPEP capsule, TAKE 1-2 CAPSULES BY MOUTH 3 TIMES DAILY WITH MEALS.MAY ALSO TAKE 1 CAPSULE AS NEEDED W/ SNACKS, Disp: 240 capsule, Rfl: 3   lisinopril (ZESTRIL) 5 MG tablet, Take 1 tablet (5 mg total) by mouth daily., Disp: 90 tablet, Rfl: 3   Metoprolol Tartrate 37.5 MG TABS, Take 37.5 mg by mouth 2 (two) times daily., Disp: 60 tablet, Rfl: 1   Multiple Vitamin (MULTIVITAMIN) tablet, Take 1 tablet by mouth daily., Disp: , Rfl:    nitroGLYCERIN (NITROSTAT) 0.4 MG SL tablet, Place 1 tablet (0.4 mg total) under the tongue every 5 (five) minutes as needed for chest pain., Disp: 50 tablet, Rfl: 3  Orders Placed This Encounter  Procedures   Lipid Panel With LDL/HDL Ratio   LDL cholesterol, direct   CMP14+EGFR   Pro b natriuretic peptide (BNP)   EKG 12-Lead   There are no Patient Instructions on file for this visit.   --Continue cardiac medications as reconciled in final medication list. --Return in about 3 months (around 03/10/2022) for Follow up, CAD, CABG Jan 2023. Or sooner if needed. --Continue follow-up with your primary care physician regarding the management of your other chronic comorbid conditions.  Patient's questions and concerns were addressed to his satisfaction. He voices understanding of the instructions provided during this encounter.   This note was created using a voice recognition software as a result there may be  grammatical errors inadvertently enclosed that do not reflect the nature of this encounter. Every attempt is made to correct such errors.  Rex Kras, [place], [facility]  Pager: [phone] Office: [phone]

## 2021-12-17 ENCOUNTER — Encounter: Payer: Self-pay | Admitting: Family Medicine

## 2021-12-19 ENCOUNTER — Other Ambulatory Visit: Payer: Self-pay | Admitting: Physician Assistant

## 2021-12-20 ENCOUNTER — Other Ambulatory Visit: Payer: Self-pay | Admitting: Physician Assistant

## 2021-12-21 ENCOUNTER — Other Ambulatory Visit: Payer: Self-pay | Admitting: Physician Assistant

## 2021-12-28 ENCOUNTER — Other Ambulatory Visit: Payer: Self-pay | Admitting: Thoracic Surgery (Cardiothoracic Vascular Surgery)

## 2021-12-28 DIAGNOSIS — Z951 Presence of aortocoronary bypass graft: Secondary | ICD-10-CM

## 2021-12-29 ENCOUNTER — Ambulatory Visit: Payer: HMO | Admitting: Thoracic Surgery (Cardiothoracic Vascular Surgery)

## 2021-12-29 ENCOUNTER — Ambulatory Visit (INDEPENDENT_AMBULATORY_CARE_PROVIDER_SITE_OTHER): Payer: Self-pay | Admitting: Surgical

## 2021-12-29 ENCOUNTER — Other Ambulatory Visit: Payer: Self-pay

## 2021-12-29 ENCOUNTER — Ambulatory Visit
Admission: RE | Admit: 2021-12-29 | Discharge: 2021-12-29 | Disposition: A | Discharge status: Home / Self Care | Payer: HMO | Source: Ambulatory Visit | Attending: Thoracic Surgery (Cardiothoracic Vascular Surgery) | Admitting: Thoracic Surgery (Cardiothoracic Vascular Surgery)

## 2021-12-29 VITALS — BP 136/76 | HR 96 | Resp 20 | Ht 71.0 in | Wt 225.0 lb

## 2021-12-29 DIAGNOSIS — Z951 Presence of aortocoronary bypass graft: Secondary | ICD-10-CM

## 2021-12-29 DIAGNOSIS — R918 Other nonspecific abnormal finding of lung field: Secondary | ICD-10-CM | POA: Diagnosis not present

## 2021-12-29 DIAGNOSIS — J90 Pleural effusion, not elsewhere classified: Secondary | ICD-10-CM | POA: Diagnosis not present

## 2021-12-29 MED ORDER — ATORVASTATIN CALCIUM 80 MG PO TABS: 80.0000 mg | ORAL_TABLET | Freq: Every day | ORAL | 1 refills | Status: DC

## 2021-12-29 MED ORDER — POTASSIUM CHLORIDE CRYS ER 20 MEQ PO TBCR: 20.0000 meq | EXTENDED_RELEASE_TABLET | Freq: Every day | ORAL | 0 refills | Status: DC

## 2021-12-29 MED ORDER — FUROSEMIDE 40 MG PO TABS: 40.0000 mg | ORAL_TABLET | Freq: Every day | ORAL | 0 refills | Status: DC

## 2021-12-29 NOTE — Progress Notes (Signed)
[address]       [place], [address]             [phone]      Lajuan N Tolson [facility]ley Medical Record #[id number] Date of Birth: Aug 23, 1956  Referring: Rex Kras, DO Primary Care: Tonia Ghent, MD Primary Cardiologist: None   Chief Complaint:   POST OP FOLLOW UP DATE OF PROCEDURE:  11/25/2021.   PREOPERATIVE DIAGNOSIS:  Three-vessel coronary artery disease, status post non-ST elevation myocardial infarction.   POSTOPERATIVE DIAGNOSIS:  Three-vessel coronary artery disease, status post ST elevation myocardial infarction.   PROCEDURE PERFORMED:   Median sternotomy, extracorporeal circulation,  Coronary artery bypass grafting x 4  Left internal mammary artery to LAD,  Saphenous vein graft to first diagonal,  Saphenous vein graft to the obtuse marginal 1, Saphenous vein graft to posterior descending  Endoscopic vein harvest right leg.   SURGEON: Modesto Charon, MD.   ASSISTANT: Lars Pinks, PA.-C   ANESTHESIA: General.   History of Present Illness:    Patient is a 66 year old male seen in the office on today's date and routine postsurgical follow-up following the above described procedure.  He reports that he is feeling quite well.  He does have some minor sternal incisional discomfort but is not taking any pain medications at this point.  He has not had any difficulty with the incisions.  He is not having any chest pain, shortness of breath or anginal equivalents with routine activity.  He denies fevers, chills or other significant constitutional symptoms.  He does have some times where he is more energetic than others and tires somewhat easily.  He has not had any palpitations or lower extremity edema.  Overall he and his wife are pleased with progress.      Past Medical History:  Diagnosis Date   Coronary artery disease    Diabetes mellitus ([place])    Hyperlipidemia    Hypertension    Septic arthritis (HCC)    R  shoulder, history of   Smoker      Social History   Tobacco Use  Smoking Status Former   Packs/day: 0.50   Years: 33.00   Pack years: 16.50   Types: Cigarettes   Quit date: 11/25/2021   Years since quitting: 0.0  Smokeless Tobacco Never  Tobacco Comments   "had a few since surgery"    Social History   Substance and Sexual Activity  Alcohol Use Not Currently   Comment: rarely     Allergies  Allergen Reactions   Metformin And Related Other (See Comments)    abd pain with med use   Sulfa Antibiotics Other (See Comments)    Lightheaded, intolerant, "felt sick"    Current Outpatient Medications  Medication Sig Dispense Refill   aspirin EC 81 MG EC tablet Take 1 tablet (81 mg total) by mouth daily. Swallow whole. 30 tablet 11   atorvastatin (LIPITOR) 80 MG tablet Take 1 tablet (80 mg total) by mouth daily. 30 tablet 0   clopidogrel (PLAVIX) 75 MG tablet Take 1 tablet (75 mg total) by mouth daily. 30 tablet 1   lipase/protease/amylase (CREON) 36000 UNITS CPEP capsule TAKE 1-2 CAPSULES BY MOUTH 3 TIMES DAILY WITH MEALS.MAY ALSO TAKE 1 CAPSULE AS NEEDED W/ SNACKS 240 capsule 3   lisinopril (ZESTRIL) 5 MG tablet Take 1 tablet (5 mg total) by mouth daily. 90 tablet 3   Metoprolol Tartrate 37.5 MG TABS Take 37.5 mg by mouth  2 (two) times daily. 60 tablet 1   Multiple Vitamin (MULTIVITAMIN) tablet Take 1 tablet by mouth daily.     nitroGLYCERIN (NITROSTAT) 0.4 MG SL tablet Place 1 tablet (0.4 mg total) under the tongue every 5 (five) minutes as needed for chest pain. 50 tablet 3   No current facility-administered medications for this visit.       Physical Exam: BP 136/76 (BP Location: Right Arm, Patient Position: Sitting, Cuff Size: Normal)    Pulse 96    Resp 20    Ht 5\' 11"  (1.803 m)    Wt 225 lb (102.1 kg)    SpO2 95% Comment: RA   BMI 31.38 kg/m   General appearance: alert, cooperative, and no distress Heart: regular rate and rhythm Lungs: Minimally diminished in the  bases Abdomen: Benign exam Extremities: Trace edema Wound: Incisions well-healed without evidence of infection   Diagnostic Studies & Laboratory data:     Recent Radiology Findings:   DG Chest 2 View  Result Date: 12/29/2021 CLINICAL DATA:  s/p cabg x 4 EXAM: CHEST - 2 VIEW COMPARISON:  Radiograph 11/28/2021, chest CT 11/24/2021 FINDINGS: Unchanged cardiomediastinal silhouette with prior median sternotomy and CABG. There are moderate size bilateral pleural effusions with adjacent basilar opacities, likely atelectasis. No visible pneumothorax. Severe right glenohumeral osteoarthritis. No acute osseous abnormality. IMPRESSION: Moderate sized bilateral pleural effusions with adjacent basilar consolidations, likely atelectasis. Electronically Signed   By: Maurine Simmering M.D.   On: 12/29/2021 13:08      Recent Lab Findings: Lab Results  Component Value Date   WBC 13.9 (H) 12/07/2021   HGB 11.6 (L) 12/07/2021   HCT 35.3 (L) 12/07/2021   PLT 407.0 (H) 12/07/2021   GLUCOSE 172 (H) 12/07/2021   CHOL 91 06/04/2021   TRIG 59.0 06/04/2021   HDL 37.70 (L) 06/04/2021   LDLCALC 42 06/04/2021   ALT 21 11/23/2021   AST 29 11/23/2021   NA 136 12/07/2021   K 4.6 12/07/2021   CL 101 12/07/2021   CREATININE 0.68 12/07/2021   BUN 14 12/07/2021   CO2 28 12/07/2021   TSH 0.871 11/24/2021   INR 1.2 11/25/2021   HGBA1C 7.1 (H) 11/24/2021      Assessment / Plan: Doing very well following his surgical revascularization.  I did give him a week of Lasix and potassium 40/20 to hopefully assist with management of the small pleural effusions.  I also renewed his Lipitor prescription as he was running out.  He will continue to follow-up with cardiology per their routine.  We discussed activity progression including lifting instructions and driving protocols.  We will see the patient again on a as needed basis for any surgically related needs or at her request.      Medication Changes: No orders of the  defined types were placed in this encounter.     John Giovanni, PA-C  12/29/2021 1:17 PM

## 2021-12-29 NOTE — Patient Instructions (Signed)
Discussed activity progression including driving and lifting

## 2022-01-09 ENCOUNTER — Other Ambulatory Visit: Payer: Self-pay | Admitting: Physician Assistant

## 2022-01-10 NOTE — Telephone Encounter (Signed)
Refill Plavix and metoprolol

## 2022-01-20 ENCOUNTER — Encounter (INDEPENDENT_AMBULATORY_CARE_PROVIDER_SITE_OTHER): Payer: HMO | Admitting: Ophthalmology

## 2022-01-20 ENCOUNTER — Other Ambulatory Visit: Payer: Self-pay | Admitting: Surgical

## 2022-01-20 ENCOUNTER — Other Ambulatory Visit: Payer: Self-pay

## 2022-01-20 DIAGNOSIS — H43813 Vitreous degeneration, bilateral: Secondary | ICD-10-CM | POA: Diagnosis not present

## 2022-01-20 DIAGNOSIS — D3132 Benign neoplasm of left choroid: Secondary | ICD-10-CM | POA: Diagnosis not present

## 2022-01-20 DIAGNOSIS — E113392 Type 2 diabetes mellitus with moderate nonproliferative diabetic retinopathy without macular edema, left eye: Secondary | ICD-10-CM | POA: Diagnosis not present

## 2022-01-20 DIAGNOSIS — H2513 Age-related nuclear cataract, bilateral: Secondary | ICD-10-CM

## 2022-01-20 DIAGNOSIS — I10 Essential (primary) hypertension: Secondary | ICD-10-CM

## 2022-01-20 DIAGNOSIS — E113311 Type 2 diabetes mellitus with moderate nonproliferative diabetic retinopathy with macular edema, right eye: Secondary | ICD-10-CM

## 2022-01-20 DIAGNOSIS — H35033 Hypertensive retinopathy, bilateral: Secondary | ICD-10-CM

## 2022-02-03 ENCOUNTER — Encounter (HOSPITAL_COMMUNITY)
Admission: RE | Admit: 2022-02-03 | Discharge: 2022-02-03 | Disposition: A | Discharge status: Home / Self Care | Payer: HMO | Source: Ambulatory Visit | Attending: Cardiology | Admitting: Cardiology

## 2022-02-03 ENCOUNTER — Other Ambulatory Visit: Payer: Self-pay

## 2022-02-03 NOTE — Progress Notes (Signed)
Incomplete Session Note ? ?Patient Details  ?Name: Stephen Shelton ?MRN: [id number] ?Date of Birth: [birth year] ?Referring Provider:   ? ?[employer] did not complete his rehab session.  Pt arrived for his orientation at 1230. Pt decided that he did not need CR, as he is already very active and has not had complications since his CABG. 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 I did discuss diet with him and gave him 2 heart healthy packets along with a diabetic packet. We also discussed when he can return to playing golf. I advised him to wait until he sees Dr. Terri Skains in April, and to ask again during that visit. I advised pt that his referral will be good for up to a year after his NSTEMI, and that should call us to be rescheduled if he changes his mind about rehab. I will close his referral.  ?

## 2022-02-09 ENCOUNTER — Other Ambulatory Visit: Payer: Self-pay | Admitting: Thoracic Surgery (Cardiothoracic Vascular Surgery)

## 2022-02-19 ENCOUNTER — Other Ambulatory Visit: Payer: Self-pay | Admitting: Surgical

## 2022-02-22 ENCOUNTER — Encounter: Payer: Self-pay | Admitting: Internal Medicine

## 2022-02-22 ENCOUNTER — Ambulatory Visit (INDEPENDENT_AMBULATORY_CARE_PROVIDER_SITE_OTHER): Payer: HMO | Admitting: Internal Medicine

## 2022-02-22 VITALS — BP 124/80 | HR 62 | Ht 71.0 in | Wt 219.0 lb

## 2022-02-22 DIAGNOSIS — E10319 Type 1 diabetes mellitus with unspecified diabetic retinopathy without macular edema: Secondary | ICD-10-CM | POA: Diagnosis not present

## 2022-02-22 DIAGNOSIS — K8689 Other specified diseases of pancreas: Secondary | ICD-10-CM | POA: Diagnosis not present

## 2022-02-22 DIAGNOSIS — E1059 Type 1 diabetes mellitus with other circulatory complications: Secondary | ICD-10-CM | POA: Diagnosis not present

## 2022-02-22 DIAGNOSIS — E1042 Type 1 diabetes mellitus with diabetic polyneuropathy: Secondary | ICD-10-CM | POA: Diagnosis not present

## 2022-02-22 DIAGNOSIS — R739 Hyperglycemia, unspecified: Secondary | ICD-10-CM | POA: Diagnosis not present

## 2022-02-22 DIAGNOSIS — E139 Other specified diabetes mellitus without complications: Secondary | ICD-10-CM

## 2022-02-22 DIAGNOSIS — E109 Type 1 diabetes mellitus without complications: Secondary | ICD-10-CM | POA: Insufficient documentation

## 2022-02-22 LAB — POCT GLYCOSYLATED HEMOGLOBIN (HGB A1C): Hemoglobin A1C: 7.2 % — AB (ref 4.0–5.6)

## 2022-02-22 NOTE — Progress Notes (Signed)
?Name: Stephen Shelton  ?Age/ Sex: 66 y.o., male   ?MRN/ DOB: [id number], 1956-11-13    ? ?PCP: Tonia Ghent, MD   ?Reason for Endocrinology Evaluation: Type 2 Diabetes Mellitus  ?Initial Endocrine Consultative Visit: 08/27/2020  ? ? ?PATIENT IDENTIFIER: Mr. Stephen Shelton is a 66 y.o. male with a past medical history of T2DM, HTN, CAD (S/P CABG). The patient has followed with Endocrinology clinic since 08/27/2020 for consultative assistance with management of his diabetes. ? ?DIABETIC HISTORY:  ?Mr. Mangieri was diagnosed with T2DM in 2004, but this was changed to LADA by 06/2020 after he was found to have an elevated GAD-65 at 89.7 U/mL .  ? ?He was also checked for C-Peptide in 06/2020 at 2.6 ng/mL with serum glucose 121 mg/dL  ? ? ?He was initially on Metformin but this  caused Abdominal pain. His hemoglobin A1c has ranged from 5.9% in 2021, peaking at 10.9% in 2019. ? ? ?He was on insulin from 12/2017 until 12/2019, he was taken off insulin by  [facility] health program  ( which is a keto diet) and after losing   30 lbs .  ?  ?He is on  Creon for pancreatic insufficiency , has been having loose stools since 2019 years which has helped. Celiac test negative.  ?  ?Maternal Grandfather with DM ?Father with DM  ?Daughter with T1DM and thyroid disease  ?SUBJECTIVE:  ? ?During the last visit (10/26/2021): A1c 6.9 % No changes were made.  ? ? ? ?Today (02/22/2022): [name] is here for a follow up on diabetes care.  He checks his blood sugars 2 times daily. The patient has  had  not hypoglycemic episodes since the last clinic visit  ? ? ?He is s/p CABG 11/2021 after he presented to the ED for non-ST elevation MI ?Has chronic loose stools, denies nausea or vomiting  ? ? ? ?HOME DIABETES REGIMEN:  ?N/A ? ?Statin: yes ?ACE-I/ARB: yes ? ? ? ?METER DOWNLOAD SUMMARY:  ? ?DIABETIC COMPLICATIONS: ?Microvascular complications:  ?S/P laser treatment for DR ?Denies: CKD,  neuropathy  ?Last Eye Exam: Completed 2022 ? ?Macrovascular  complications:  ?Non-ST elevation MI, S/P CABG ( 11/2021) ?Denies: CVA, PVD ? ? ?HISTORY:  ?Past Medical History:  ?Past Medical History:  ?Diagnosis Date  ? Coronary artery disease   ? Diabetes mellitus ([place])   ? Hyperlipidemia   ? Hypertension   ? Septic arthritis ([place])   ? R shoulder, history of  ? Smoker   ? ?Past Surgical History:  ?Past Surgical History:  ?Procedure Laterality Date  ? APPENDECTOMY    ? CORONARY ARTERY BYPASS GRAFT Right 11/25/2021  ? Procedure: CORONARY ARTERY BYPASS GRAFTING (CABG) X 4  ON PUMP USING LEFT INTERNAL MAMMARY ARTERY AND RIGHT ENDOSCOPIC GREATER SAPHENOUS VEIN CONDUITS;  Surgeon: Melrose Nakayama, MD;  Location: [place];  Service: Open Heart Surgery;  Laterality: Right;  Right radial artery harvest  ? ENDOVEIN HARVEST OF GREATER SAPHENOUS VEIN Right 11/25/2021  ? Procedure: ENDOVEIN HARVEST OF GREATER SAPHENOUS VEIN;  Surgeon: Melrose Nakayama, MD;  Location: [place];  Service: Open Heart Surgery;  Laterality: Right;  ? LEFT HEART CATH AND CORONARY ANGIOGRAPHY N/A 11/24/2021  ? Procedure: LEFT HEART CATH AND CORONARY ANGIOGRAPHY;  Surgeon: Nigel Mormon, MD;  Location: [place] [facility];  Service: Cardiovascular;  Laterality: N/A;  ? septic arthritis shoulder    ? ?Social History:  reports that he quit smoking about 2 months ago. His smoking  use included cigarettes. He has a 16.50 pack-year smoking history. He has never used smokeless tobacco. He reports that he does not currently use alcohol. He reports that he does not use drugs. ?Family History:  ?Family History  ?Problem Relation Age of Onset  ? Cancer Mother   ?     multiple myeloma  ? Diabetes Father   ? Autoimmune disease Father   ?     liver and renal disease  ? Heart attack Brother   ? Diabetes Maternal Grandfather   ? Colon cancer Neg Hx   ? Prostate cancer Neg Hx   ? ? ? ?HOME MEDICATIONS: ?Allergies as of 02/22/2022   ? ?   Reactions  ? Metformin And Related Other (See Comments)  ? abd pain with med use  ? Sulfa  Antibiotics Other (See Comments)  ? Lightheaded, intolerant, "felt sick"  ? ?  ? ?  ?Medication List  ?  ? ?  ? Accurate as of February 22, 2022  7:45 AM. If you have any questions, ask your nurse or doctor.  ?  ?  ? ?  ? ?STOP taking these medications   ? ?potassium chloride SA 20 MEQ tablet ?Commonly known as: KLOR-CON M ?Stopped by: Dorita Sciara, MD ?  ? ?  ? ?TAKE these medications   ? ?aspirin 81 MG EC tablet ?Take 1 tablet (81 mg total) by mouth daily. Swallow whole. ?  ?atorvastatin 80 MG tablet ?Commonly known as: LIPITOR ?Take 1 tablet (80 mg total) by mouth daily. ?  ?clopidogrel 75 MG tablet ?Commonly known as: PLAVIX ?TAKE 1 TABLET BY MOUTH EVERY DAY ?  ?furosemide 40 MG tablet ?Commonly known as: Lasix ?Take 1 tablet (40 mg total) by mouth daily. ?  ?lipase/protease/amylase 36000 UNITS Cpep capsule ?Commonly known as: Creon ?TAKE 1-2 CAPSULES BY MOUTH 3 TIMES DAILY WITH MEALS.MAY ALSO TAKE 1 CAPSULE AS NEEDED W/ SNACKS ?  ?lisinopril 5 MG tablet ?Commonly known as: ZESTRIL ?Take 1 tablet (5 mg total) by mouth daily. ?  ?Metoprolol Tartrate 37.5 MG Tabs ?TAKE 37.5 MG BY MOUTH 2 (TWO) TIMES DAILY. ?  ?multivitamin tablet ?Take 1 tablet by mouth daily. ?  ?nitroGLYCERIN 0.4 MG SL tablet ?Commonly known as: NITROSTAT ?Place 1 tablet (0.4 mg total) under the tongue every 5 (five) minutes as needed for chest pain. ?  ? ?  ? ? ? ?OBJECTIVE:  ? ?Vital Signs: BP 124/80 (BP Location: Left Arm, Patient Position: Sitting, Cuff Size: Small)   Pulse 62   Ht '5\' 11"'  (1.803 m)   Wt 219 lb (99.3 kg)   SpO2 97%   BMI 30.54 kg/m?   ?Wt Readings from Last 3 Encounters:  ?02/22/22 219 lb (99.3 kg)  ?12/29/21 225 lb (102.1 kg)  ?12/10/21 228 lb (103.4 kg)  ? ? ? ?Exam: ?General: Pt appears well and is in NAD  ?Lungs: Clear with good BS bilat  ?Heart: RRR   ?Abdomen: Normoactive bowel sounds, soft, nontender, without masses or organomegaly palpable  ?Extremities: No pretibial edema.  ?Neuro: MS is good with  appropriate affect, pt is alert and Ox3  ? ?DM foot exam: 10/26/2021 ?  ?The skin of the feet is intact without sores or ulcerations. ?The pedal pulses are 1+ on right and 1+ on left. ?The sensation is intact  to a screening 5.07, 10 gram monofilament on the right  ? ? ? ?DATA REVIEWED: ? ? ?Results for Mahoney, ERLAND VIVAS "[place]" (MRN [id number]) as of  06/25/2021 10:08 ? Ref. Range 06/24/2021 09:01  ?Glucose, Plasma Latest Ref Range: 65 - 139 mg/dL 134  ? ?C-peptide 1.90 ng/mL  ? ? ? ?Lab Results  ?Component Value Date  ? HGBA1C 7.1 (H) 11/24/2021  ? HGBA1C 6.9 (A) 10/26/2021  ? HGBA1C 6.9 (H) 06/04/2021  ? ?Lab Results  ?Component Value Date  ? MICROALBUR 3.6 (H) 06/04/2021  ? [place] 42 06/04/2021  ? CREATININE 0.68 12/07/2021  ? ?Lab Results  ?Component Value Date  ? MICRALBCREAT 2.0 06/04/2021  ? ? ? ?Lab Results  ?Component Value Date  ? CHOL 91 06/04/2021  ? HDL 37.70 (L) 06/04/2021  ? [place] 42 06/04/2021  ? TRIG 59.0 06/04/2021  ? CHOLHDL 2 06/04/2021  ?     ?07/07/2020 ?  ?Gluc 121 ?BUN/Cr 14/0.64 ?GFR 103 ?A1c 5.8%  ?C- peptide 2.6 ng/mL ?GAD-65 89.7 U/mL  ? ? ? ?12/16/2020 ?A1c 6.1 % ? ?Gluc 128 mg/dL  ?BUN/Cr 18/0.76  ?GFR 96  ?LDL 89 mg/dL ?Tg 69  ?Alb/CR ratio 44  ? ?ASSESSMENT / PLAN / RECOMMENDATIONS:  ? 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 Goal A1c < 7.0 %. ? ?- His A1c has gradually been trending up, part of hyperglycemia he attributes to the recent CABG ?- He understands that eventually he will need insulin ?- He will not be a good candidate for GLP-1 agonists due to pancreatic insufficiency nor for SGLT-2 inhibitors due to increased risk of DKA in the setting of autoimmune diabetes mellitus. ?-He would like to avoid metformin ?-We briefly discussed sulfonylureas, we discussed risk of hypoglycemia and weight gain, we have opted to hold off on this for now but will be  an option in the future if needed ? ? ?MEDICATIONS: ?N/A ? ?EDUCATION / INSTRUCTIONS: ?BG monitoring instructions: Patient is instructed to check his blood sugars 3 times a day, before meals . ?Call [facility] Endocrinology clin

## 2022-02-27 ENCOUNTER — Other Ambulatory Visit: Payer: Self-pay | Admitting: Surgical

## 2022-03-01 ENCOUNTER — Other Ambulatory Visit: Payer: Self-pay | Admitting: Surgical

## 2022-03-02 ENCOUNTER — Encounter: Payer: Self-pay | Admitting: Family Medicine

## 2022-03-02 ENCOUNTER — Other Ambulatory Visit: Payer: Self-pay | Admitting: Cardiology

## 2022-03-03 ENCOUNTER — Telehealth: Payer: Self-pay | Admitting: Cardiology

## 2022-03-03 NOTE — Telephone Encounter (Signed)
Patient requesting refill for atorvastatin. Says he's called a few times and hasn't heard back. Please call him back at [phone]. ?

## 2022-03-04 ENCOUNTER — Other Ambulatory Visit: Payer: Self-pay

## 2022-03-04 ENCOUNTER — Other Ambulatory Visit: Payer: Self-pay | Admitting: Family Medicine

## 2022-03-04 DIAGNOSIS — Z951 Presence of aortocoronary bypass graft: Secondary | ICD-10-CM

## 2022-03-04 DIAGNOSIS — I2510 Atherosclerotic heart disease of native coronary artery without angina pectoris: Secondary | ICD-10-CM

## 2022-03-04 IMAGING — DX DG CHEST 1V PORT
1 series · 1 of 1 positions shown · non-contrast
Comparison: Previous studies including the examination of
11/26/2021.

CLINICAL DATA: Status post cardiac surgery, difficulty breathing

EXAM:
PORTABLE CHEST 1 VIEW

[chest ap]
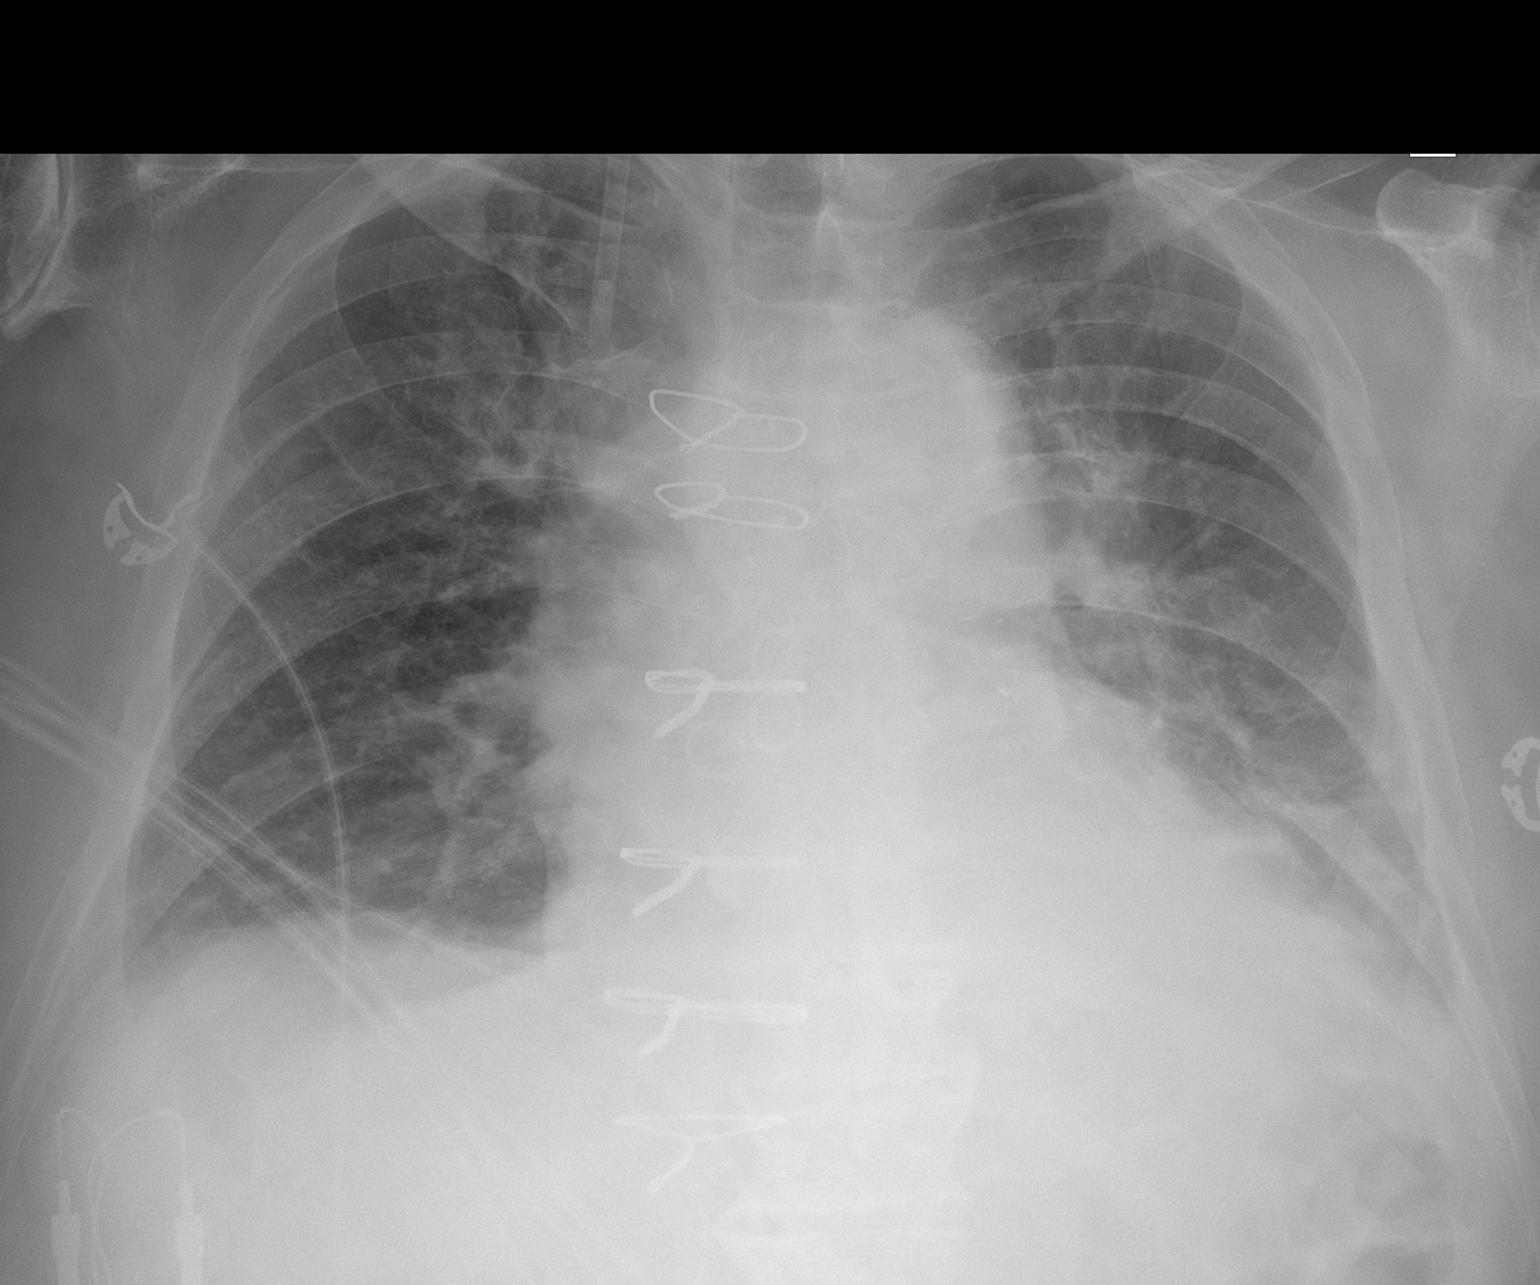

[1 of 1 positions shown; findings below may reference images not displayed]

FINDINGS: Transverse diameter of heart is increased. There is interval
decrease in pulmonary vascular congestion. There is interval removal
of mediastinal drain and left chest tube. There is interval removal
of Swan-Ganz catheter. Right IJ vascular sheath is still noted in
place. There is blunting of lateral CP angles. There is no
pneumothorax.
IMPRESSION: There is interval decrease in pulmonary vascular congestion and
pulmonary edema. There are no new focal infiltrates. Small bilateral
pleural effusions.

## 2022-03-04 MED ORDER — ATORVASTATIN CALCIUM 80 MG PO TABS: 80.0000 mg | ORAL_TABLET | Freq: Every day | ORAL | 1 refills | Status: DC

## 2022-03-04 NOTE — Telephone Encounter (Signed)
Ok to refill medication under your name

## 2022-03-05 IMAGING — DX DG CHEST 2V
2 series · 2 of 2 positions shown · non-contrast
Comparison: 11/27/2021

CLINICAL DATA: Status post CABG.

EXAM:
CHEST - 2 VIEW

[chest lat]
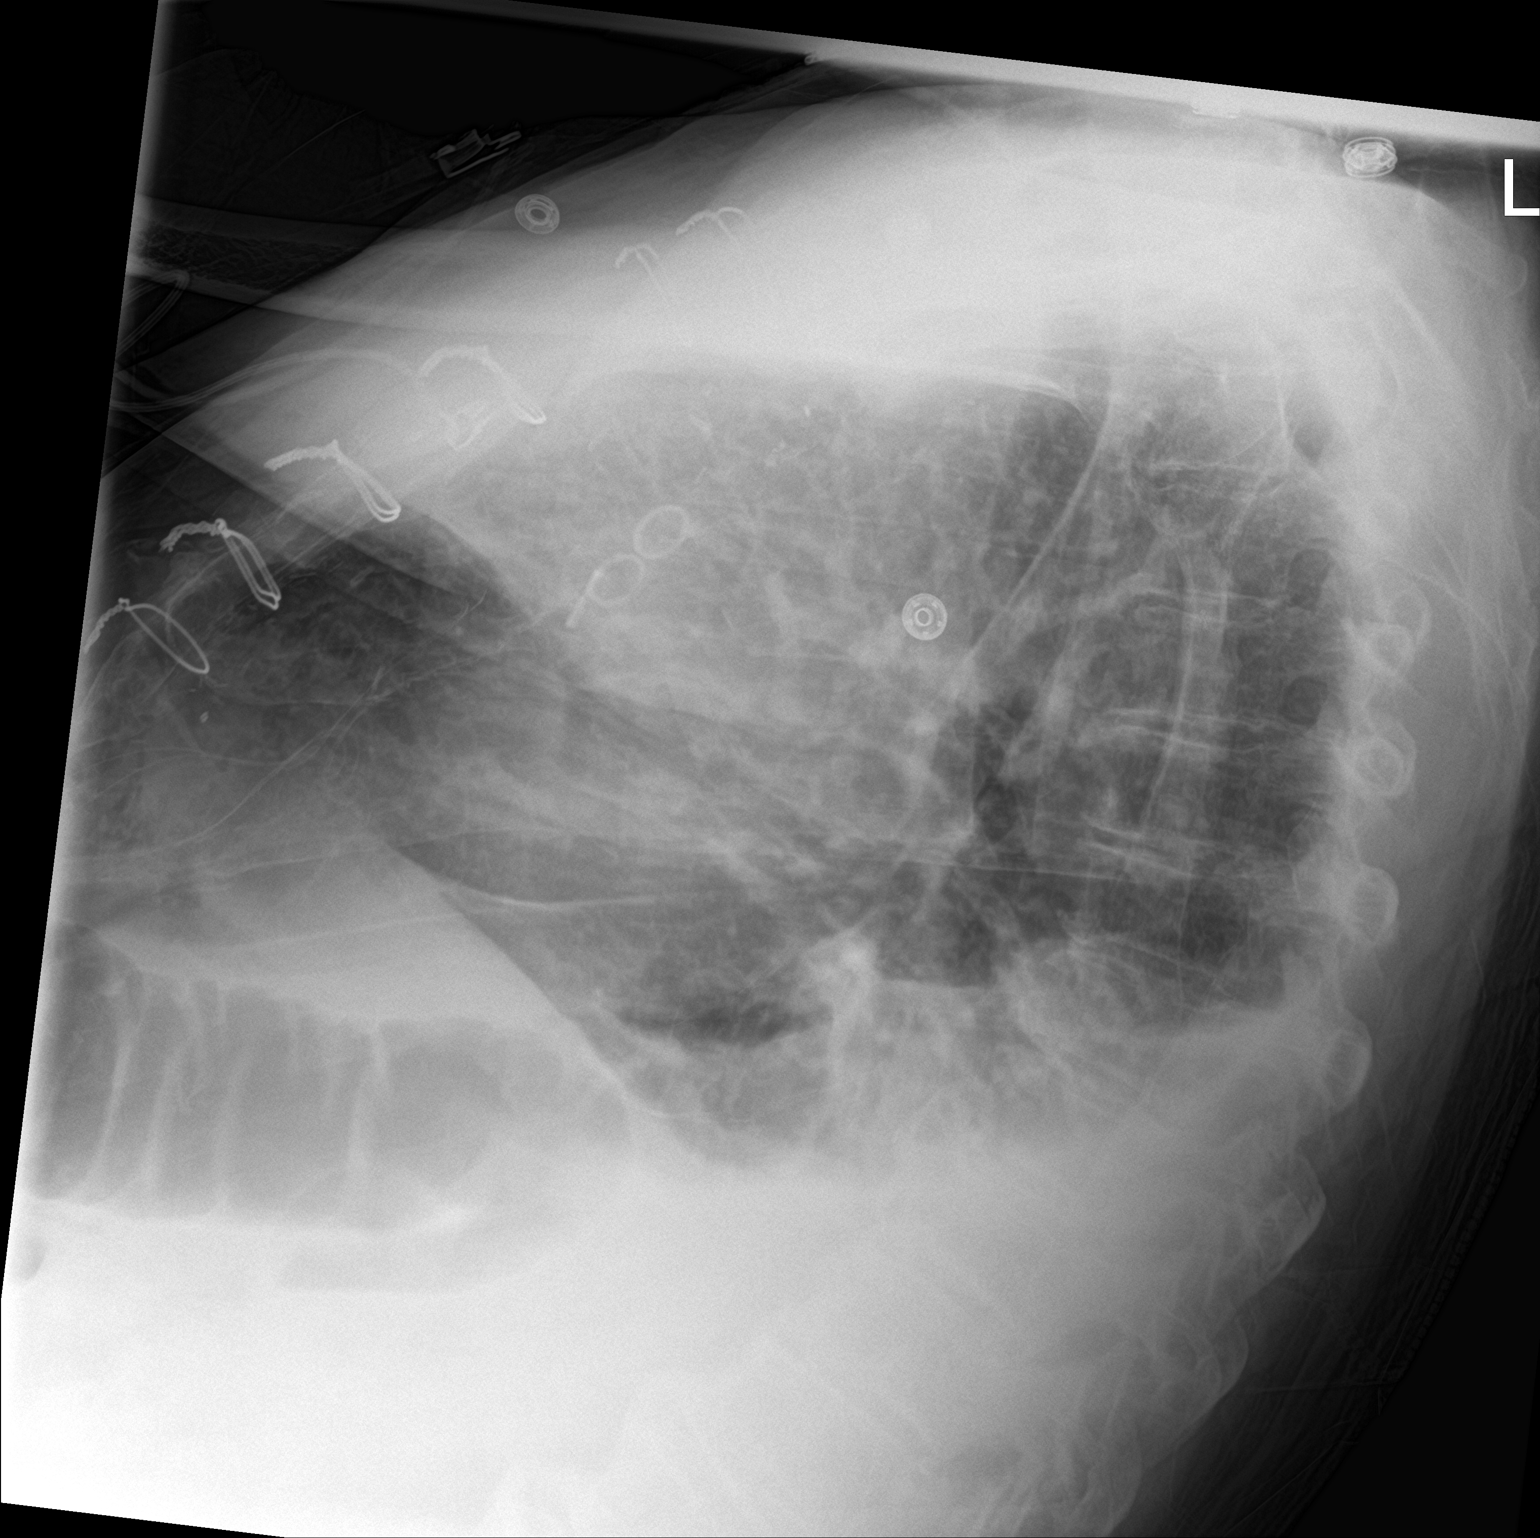

[chest ap]
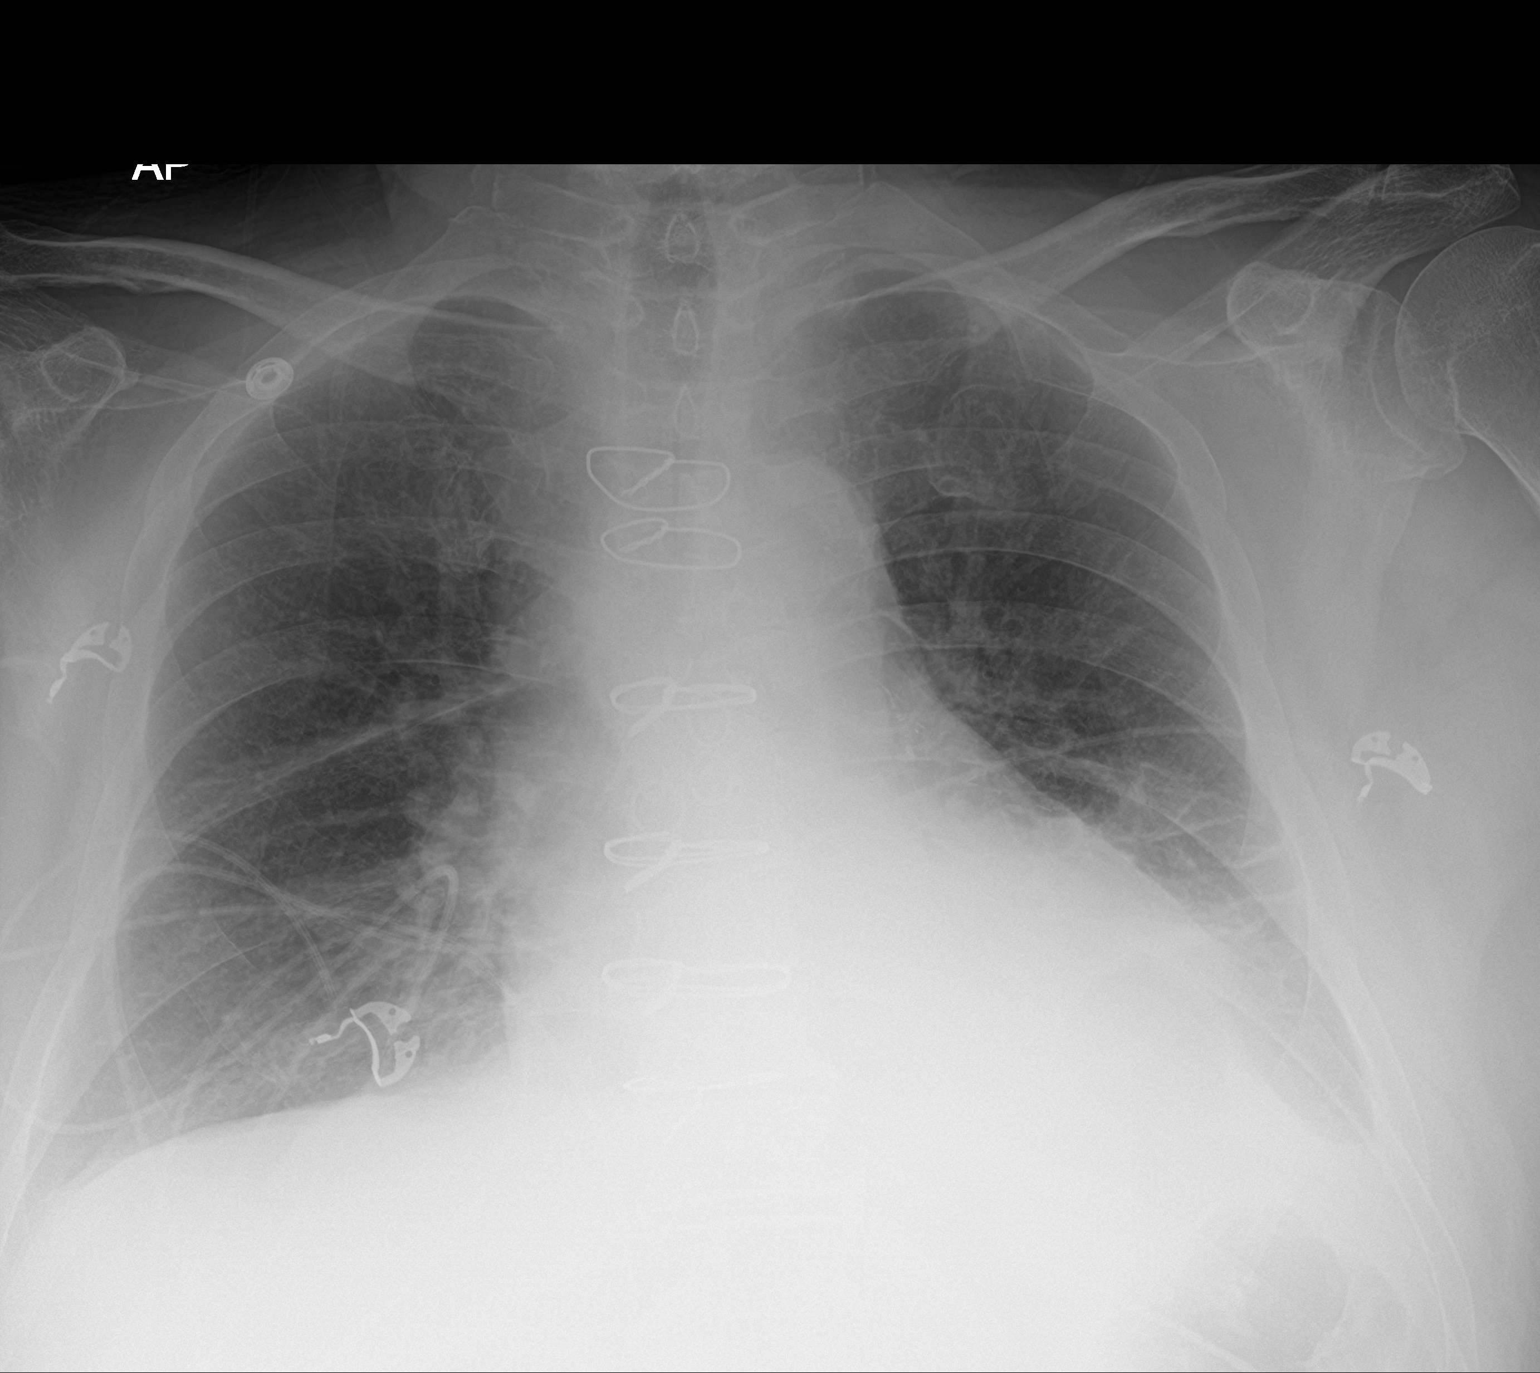

[2 of 2 positions shown; findings below may reference images not displayed]

FINDINGS: 5766 hours. Low lung volumes. The cardio pericardial silhouette is
enlarged. There is left base collapse/consolidation with left
pleural effusion. Interval decrease in vascular congestion. Bones
are diffusely demineralized. Telemetry leads overlie the chest.
IMPRESSION: Low lung volumes with left base collapse/consolidation and left
pleural effusion.

Interval decrease in vascular congestion.

## 2022-03-09 ENCOUNTER — Other Ambulatory Visit: Payer: Self-pay

## 2022-03-09 DIAGNOSIS — Z951 Presence of aortocoronary bypass graft: Secondary | ICD-10-CM | POA: Diagnosis not present

## 2022-03-09 DIAGNOSIS — I2510 Atherosclerotic heart disease of native coronary artery without angina pectoris: Secondary | ICD-10-CM | POA: Diagnosis not present

## 2022-03-09 MED ORDER — ATORVASTATIN CALCIUM 80 MG PO TABS
80.0000 mg | ORAL_TABLET | Freq: Every day | ORAL | 1 refills | Status: DC
Start: 2022-03-09 — End: 2022-03-29

## 2022-03-09 NOTE — Telephone Encounter (Signed)
Yes.   Dr. Sylvan Sookdeo

## 2022-03-09 NOTE — Telephone Encounter (Signed)
Refill has been sent.  °

## 2022-03-10 ENCOUNTER — Encounter (INDEPENDENT_AMBULATORY_CARE_PROVIDER_SITE_OTHER): Payer: HMO | Admitting: Ophthalmology

## 2022-03-10 DIAGNOSIS — D3132 Benign neoplasm of left choroid: Secondary | ICD-10-CM | POA: Diagnosis not present

## 2022-03-10 DIAGNOSIS — I10 Essential (primary) hypertension: Secondary | ICD-10-CM | POA: Diagnosis not present

## 2022-03-10 DIAGNOSIS — H2513 Age-related nuclear cataract, bilateral: Secondary | ICD-10-CM

## 2022-03-10 DIAGNOSIS — H35033 Hypertensive retinopathy, bilateral: Secondary | ICD-10-CM | POA: Diagnosis not present

## 2022-03-10 DIAGNOSIS — H43813 Vitreous degeneration, bilateral: Secondary | ICD-10-CM

## 2022-03-10 DIAGNOSIS — E113311 Type 2 diabetes mellitus with moderate nonproliferative diabetic retinopathy with macular edema, right eye: Secondary | ICD-10-CM | POA: Diagnosis not present

## 2022-03-10 DIAGNOSIS — E113392 Type 2 diabetes mellitus with moderate nonproliferative diabetic retinopathy without macular edema, left eye: Secondary | ICD-10-CM | POA: Diagnosis not present

## 2022-03-10 LAB — CMP14+EGFR
ALT: 19 IU/L (ref 0–44)
AST: 17 IU/L (ref 0–40)
Albumin/Globulin Ratio: 1.8 (ref 1.2–2.2)
Albumin: 4.2 g/dL (ref 3.8–4.8)
Alkaline Phosphatase: 93 IU/L (ref 44–121)
BUN/Creatinine Ratio: 19 (ref 10–24)
BUN: 12 mg/dL (ref 8–27)
Bilirubin Total: 0.4 mg/dL (ref 0.0–1.2)
CO2: 24 mmol/L (ref 20–29)
Calcium: 9.8 mg/dL (ref 8.6–10.2)
Chloride: 101 mmol/L (ref 96–106)
Creatinine, Ser: 0.62 mg/dL — ABNORMAL LOW (ref 0.76–1.27)
Globulin, Total: 2.4 g/dL (ref 1.5–4.5)
Glucose: 173 mg/dL — ABNORMAL HIGH (ref 70–99)
Potassium: 4.4 mmol/L (ref 3.5–5.2)
Sodium: 138 mmol/L (ref 134–144)
Total Protein: 6.6 g/dL (ref 6.0–8.5)
eGFR: 106 mL/min/{1.73_m2} (ref >59)

## 2022-03-10 LAB — LIPID PANEL WITH LDL/HDL RATIO
Cholesterol, Total: 96 mg/dL — ABNORMAL LOW (ref 100–199)
HDL: 40 mg/dL (ref >39)
LDL Chol Calc (NIH): 41 mg/dL (ref 0–99)
LDL/HDL Ratio: 1 ratio (ref 0.0–3.6)
Triglycerides: 66 mg/dL (ref 0–149)
VLDL Cholesterol Cal: 15 mg/dL (ref 5–40)

## 2022-03-10 LAB — LDL CHOLESTEROL, DIRECT: LDL Direct: 36 mg/dL (ref 0–99)

## 2022-03-10 LAB — PRO B NATRIURETIC PEPTIDE: NT-Pro BNP: 303 pg/mL (ref 0–376)

## 2022-03-11 ENCOUNTER — Encounter: Payer: Self-pay | Admitting: Cardiology

## 2022-03-11 ENCOUNTER — Ambulatory Visit: Payer: HMO | Admitting: Cardiology

## 2022-03-11 VITALS — BP 144/78 | HR 80 | Temp 98.2°F | Resp 17 | Ht 71.0 in | Wt 223.0 lb

## 2022-03-11 DIAGNOSIS — I700 Atherosclerosis of aorta: Secondary | ICD-10-CM | POA: Diagnosis not present

## 2022-03-11 DIAGNOSIS — Z87891 Personal history of nicotine dependence: Secondary | ICD-10-CM | POA: Diagnosis not present

## 2022-03-11 DIAGNOSIS — I10 Essential (primary) hypertension: Secondary | ICD-10-CM | POA: Diagnosis not present

## 2022-03-11 DIAGNOSIS — E139 Other specified diabetes mellitus without complications: Secondary | ICD-10-CM

## 2022-03-11 DIAGNOSIS — I214 Non-ST elevation (NSTEMI) myocardial infarction: Secondary | ICD-10-CM

## 2022-03-11 DIAGNOSIS — I2510 Atherosclerotic heart disease of native coronary artery without angina pectoris: Secondary | ICD-10-CM

## 2022-03-11 DIAGNOSIS — Z951 Presence of aortocoronary bypass graft: Secondary | ICD-10-CM

## 2022-03-11 MED ORDER — LISINOPRIL 10 MG PO TABS: 10.0000 mg | ORAL_TABLET | Freq: Every morning | ORAL | 0 refills | Status: DC

## 2022-03-11 NOTE — Progress Notes (Signed)
? ?Date:  03/11/2022  ? ?ID:  Stephen Shelton, DOB 30-Mar-1956, MRN [id number] ? ?PCP:  Tonia Ghent, MD  ?Cardiologist:  Rex Kras, DO, [facility] (established care 11/24/2021) ?Cardiothoracic surgeon: Dr. Roxan Hockey ? ?Date: 03/11/22 ?Last Office Visit: 12/10/2021 ? ?Chief Complaint  ?Patient presents with  ? Follow-up  ?  3 month  ? Coronary Artery Disease  ? cabg  ? ?HPI  ?Stephen Shelton [employer] is a 66 y.o. 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 ? ?Patient was seen in consult during his hospitalization in January 2023 when he presented with chest pain and back pain.  Initial concerns were for aortic symptoms and therefore he underwent CT dissection protocol which was negative.  Given the high sensitive troponins, symptoms, and risk factors patient was requested to undergo left heart catheterization to evaluate for obstructive CAD.  He was noted to have multivessel CAD and underwent four-vessel CABG with Dr. Roxan Hockey on November 25, 2021. ? ?He now presents for 48-monthfollow-up visit.  Patient states that he is doing well from a cardiovascular standpoint.  No longer experiences any anginal discomfort discomfort.  He is compliant with medical therapy.  No change in overall physical endurance.  Home blood pressures range between 1595-638mmHg and diastolic blood pressures ranging 70-85 mmHg.  He continues to be smoke-free and follows up with endocrinology regularly for diabetes management. ? ?FUNCTIONAL STATUS: ?Limited since bypass.   ? ?ALLERGIES: ?Allergies  ?Allergen Reactions  ? Metformin And Related Other (See Comments)  ?  abd pain with med use  ? Sulfa Antibiotics Other (See Comments)  ?  Lightheaded, intolerant, "felt sick"  ? ? ?MEDICATION LIST PRIOR TO VISIT: ?Current Meds  ?Medication Sig   ? aspirin EC 81 MG EC tablet Take 1 tablet (81 mg total) by mouth daily. Swallow whole.  ? atorvastatin (LIPITOR) 80 MG tablet Take 1 tablet (80 mg total) by mouth daily.  ? clopidogrel (PLAVIX) 75 MG tablet TAKE 1 TABLET BY MOUTH EVERY DAY  ? furosemide (LASIX) 40 MG tablet Take 1 tablet (40 mg total) by mouth daily.  ? lipase/protease/amylase (CREON) 36000 UNITS CPEP capsule TAKE 1-2 CAPSULES BY MOUTH 3 TIMES DAILY WITH MEALS.MAY ALSO TAKE 1 CAPSULE AS NEEDED W/ SNACKS  ? Multiple Vitamin (MULTIVITAMIN) tablet Take 1 tablet by mouth daily.  ? nitroGLYCERIN (NITROSTAT) 0.4 MG SL tablet Place 1 tablet (0.4 mg total) under the tongue every 5 (five) minutes as needed for chest pain.  ? [DISCONTINUED] lisinopril (ZESTRIL) 5 MG tablet Take 1 tablet (5 mg total) by mouth daily.  ?  ? ?PAST MEDICAL HISTORY: ?Past Medical History:  ?Diagnosis Date  ? Coronary artery disease   ? [place]   ? Hyperlipidemia   ? Hypertension   ? Septic arthritis ([place]   ? R shoulder, history of  ? Smoker   ? ? ?PAST SURGICAL HISTORY: ?Past Surgical History:  ?Procedure Laterality Date  ? APPENDECTOMY    ? CORONARY ARTERY BYPASS GRAFT Right 11/25/2021  ? Procedure: CORONARY ARTERY BYPASS GRAFTING (CABG) X 4  ON PUMP USING LEFT INTERNAL MAMMARY ARTERY AND RIGHT ENDOSCOPIC GREATER SAPHENOUS VEIN CONDUITS;  Surgeon: HMelrose Nakayama MD;  [place]  Service: Open Heart Surgery;  Laterality: Right;  Right radial artery harvest  ? ENDOVEIN HARVEST OF GREATER SAPHENOUS VEIN Right 11/25/2021  ?  Procedure: ENDOVEIN HARVEST OF GREATER SAPHENOUS VEIN;  Surgeon: Melrose Nakayama, MD;  Location: [place];  Service: Open Heart Surgery;  Laterality: Right;  ? LEFT HEART CATH AND CORONARY ANGIOGRAPHY N/A 11/24/2021  ? Procedure: LEFT HEART CATH AND CORONARY ANGIOGRAPHY;  Surgeon: Nigel Mormon, MD;  Location: [place] [facility];  Service: Cardiovascular;  Laterality: N/A;  ? septic arthritis shoulder    ? ? ?FAMILY  HISTORY: ?The patient family history includes Autoimmune disease in his father; Cancer in his mother; Diabetes in his father and maternal grandfather; Heart attack in his brother. ? ?SOCIAL HISTORY:  ?The patient  reports that he quit smoking about 3 months ago. His smoking use included cigarettes. He has a 16.50 pack-year smoking history. He has never used smokeless tobacco. He reports that he does not currently use alcohol. He reports that he does not use drugs. ? ?REVIEW OF SYSTEMS: ?Review of Systems  ?Cardiovascular:  Negative for chest pain, dyspnea on exertion, leg swelling, near-syncope, orthopnea, palpitations, paroxysmal nocturnal dyspnea and syncope.  ?Respiratory:  Negative for shortness of breath.   ? ?PHYSICAL EXAM: ? ?  03/11/2022  ?  3:30 PM 03/11/2022  ?  3:25 PM 02/22/2022  ?  7:21 AM  ?Vitals with BMI  ?Height  '5\' 11"'$  '5\' 11"'$   ?Weight  223 lbs 219 lbs  ?BMI  31.12 30.56  ?Systolic 761 950 932  ?Diastolic 78 82 80  ?Pulse 80 83 62  ? ? ?CONSTITUTIONAL: Well-developed and well-nourished. No acute distress.  ?SKIN: Skin is warm and dry. No rash noted. No cyanosis. No pallor. No jaundice ?HEAD: Normocephalic and atraumatic.  ?EYES: No scleral icterus ?MOUTH/THROAT: Moist oral membranes.  ?NECK: No JVD present. No thyromegaly noted. No carotid bruits  ?LYMPHATIC: No visible cervical adenopathy.  ?CHEST Normal respiratory effort. No intercostal retractions.  Sternotomy site well-healed.  Clean dry and intact. ?LUNGS: Clear to auscultation bilaterally.  Clear to auscultation bilaterally.  No stridor. No wheezes. No rales.  ?CARDIOVASCULAR: Regular rate and rhythm, positive S1-S2, no murmurs rubs or gallops appreciated. ?ABDOMINAL: Obese, soft, nontender, nondistended, positive bowel sounds all 4 quadrants. No apparent ascites.  ?EXTREMITIES: Trace bilateral peripheral edema, warm to touch, 2+ DP and PT pulses ?HEMATOLOGIC: No significant bruising ?NEUROLOGIC: Oriented to person, place, and time. Nonfocal.  Normal muscle tone.  ?PSYCHIATRIC: Normal mood and affect. Normal behavior. Cooperative ? ?CARDIAC DATABASE: ?EKG: ?12/10/2021: NSR, 83 bpm, LAE, poor R wave progression, consider old anterior infarct, diffuse nonspecific T wave abnormality, without underlying injury pattern.  ? ?Echocardiogram: ?11/24/2021: ? 1. Left ventricular ejection fraction, by estimation, is 50 to 55%. The  left ventricle has low normal function. The left ventricle has no regional wall motion abnormalities. There is mild left ventricular hypertrophy.  ?Left ventricular diastolic parameters are consistent with Grade II diastolic dysfunction (pseudonormalization). Elevated left atrial pressure.  ? 2. Right ventricular systolic function is normal. The right ventricular size is normal.  ? 3. Left atrial size was mildly dilated.  ? 4. The mitral valve is degenerative. Mild mitral valve regurgitation. No evidence of mitral stenosis.  ? 5. The aortic valve is tricuspid. Aortic valve regurgitation is not visualized. Aortic valve sclerosis/calcification is present, without any evidence of aortic stenosis.  ?  ?Stress test: ?None ?  ?Heart catheterization: ?November 24, 2021: ?LM: Normal ?LAD: Complex mid LAD/Diag2/septal 90% trifurcation lesion ([place] 1,1,1) ?Lcx: Prox OM2 80% stenosis ?RCA: Prox CTO. Epicardial right-to-right and left-to-right collaterals, mostly from LAD septals ?  ?LVEDP  normal ?LVEF by LV gram 50-55%.  ?  ?Continue Aspirin, heparin, statin, blood pressure control ?CVTS consulted for CABG ?  ?Carotid duplex: ?11/24/2021: ?Right Carotid: Velocities in the right ICA are consistent with a 1-39% stenosis.  ?Left Carotid: Velocities in the left ICA are consistent with a 1-39% stenosis.  ?Vertebrals:  Bilateral vertebral arteries demonstrate antegrade flow.  ?Subclavians: Left subclavian artery was stenotic. Normal flow hemodynamics were seen in the right subclavian artery.  ?  ?Ankle-brachial index: ?11/24/2021: ?Right ABI: Triphasic waveforms  observed in PTA and DPA.  ?Left ABI: Area of stenosis observed at distal PTA with velocities of 323 cm/s. ?  ?Upper extremity arterial duplex: ?11/24/2021: ?Right Upper Extremity: Doppler waveforms remain within norma

## 2022-03-21 ENCOUNTER — Other Ambulatory Visit: Payer: Self-pay | Admitting: Thoracic Surgery (Cardiothoracic Vascular Surgery)

## 2022-03-29 ENCOUNTER — Other Ambulatory Visit: Payer: Self-pay | Admitting: Family Medicine

## 2022-03-29 ENCOUNTER — Telehealth: Payer: Self-pay | Admitting: Cardiology

## 2022-03-29 ENCOUNTER — Other Ambulatory Visit: Payer: Self-pay

## 2022-03-29 DIAGNOSIS — I2510 Atherosclerotic heart disease of native coronary artery without angina pectoris: Secondary | ICD-10-CM

## 2022-03-29 DIAGNOSIS — I10 Essential (primary) hypertension: Secondary | ICD-10-CM

## 2022-03-29 DIAGNOSIS — Z951 Presence of aortocoronary bypass graft: Secondary | ICD-10-CM

## 2022-03-29 MED ORDER — METOPROLOL TARTRATE 37.5 MG PO TABS: 1.0000 | ORAL_TABLET | Freq: Two times a day (BID) | ORAL | 3 refills | Status: DC

## 2022-03-29 MED ORDER — LISINOPRIL 10 MG PO TABS: 10.0000 mg | ORAL_TABLET | Freq: Every morning | ORAL | 3 refills | Status: DC

## 2022-03-29 MED ORDER — CLOPIDOGREL BISULFATE 75 MG PO TABS
75.0000 mg | ORAL_TABLET | Freq: Every day | ORAL | 3 refills | Status: DC
Start: 2022-03-29 — End: 2023-03-21

## 2022-03-29 MED ORDER — ASPIRIN 81 MG PO TBEC: 81.0000 mg | DELAYED_RELEASE_TABLET | Freq: Every day | ORAL | 3 refills | Status: DC

## 2022-03-29 MED ORDER — ATORVASTATIN CALCIUM 80 MG PO TABS: 80.0000 mg | ORAL_TABLET | Freq: Every day | ORAL | 3 refills | Status: DC

## 2022-04-12 ENCOUNTER — Other Ambulatory Visit: Payer: HMO

## 2022-04-20 ENCOUNTER — Encounter: Payer: Self-pay | Admitting: Family Medicine

## 2022-04-20 ENCOUNTER — Ambulatory Visit (INDEPENDENT_AMBULATORY_CARE_PROVIDER_SITE_OTHER): Payer: HMO | Admitting: Family Medicine

## 2022-04-20 VITALS — BP 138/80 | HR 72 | Temp 98.0°F | Ht 71.0 in | Wt 227.0 lb

## 2022-04-20 DIAGNOSIS — I10 Essential (primary) hypertension: Secondary | ICD-10-CM | POA: Diagnosis not present

## 2022-04-20 DIAGNOSIS — E785 Hyperlipidemia, unspecified: Secondary | ICD-10-CM

## 2022-04-20 DIAGNOSIS — Z7189 Other specified counseling: Secondary | ICD-10-CM

## 2022-04-20 DIAGNOSIS — Z1211 Encounter for screening for malignant neoplasm of colon: Secondary | ICD-10-CM

## 2022-04-20 DIAGNOSIS — K8689 Other specified diseases of pancreas: Secondary | ICD-10-CM | POA: Diagnosis not present

## 2022-04-20 DIAGNOSIS — E139 Other specified diabetes mellitus without complications: Secondary | ICD-10-CM

## 2022-04-20 DIAGNOSIS — Z0000 Encounter for general adult medical examination without abnormal findings: Secondary | ICD-10-CM

## 2022-04-20 DIAGNOSIS — E782 Mixed hyperlipidemia: Secondary | ICD-10-CM

## 2022-04-20 NOTE — Patient Instructions (Addendum)
Let me know which meter is approved to check your sugar.    Cologuard ordered.   Update me as needed.  Thanks for your effort.   Take care.  Glad to see you.

## 2022-04-20 NOTE — Progress Notes (Unsigned)
I have personally reviewed the Medicare Annual Wellness questionnaire and have noted 1. The patient's medical and social history 2. Their use of alcohol, tobacco or illicit drugs 3. Their current medications and supplements 4. The patient's functional ability including ADL's, fall risks, home safety risks and hearing or visual             impairment. 5. Diet and physical activities 6. Evidence for depression or mood disorders  The patients weight, height, BMI have been recorded in the chart and visual acuity is per eye clinic.  I have made referrals, counseling and provided education to the patient based review of the above and I have provided the pt with a written personalized care plan for preventive services.  Provider list updated- see scanned forms.  Routine anticipatory guidance given to patient.  See health maintenance. The possibility exists that previously documented standard health maintenance information may have been brought forward from a previous encounter into this note.  If needed, that same information has been updated to reflect the current situation based on today's encounter.    Flu Shingles PNA Tetanus Cologuard ordered 2023.  Breast cancer screening Prostate cancer screening Advance directive Cognitive function addressed- see scanned forms- and if abnormal then additional documentation follows.   In addition to [facility], follow up visit for the below conditions:  D/w pt about creon use.  He notes a change with skipping a dose, ie watery stools if not using.   Hypertension:    Using medication without problems or lightheadedness: yes Chest pain with exertion: no but still has sternal irritation.   Edema: a small amount R>L with prev harvest site.   Short of breath: no  Elevated Cholesterol: Using medications without problems:yes Muscle aches: no Diet compliance: yes Exercise: yes Recent labs d/w pt.  Lipids at goal.    Diabetes:  No meds.  A1c  7.2, followed by endo.  Prev imaging d/w pt: Pancreas: No focal abnormality is seen.  PMH and SH reviewed  Meds, vitals, and allergies reviewed.   ROS: Per HPI.  Unless specifically indicated otherwise in HPI, the patient denies:  General: fever. Eyes: acute vision changes ENT: sore throat Cardiovascular: chest pain Respiratory: SOB GI: vomiting GU: dysuria Musculoskeletal: acute back pain Derm: acute rash Neuro: acute motor dysfunction Psych: worsening mood Endocrine: polydipsia Heme: bleeding Allergy: hayfever  GEN: nad, alert and oriented HEENT: mucous membranes moist NECK: supple w/o LA CV: rrr. PULM: ctab, no inc wob ABD: soft, +bs EXT: no edema SKIN: well perfused.

## 2022-04-21 NOTE — Assessment & Plan Note (Signed)
Continue atorvastatin

## 2022-04-21 NOTE — Assessment & Plan Note (Signed)
Flu prev done Shingles prev done PNA prev done Tetanus prev done Covid prev done Cologuard ordered 2023.  Prostate cancer screening and PSA options (with potential risks and benefits of testing vs not testing) were discussed along with recent recs/guidelines.  He declined testing PSA at this point. Advance directive- wife designated if patient were incapacitated.   Cognitive function addressed- see scanned forms- and if abnormal then additional documentation follows.

## 2022-04-21 NOTE — Assessment & Plan Note (Signed)
No meds.  A1c 7.2, followed by endo.  Prev imaging d/w pt: Pancreas: No focal abnormality is seen. I'll defer.  He agrees.

## 2022-04-21 NOTE — Assessment & Plan Note (Signed)
Advance directive- wife designated if patient were incapacitated.  

## 2022-04-21 NOTE — Assessment & Plan Note (Signed)
Continue lisinopril.  Continue work on diet and exercise. 

## 2022-04-21 NOTE — Assessment & Plan Note (Signed)
D/w pt about creon use.  He notes a change with skipping a dose, ie watery stools if not using. Compliant o/w w/o ADE on med.   Continue creon.

## 2022-04-26 NOTE — Addendum Note (Signed)
Addended by: Tonia Ghent on: 04/26/2022 07:50 AM   Modules accepted: Level of Service

## 2022-04-28 ENCOUNTER — Encounter (INDEPENDENT_AMBULATORY_CARE_PROVIDER_SITE_OTHER): Payer: HMO | Admitting: Ophthalmology

## 2022-04-28 DIAGNOSIS — H35033 Hypertensive retinopathy, bilateral: Secondary | ICD-10-CM | POA: Diagnosis not present

## 2022-04-28 DIAGNOSIS — E113311 Type 2 diabetes mellitus with moderate nonproliferative diabetic retinopathy with macular edema, right eye: Secondary | ICD-10-CM | POA: Diagnosis not present

## 2022-04-28 DIAGNOSIS — E113392 Type 2 diabetes mellitus with moderate nonproliferative diabetic retinopathy without macular edema, left eye: Secondary | ICD-10-CM

## 2022-04-28 DIAGNOSIS — H43813 Vitreous degeneration, bilateral: Secondary | ICD-10-CM

## 2022-04-28 DIAGNOSIS — I10 Essential (primary) hypertension: Secondary | ICD-10-CM | POA: Diagnosis not present

## 2022-04-28 DIAGNOSIS — D3132 Benign neoplasm of left choroid: Secondary | ICD-10-CM | POA: Diagnosis not present

## 2022-05-13 ENCOUNTER — Encounter: Payer: Self-pay | Admitting: Family Medicine

## 2022-05-18 MED ORDER — DEXCOM G7 SENSOR MISC: 5 refills | Status: DC

## 2022-05-18 MED ORDER — DEXCOM G7 RECEIVER DEVI: 3 refills | Status: DC

## 2022-05-19 ENCOUNTER — Telehealth: Payer: Self-pay

## 2022-05-19 NOTE — Telephone Encounter (Signed)
Received approval for prior auth for both Dexcom G7 Receiver and Dexcom G7 Sensor device.  Drug Approved: Dexcom G7 Receiver, Dexcom G7 Sensor device  Coverage Start Date: 05/18/2022  Coverage End Date: 11/21/2022  Coverage Limitations: Approved under Medicare Part B  FYI, we also got a denial letter for the same prescription which stated that these prior auths were denied through Medicare Part D, but that they were approved through Medicare Part B.  Sent both letters to scanning.

## 2022-06-01 ENCOUNTER — Ambulatory Visit: Payer: HMO | Admitting: Family Medicine

## 2022-06-08 ENCOUNTER — Telehealth: Payer: Self-pay | Admitting: Family Medicine

## 2022-06-08 NOTE — Telephone Encounter (Signed)
Called to scheudle AWV with [facility], but mail box was full.

## 2022-06-08 NOTE — Telephone Encounter (Signed)
LVM for pt to rtn my call to schedule AWV with [facility] call back # [phone] 

## 2022-06-16 ENCOUNTER — Encounter (INDEPENDENT_AMBULATORY_CARE_PROVIDER_SITE_OTHER): Payer: PPO | Admitting: Ophthalmology

## 2022-06-16 DIAGNOSIS — D3132 Benign neoplasm of left choroid: Secondary | ICD-10-CM | POA: Diagnosis not present

## 2022-06-16 DIAGNOSIS — H43813 Vitreous degeneration, bilateral: Secondary | ICD-10-CM | POA: Diagnosis not present

## 2022-06-16 DIAGNOSIS — I10 Essential (primary) hypertension: Secondary | ICD-10-CM

## 2022-06-16 DIAGNOSIS — H35033 Hypertensive retinopathy, bilateral: Secondary | ICD-10-CM | POA: Diagnosis not present

## 2022-06-16 DIAGNOSIS — E113393 Type 2 diabetes mellitus with moderate nonproliferative diabetic retinopathy without macular edema, bilateral: Secondary | ICD-10-CM

## 2022-07-14 ENCOUNTER — Ambulatory Visit: Payer: Self-pay

## 2022-07-14 NOTE — Patient Outreach (Signed)
  Care Coordination   07/14/2022 Name: Stephen Shelton MRN: [id number] DOB: 08-06-1956   Care Coordination Outreach Attempts:  An unsuccessful telephone outreach was attempted today to offer the patient information about available care coordination services as a benefit of their health plan.   Follow Up Plan:  Additional outreach attempts will be made to offer the patient care coordination information and services.   Encounter Outcome:  No Answer  Care Coordination Interventions Activated:  No   Care Coordination Interventions:  No, not indicated    Quinn Plowman RN,BSN,CCM RN Care Manager Coordinator [phone]

## 2022-07-28 ENCOUNTER — Other Ambulatory Visit: Payer: Self-pay

## 2022-07-28 DIAGNOSIS — I10 Essential (primary) hypertension: Secondary | ICD-10-CM

## 2022-07-28 DIAGNOSIS — I2510 Atherosclerotic heart disease of native coronary artery without angina pectoris: Secondary | ICD-10-CM

## 2022-07-28 DIAGNOSIS — Z951 Presence of aortocoronary bypass graft: Secondary | ICD-10-CM

## 2022-07-28 MED ORDER — LISINOPRIL 10 MG PO TABS: 10.0000 mg | ORAL_TABLET | Freq: Every morning | ORAL | 3 refills | Status: DC

## 2022-08-04 ENCOUNTER — Encounter (INDEPENDENT_AMBULATORY_CARE_PROVIDER_SITE_OTHER): Payer: HMO | Admitting: Ophthalmology

## 2022-08-04 DIAGNOSIS — E113311 Type 2 diabetes mellitus with moderate nonproliferative diabetic retinopathy with macular edema, right eye: Secondary | ICD-10-CM

## 2022-08-04 DIAGNOSIS — D3132 Benign neoplasm of left choroid: Secondary | ICD-10-CM | POA: Diagnosis not present

## 2022-08-04 DIAGNOSIS — H35033 Hypertensive retinopathy, bilateral: Secondary | ICD-10-CM

## 2022-08-04 DIAGNOSIS — E113392 Type 2 diabetes mellitus with moderate nonproliferative diabetic retinopathy without macular edema, left eye: Secondary | ICD-10-CM | POA: Diagnosis not present

## 2022-08-04 DIAGNOSIS — I10 Essential (primary) hypertension: Secondary | ICD-10-CM | POA: Diagnosis not present

## 2022-08-04 DIAGNOSIS — H43813 Vitreous degeneration, bilateral: Secondary | ICD-10-CM

## 2022-08-24 ENCOUNTER — Ambulatory Visit: Payer: HMO | Admitting: Internal Medicine

## 2022-09-07 ENCOUNTER — Telehealth: Payer: Self-pay | Admitting: Cardiology

## 2022-09-07 NOTE — Telephone Encounter (Signed)
Patient asking if he needs to have lab work done prior to his follow up. If so, can some orders be placed. Thank you.

## 2022-09-08 NOTE — Telephone Encounter (Signed)
No labs.  However, if he has outside labs please arrange a copy.   Dr.Arianah Torgeson

## 2022-09-10 ENCOUNTER — Encounter: Payer: Self-pay | Admitting: Cardiology

## 2022-09-10 ENCOUNTER — Ambulatory Visit: Payer: HMO | Admitting: Cardiology

## 2022-09-10 VITALS — BP 132/73 | HR 74 | Temp 98.5°F | Resp 16 | Ht 71.0 in | Wt 225.6 lb

## 2022-09-10 DIAGNOSIS — E139 Other specified diabetes mellitus without complications: Secondary | ICD-10-CM

## 2022-09-10 DIAGNOSIS — I214 Non-ST elevation (NSTEMI) myocardial infarction: Secondary | ICD-10-CM

## 2022-09-10 DIAGNOSIS — I2510 Atherosclerotic heart disease of native coronary artery without angina pectoris: Secondary | ICD-10-CM

## 2022-09-10 DIAGNOSIS — Z951 Presence of aortocoronary bypass graft: Secondary | ICD-10-CM

## 2022-09-10 DIAGNOSIS — Z87891 Personal history of nicotine dependence: Secondary | ICD-10-CM

## 2022-09-10 DIAGNOSIS — I700 Atherosclerosis of aorta: Secondary | ICD-10-CM

## 2022-09-10 DIAGNOSIS — I10 Essential (primary) hypertension: Secondary | ICD-10-CM

## 2022-09-10 NOTE — Progress Notes (Signed)
Date:  09/10/2022   ID:  Stephen Shelton, DOB Jul 04, 1956, MRN [id number]  PCP:  Tonia Ghent, MD  Cardiologist:  Rex Kras, DO, [facility] (established care 11/24/2021) Cardiothoracic surgeon: Dr. Roxan Hockey  Date: 09/10/22 Last Office Visit: 03/11/2022  Chief Complaint  Patient presents with   Coronary Artery Disease   Follow-up    6 month   HPI  Stephen Shelton is a 66 y.o. 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  Established care and January 2023 when he presented to the ED for chest pain.  His symptoms were very concerning for angina pectoris.  Underwent left heart catheterization and was noted to have obstructive CAD.  He underwent four-vessel CABG with Dr. Roxan Hockey on November 25, 2021.  Patient now presents today for 77-monthfollow-up visit.  Over the last 6 months patient states that he is doing well from a cardiovascular standpoint.  He continues to be smoke-free.  His home blood pressures are well controlled.  His overall physical endurance remains a stable.  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  ALLERGIES: Allergies  Allergen Reactions   Metformin And Related Other (See Comments)    abd pain with med use   Sulfa Antibiotics Other (See Comments)    Lightheaded, intolerant, "felt sick"    MEDICATION LIST PRIOR TO VISIT: Current Meds  Medication Sig   aspirin 81 MG EC tablet Take 1 tablet (81 mg total) by mouth daily. Swallow whole.   atorvastatin (LIPITOR) 80 MG tablet Take 1 tablet (80 mg total) by mouth daily.   clopidogrel (PLAVIX) 75 MG tablet Take 1 tablet (75 mg total) by mouth daily.   Continuous Blood Gluc Receiver (DEXCOM G7 RECEIVER) DEVI Use to check blood sugar  daily. Dx E10.42   Continuous Blood Gluc Sensor (DEXCOM G7 SENSOR) MISC Use to check blood sugar daily. Dx E10.42   CREON 36000-114000 units CPEP capsule TAKE 1-2 CAPSULES BY MOUTH 3 TIMES DAILY WITH MEALS.MAY ALSO TAKE 1 CAPSULE AS NEEDED W/ SNACKS   lisinopril (ZESTRIL) 10 MG tablet Take 1 tablet (10 mg total) by mouth every morning.   Metoprolol Tartrate 37.5 MG TABS Take 1 tablet by mouth 2 (two) times daily.   Multiple Vitamin (MULTIVITAMIN) tablet Take 1 tablet by mouth daily.   nitroGLYCERIN (NITROSTAT) 0.4 MG SL tablet Place 1 tablet (0.4 mg total) under the tongue every 5 (five) minutes as needed for chest pain.     PAST MEDICAL HISTORY: Past Medical History:  Diagnosis Date   Coronary artery disease    Diabetes mellitus (HCC)    Hyperlipidemia    Hypertension    Septic arthritis ([place]    R shoulder, history of   Smoker     PAST SURGICAL HISTORY: Past Surgical History:  Procedure Laterality Date   APPENDECTOMY     CORONARY ARTERY BYPASS GRAFT Right 11/25/2021   Procedure: CORONARY ARTERY BYPASS GRAFTING (CABG) X 4  ON PUMP USING LEFT INTERNAL MAMMARY ARTERY AND RIGHT ENDOSCOPIC GREATER SAPHENOUS VEIN CONDUITS;  Surgeon: HMelrose Nakayama MD;  [place]  Service: Open Heart Surgery;  Laterality: Right;  Right radial artery harvest   ENDOVEIN HARVEST OF GREATER SAPHENOUS VEIN Right 11/25/2021   Procedure: ENDOVEIN HARVEST OF GREATER  SAPHENOUS VEIN;  Surgeon: Melrose Nakayama, MD;  Location: [place];  Service: Open Heart Surgery;  Laterality: Right;   LEFT HEART CATH AND CORONARY ANGIOGRAPHY N/A 11/24/2021   Procedure: LEFT HEART CATH AND CORONARY ANGIOGRAPHY;  Surgeon: Nigel Mormon, MD;  Location: [place] [facility];  Service: Cardiovascular;  Laterality: N/A;   septic arthritis shoulder      FAMILY HISTORY: The patient family history includes Autoimmune disease in his father; Cancer in his mother; Diabetes in his father and maternal grandfather; Heart  attack in his brother.  SOCIAL HISTORY:  The patient  reports that he quit smoking about 9 months ago. His smoking use included cigarettes. He has a 16.50 pack-year smoking history. He has never used smokeless tobacco. He reports that he does not currently use alcohol. He reports that he does not use drugs.  REVIEW OF SYSTEMS: Review of Systems  Cardiovascular:  Negative for chest pain, dyspnea on exertion, leg swelling, near-syncope, orthopnea, palpitations, paroxysmal nocturnal dyspnea and syncope.  Respiratory:  Negative for shortness of breath.     PHYSICAL EXAM:    09/10/2022    2:27 PM 04/20/2022   10:57 AM 03/11/2022    3:30 PM  Vitals with BMI  Height '5\' 11"'$  '5\' 11"'$    Weight 225 lbs 10 oz 227 lbs   BMI 63.84 66.59   Systolic 935 701 779  Diastolic 73 80 78  Pulse 74 72 80   CONSTITUTIONAL: Well-developed and well-nourished. No acute distress.  SKIN: Skin is warm and dry. No rash noted. No cyanosis. No pallor. No jaundice HEAD: Normocephalic and atraumatic.  EYES: No scleral icterus MOUTH/THROAT: Moist oral membranes.  NECK: No JVD present. No thyromegaly noted. No carotid bruits  CHEST Normal respiratory effort. No intercostal retractions.  Sternotomy site well-healed.  Clean dry and intact. LUNGS: Clear to auscultation bilaterally.  Clear to auscultation bilaterally.  No stridor. No wheezes. No rales.  CARDIOVASCULAR: Regular rate and rhythm, positive S1-S2, no murmurs rubs or gallops appreciated. ABDOMINAL: Obese, soft, nontender, nondistended, positive bowel sounds all 4 quadrants. No apparent ascites.  EXTREMITIES: Trace bilateral peripheral edema, warm to touch, 2+ DP and PT pulses HEMATOLOGIC: No significant bruising NEUROLOGIC: Oriented to person, place, and time. Nonfocal. Normal muscle tone.  PSYCHIATRIC: Normal mood and affect. Normal behavior. Cooperative  CARDIAC DATABASE: EKG: 09/10/2022: Normal sinus rhythm, 70 bpm, normal axis, without underlying injury  pattern.  Echocardiogram: 11/24/2021:  1. Left ventricular ejection fraction, by estimation, is 50 to 55%. The  left ventricle has low normal function. The left ventricle has no regional wall motion abnormalities. There is mild left ventricular hypertrophy.  Left ventricular diastolic parameters are consistent with Grade II diastolic dysfunction (pseudonormalization). Elevated left atrial pressure.   2. Right ventricular systolic function is normal. The right ventricular size is normal.   3. Left atrial size was mildly dilated.   4. The mitral valve is degenerative. Mild mitral valve regurgitation. No evidence of mitral stenosis.   5. The aortic valve is tricuspid. Aortic valve regurgitation is not visualized. Aortic valve sclerosis/calcification is present, without any evidence of aortic stenosis.    Stress test: None   Heart catheterization: November 24, 2021: LM: Normal LAD: Complex mid LAD/Diag2/septal 90% trifurcation lesion ([place] 1,1,1) Lcx: Prox OM2 80% stenosis RCA: Prox CTO. Epicardial right-to-right and left-to-right collaterals, mostly from LAD septals   LVEDP normal LVEF by LV gram 50-55%.    Continue Aspirin, heparin, statin, blood pressure control CVTS consulted for CABG  Carotid duplex: 11/24/2021: Right Carotid: Velocities in the right ICA are consistent with a 1-39% stenosis.  Left Carotid: Velocities in the left ICA are consistent with a 1-39% stenosis.  Vertebrals:  Bilateral vertebral arteries demonstrate antegrade flow.  Subclavians: Left subclavian artery was stenotic. Normal flow hemodynamics were seen in the right subclavian artery.    Ankle-brachial index: 11/24/2021: Right ABI: Triphasic waveforms observed in PTA and DPA.  Left ABI: Area of stenosis observed at distal PTA with velocities of 323 cm/s.   Upper extremity arterial duplex: 11/24/2021: Right Upper Extremity: Doppler waveforms remain within normal limits with right radial compression. Doppler  waveform obliterate with right ulnar compression.  Left Upper Extremity: Doppler waveform obliterate with left radial compression. Doppler waveforms remain within normal limits with left ulnar compression.  LABORATORY DATA:    Latest Ref Rng & Units 12/07/2021   12:17 PM 11/28/2021    1:18 AM 11/27/2021    3:33 AM  CBC  WBC 4.0 - 10.5 K/uL 13.9  10.8  12.4   Hemoglobin 13.0 - 17.0 g/dL 11.6  10.3  10.4   Hematocrit 39.0 - 52.0 % 35.3  31.1  31.9   Platelets 150.0 - 400.0 K/uL 407.0  134  128        Latest Ref Rng & Units 03/09/2022    8:30 AM 12/07/2021   12:17 PM 11/28/2021    1:18 AM  CMP  Glucose 70 - 99 mg/dL 173  172  108   BUN 8 - 27 mg/dL '12  14  16   '$ Creatinine 0.76 - 1.27 mg/dL 0.62  0.68  0.62   Sodium 134 - 144 mmol/L 138  136  132   Potassium 3.5 - 5.2 mmol/L 4.4  4.6  3.7   Chloride 96 - 106 mmol/L 101  101  102   CO2 20 - 29 mmol/L '24  28  26   '$ Calcium 8.6 - 10.2 mg/dL 9.8  9.1  8.5   Total Protein 6.0 - 8.5 g/dL 6.6     Total Bilirubin 0.0 - 1.2 mg/dL 0.4     Alkaline Phos 44 - 121 IU/L 93     AST 0 - 40 IU/L 17     ALT 0 - 44 IU/L 19       Lipid Panel  Lab Results  Component Value Date   CHOL 96 (L) 03/09/2022   HDL 40 03/09/2022   LDLCALC 41 03/09/2022   LDLDIRECT 36 03/09/2022   TRIG 66 03/09/2022   CHOLHDL 2 06/04/2021    No components found for: "NTPROBNP" Recent Labs    03/09/22 0830  PROBNP 303   Recent Labs    11/24/21 0906  TSH 0.871    BMP Recent Labs    11/26/21 1724 11/27/21 0333 11/28/21 0118 12/07/21 1217 03/09/22 0830  NA 131* 131* 132* 136 138  K 4.1 4.0 3.7 4.6 4.4  CL 101 100 102 101 101  CO2 '25 26 26 28 24  '$ GLUCOSE 171* 103* 108* 172* 173*  BUN '16 17 16 14 12  '$ CREATININE 0.80 0.59* 0.62 0.68 0.62*  CALCIUM 8.7* 8.4* 8.5* 9.1 9.8  GFRNONAA >60 >60 >60  --   --     HEMOGLOBIN A1C Lab Results  Component Value Date   HGBA1C 7.2 (A) 02/22/2022   MPG 157.07 11/24/2021    IMPRESSION:    ICD-10-CM   1. Coronary  artery disease involving native coronary artery of native heart without angina pectoris  I25.10  EKG 12-Lead    2. Hx of CABG  Z95.1     3. Non-ST elevation (NSTEMI) myocardial infarction ([place])  I21.4     4. 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 Benign hypertension  I10     6. Aortic atherosclerosis (HCC)  I70.0     7. Former smoker  Z87.891        RECOMMENDATIONS: Stephen Shelton is a 33 y.o. 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  Coronary artery disease involving native coronary artery of native heart without angina pectoris /history of CABG/NSTEMI: Denies angina pectoris. Presented as N[facility]I on November 24, 2021 at [facility]. Work-up noted multivessel CAD and patient underwent four-vessel bypass on 11/25/2020 (LIMA to LAD, SVG to diagonal, SVG to OM, SVG to PDA). No use of sublingual nitroglycerin tablets. EKG nonischemic. Patient is making every effort to improve his modifiable cardiovascular risk factors. We will have repeat labs with PCP in November/December 2023.  He is requested to send [place] a copy for reference.  Educated him on the importance of increasing physical activity as tolerated with a goal of 30 minutes a day 5 days a week of moderate intensity exercise.  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 Follows with endocrinology.  Benign hypertension Office blood pressures are within acceptable range. Home blood pressures are better controlled. Has tolerated the uptitration of lisinopril. Monitor for now  Aortic atherosclerosis ([place]) Continue antiplatelet therapy and statin  Former smoker Patient stopped smoking as of January 2023. He continues to  be smoke-free. Congratulated on his efforts.  Patient remains stable from a cardiovascular standpoint.  He will have labs in November/December 2023 and he will forward [place] a copy for reference.  I would like to see him back after his well visit in January 2025 or sooner if needed.  FINAL MEDICATION LIST END OF ENCOUNTER: No orders of the defined types were placed in this encounter.   Medications Discontinued During This Encounter  Medication Reason   furosemide (LASIX) 40 MG tablet      Current Outpatient Medications:    aspirin 81 MG EC tablet, Take 1 tablet (81 mg total) by mouth daily. Swallow whole., Disp: 90 tablet, Rfl: 3   atorvastatin (LIPITOR) 80 MG tablet, Take 1 tablet (80 mg total) by mouth daily., Disp: 90 tablet, Rfl: 3   clopidogrel (PLAVIX) 75 MG tablet, Take 1 tablet (75 mg total) by mouth daily., Disp: 90 tablet, Rfl: 3   Continuous Blood Gluc Receiver ([place]) DEVI, Use to check blood sugar daily. Dx E10.42, Disp: 1 each, Rfl: 3   Continuous Blood Gluc Sensor (DEXCOM G7 SENSOR) MISC, Use to check blood sugar daily. Dx E10.42, Disp: 1 each, Rfl: 5   CREON 36000-114000 units CPEP capsule, TAKE 1-2 CAPSULES BY MOUTH 3 TIMES DAILY WITH MEALS.MAY ALSO TAKE 1 CAPSULE AS NEEDED W/ SNACKS, Disp: 240 capsule, Rfl: 3   lisinopril (ZESTRIL) 10 MG tablet, Take 1 tablet (10 mg total) by mouth every morning., Disp: 90 tablet, Rfl: 3   Metoprolol Tartrate 37.5 MG TABS, Take 1 tablet by mouth 2 (two) times daily., Disp: 180 tablet, Rfl: 3   Multiple Vitamin (MULTIVITAMIN) tablet, Take 1 tablet by mouth daily., Disp: , Rfl:  nitroGLYCERIN (NITROSTAT) 0.4 MG SL tablet, Place 1 tablet (0.4 mg total) under the tongue every 5 (five) minutes as needed for chest pain., Disp: 50 tablet, Rfl: 3  Orders Placed This Encounter  Procedures   EKG 12-Lead    There are no Patient Instructions on file for this visit.   --Continue cardiac medications as reconciled in final medication  list. --Return in about 15 months (around 11/28/2023) for Follow up, CAD. Or sooner if needed. --Continue follow-up with your primary care physician regarding the management of your other chronic comorbid conditions.  Patient's questions and concerns were addressed to his satisfaction. He voices understanding of the instructions provided during this encounter.   This note was created using a voice recognition software as a result there may be grammatical errors inadvertently enclosed that do not reflect the nature of this encounter. Every attempt is made to correct such errors.  Rex Kras, [place], [facility]  Pager: [phone] Office: [phone]

## 2022-09-22 ENCOUNTER — Encounter (INDEPENDENT_AMBULATORY_CARE_PROVIDER_SITE_OTHER): Payer: HMO | Admitting: Ophthalmology

## 2022-09-22 DIAGNOSIS — D3132 Benign neoplasm of left choroid: Secondary | ICD-10-CM | POA: Diagnosis not present

## 2022-09-22 DIAGNOSIS — I10 Essential (primary) hypertension: Secondary | ICD-10-CM | POA: Diagnosis not present

## 2022-09-22 DIAGNOSIS — E113393 Type 2 diabetes mellitus with moderate nonproliferative diabetic retinopathy without macular edema, bilateral: Secondary | ICD-10-CM | POA: Diagnosis not present

## 2022-09-22 DIAGNOSIS — H35033 Hypertensive retinopathy, bilateral: Secondary | ICD-10-CM

## 2022-09-22 DIAGNOSIS — H43813 Vitreous degeneration, bilateral: Secondary | ICD-10-CM

## 2022-10-31 ENCOUNTER — Ambulatory Visit: Admit: 2022-10-31 | Discharge status: Absent without leave | Payer: HMO

## 2022-11-26 ENCOUNTER — Ambulatory Visit: Payer: Self-pay

## 2022-11-26 ENCOUNTER — Ambulatory Visit
Admission: EM | Admit: 2022-11-26 | Discharge: 2022-11-26 | Disposition: A | Discharge status: Home / Self Care | Payer: PPO | Attending: Urgent Care | Admitting: Urgent Care

## 2022-11-26 DIAGNOSIS — J019 Acute sinusitis, unspecified: Secondary | ICD-10-CM | POA: Diagnosis not present

## 2022-11-26 DIAGNOSIS — B9689 Other specified bacterial agents as the cause of diseases classified elsewhere: Secondary | ICD-10-CM

## 2022-11-26 DIAGNOSIS — J45901 Unspecified asthma with (acute) exacerbation: Secondary | ICD-10-CM | POA: Diagnosis not present

## 2022-11-26 MED ORDER — AMOXICILLIN-POT CLAVULANATE 875-125 MG PO TABS: 1.0000 | ORAL_TABLET | Freq: Two times a day (BID) | ORAL | 0 refills | Status: AC

## 2022-11-26 MED ORDER — PREDNISONE 20 MG PO TABS: 60.0000 mg | ORAL_TABLET | Freq: Every day | ORAL | 0 refills | Status: AC

## 2022-11-26 NOTE — Discharge Instructions (Signed)
Follow up here or with your primary care provider if your symptoms are worsening or not improving with treatment.     

## 2022-11-26 NOTE — ED Triage Notes (Signed)
Pt. Presents to UC w/ c/o chest congestion, nasal congestion and watery eyes for the past month.

## 2022-11-26 NOTE — ED Provider Notes (Signed)
Stephen Shelton    CSN: [id number] Arrival date & time: 11/26/22  7829      History   Chief Complaint Chief Complaint  Patient presents with   chest congestion    Nasal Congestion    HPI Stephen Shelton is a 67 y.o. male.   HPI  Patient presents to urgent care with complaint of chest congestion, nasal congestion, watery eyes x 1 month.  PMH significant for diabetes mellitus, CAD, hypertension, cigarette smoker, quit 1 year ago after 16.5 total pack years.  He endorses severe sinus pain and pressure as his most significant symptom.  He also states he has wheezing with his breathing.  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   CAD (coronary artery disease) 11/25/2021   NSTEMI (non-ST elevated myocardial infarction) ([place]) 11/24/2021   ACS (acute coronary syndrome) ([place]) 11/24/2021   HTN (hypertension)    Acute midline thoracic back pain    HLD (hyperlipidemia)    UTI (urinary tract infection) 06/14/2021   Microalbuminuria 12/29/2020   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) ([place]) 08/27/2020   Aortic atherosclerosis ([place]) 08/14/2020   Pancreatic insufficiency 06/29/2020   Loose stools 06/11/2020   Health care maintenance 05/13/2019   Groin pain 09/12/2018   Acute cystitis without hematuria 11/01/2016   Advance care planning 03/20/2015   Colon cancer screening 09/09/2012   AK (actinic keratosis) 06/02/2012   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   Diabetes mellitus with microalbuminuria ([place]) 02/08/2007   Low HDL (under 40) 02/08/2007   SMOKER  02/08/2007    Past Surgical History:  Procedure Laterality Date   APPENDECTOMY     CORONARY ARTERY BYPASS GRAFT Right 11/25/2021   Procedure: CORONARY ARTERY BYPASS GRAFTING (CABG) X 4  ON PUMP USING LEFT INTERNAL MAMMARY ARTERY AND RIGHT ENDOSCOPIC GREATER SAPHENOUS VEIN CONDUITS;  Surgeon: Melrose Nakayama, MD;  Location: [place];  Service: Open Heart Surgery;  Laterality: Right;  Right radial artery harvest   ENDOVEIN HARVEST OF GREATER SAPHENOUS VEIN Right 11/25/2021   Procedure: ENDOVEIN HARVEST OF GREATER SAPHENOUS VEIN;  Surgeon: Melrose Nakayama, MD;  Location: [place];  Service: Open Heart Surgery;  Laterality: Right;   LEFT HEART CATH AND CORONARY ANGIOGRAPHY N/A 11/24/2021   Procedure: LEFT HEART CATH AND CORONARY ANGIOGRAPHY;  Surgeon: Nigel Mormon, MD;  Location: [place] [facility];  Service: Cardiovascular;  Laterality: N/A;   septic arthritis shoulder         Home Medications    Prior to Admission medications   Medication Sig Start Date End Date Taking? Authorizing Provider  aspirin 81 MG EC tablet Take 1 tablet (81 mg total) by mouth daily. Swallow whole. 03/29/22   Tolia, Sunit, DO  atorvastatin (LIPITOR) 80 MG tablet Take 1 tablet (80 mg total) by mouth daily. 03/29/22   Tolia, Sunit, DO  clopidogrel (PLAVIX) 75 MG tablet Take 1 tablet (75 mg total) by mouth daily. 03/29/22   Terri Skains, Sunit, DO  Continuous Blood Gluc Receiver (DEXCOM G7 RECEIVER) DEVI Use  to check blood sugar daily. Dx E10.42 05/18/22   Tonia Ghent, MD  Continuous Blood Gluc Sensor (DEXCOM G7 SENSOR) MISC Use to check blood sugar daily. Dx E10.42 05/18/22   Tonia Ghent, MD  CREON [phone] units CPEP capsule TAKE 1-2 CAPSULES BY MOUTH 3 TIMES DAILY WITH MEALS.MAY ALSO TAKE 1 CAPSULE AS NEEDED W/ SNACKS 03/30/22   Tonia Ghent, MD  lisinopril (ZESTRIL) 10 MG tablet Take 1 tablet (10 mg total) by mouth every morning. 07/28/22 07/23/23  Tolia, Sunit, DO  Metoprolol Tartrate 37.5 MG TABS Take 1  tablet by mouth 2 (two) times daily. 03/29/22   Tolia, Sunit, DO  Multiple Vitamin (MULTIVITAMIN) tablet Take 1 tablet by mouth daily.    [provider]  nitroGLYCERIN (NITROSTAT) 0.4 MG SL tablet Place 1 tablet (0.4 mg total) under the tongue every 5 (five) minutes as needed for chest pain. 12/07/21   Tonia Ghent, MD    Family History Family History  Problem Relation Age of Onset   Cancer Mother        multiple myeloma   Diabetes Father    Autoimmune disease Father        liver and renal disease   Heart attack Brother    Diabetes Maternal Grandfather    Colon cancer Neg Hx    Prostate cancer Neg Hx     Social History Social History   Tobacco Use   Smoking status: Former    Packs/day: 0.50    Years: 33.00    Total pack years: 16.50    Types: Cigarettes    Quit date: 11/25/2021    Years since quitting: 1.0   Smokeless tobacco: Never   Tobacco comments:    "had a few since surgery"  Vaping Use   Vaping Use: Never used  Substance Use Topics   Alcohol use: Not Currently    Comment: rarely   Drug use: No     Allergies   Metformin and related and Sulfa antibiotics   Review of Systems Review of Systems   Physical Exam Triage Vital Signs ED Triage Vitals [11/26/22 0950]  Enc Vitals Group     BP 137/84     Pulse Rate 73     Resp 16     Temp 97.8 F (36.6 C)     Temp src      SpO2 94 %     Weight      Height      Head Circumference      Peak Flow      Pain Score 0     Pain Loc      Pain Edu?      Excl. in [place]?    No data found.  Updated Vital Signs BP 137/84   Pulse 73   Temp 97.8 F (36.6 C)   Resp 16   SpO2 94%   Visual Acuity Right Eye Distance:   Left Eye Distance:   Bilateral Distance:    Right Eye Near:   Left Eye Near:    Bilateral Near:     Physical Exam Vitals reviewed.  Constitutional:      Appearance: Normal appearance. He is ill-appearing.  Cardiovascular:     Rate and Rhythm: Normal rate and regular rhythm.      Pulses: Normal pulses.     Heart sounds: Normal heart sounds.  Pulmonary:     Effort: Pulmonary effort is normal.     Breath sounds: Wheezing present. No  rhonchi.  Skin:    General: Skin is warm and dry.  Neurological:     General: No focal deficit present.     Mental Status: He is alert and oriented to person, place, and time.  Psychiatric:        Mood and Affect: Mood normal.        Behavior: Behavior normal.      UC Treatments / Results  Labs (all labs ordered are listed, but only abnormal results are displayed) Labs Reviewed - No data to display  EKG   Radiology No results found.  Procedures Procedures (including critical care time)  Medications Ordered in UC Medications - No data to display  Initial Impression / Assessment and Plan / UC Course  I have reviewed the triage vital signs and the nursing notes.  Pertinent labs & imaging results that were available during my care of the patient were reviewed by me and considered in my medical decision making (see chart for details).   Patient is afebrile here without recent antipyretics. Satting well on room air. Overall is ill appearing, well hydrated, without respiratory distress. Pulmonary exam is remarkable for wheezing.  Bilateral maxillary sinus tenderness is present.  Suspect acute bacterial sinusitis secondary to past viral URI.  Wheezing is likely reactive airway secondary to past tobacco use and possibly COPD though this is not a diagnosis on his chart.  Will treat the patient with a course of Augmentin which she states he has tolerated in the past as well as prednisone which he has never used.  We spent a few minutes talking about the potential side effects of prednisone and he seems to be aware of them.  Final Clinical Impressions(s) / UC Diagnoses   Final diagnoses:  None   Discharge Instructions   None    ED Prescriptions   None    PDMP not reviewed this encounter.   Rose Phi,  [place] 11/26/22 1006

## 2022-12-09 LAB — COLOGUARD: COLOGUARD: POSITIVE — AB

## 2022-12-10 ENCOUNTER — Other Ambulatory Visit: Payer: Self-pay | Admitting: Family Medicine

## 2022-12-10 DIAGNOSIS — R195 Other fecal abnormalities: Secondary | ICD-10-CM

## 2022-12-17 ENCOUNTER — Telehealth: Payer: Self-pay | Admitting: Family Medicine

## 2022-12-17 ENCOUNTER — Encounter: Payer: Self-pay | Admitting: Gastroenterology

## 2022-12-17 NOTE — Telephone Encounter (Signed)
Patient called and stated if he can get a full lipid panel to draw labs. Call back number [phone].

## 2022-12-17 NOTE — Telephone Encounter (Signed)
Looks like patient is due for medicare wellness in may; do you want to wait to do labs then or okay to do them now?

## 2022-12-19 ENCOUNTER — Other Ambulatory Visit: Payer: Self-pay | Admitting: Family Medicine

## 2022-12-19 DIAGNOSIS — E139 Other specified diabetes mellitus without complications: Secondary | ICD-10-CM

## 2022-12-19 NOTE — Telephone Encounter (Signed)
I would get all of the labs done at a fasting lab visit and not just get his lipids drawn.  I put in all of the orders.  I think he can go ahead and get the labs drawn in the near future if he prefers.

## 2022-12-20 NOTE — Telephone Encounter (Signed)
Okay to go ahead and do labs when patient can. Also, please schedule patient medicare wellness with Dr. Damita Dunnings in may.

## 2022-12-22 ENCOUNTER — Encounter (INDEPENDENT_AMBULATORY_CARE_PROVIDER_SITE_OTHER): Payer: PPO | Admitting: Ophthalmology

## 2022-12-22 ENCOUNTER — Other Ambulatory Visit (INDEPENDENT_AMBULATORY_CARE_PROVIDER_SITE_OTHER): Payer: PPO

## 2022-12-22 DIAGNOSIS — H35033 Hypertensive retinopathy, bilateral: Secondary | ICD-10-CM

## 2022-12-22 DIAGNOSIS — I10 Essential (primary) hypertension: Secondary | ICD-10-CM | POA: Diagnosis not present

## 2022-12-22 DIAGNOSIS — D3132 Benign neoplasm of left choroid: Secondary | ICD-10-CM | POA: Diagnosis not present

## 2022-12-22 DIAGNOSIS — E113393 Type 2 diabetes mellitus with moderate nonproliferative diabetic retinopathy without macular edema, bilateral: Secondary | ICD-10-CM

## 2022-12-22 DIAGNOSIS — H43813 Vitreous degeneration, bilateral: Secondary | ICD-10-CM | POA: Diagnosis not present

## 2022-12-22 DIAGNOSIS — E139 Other specified diabetes mellitus without complications: Secondary | ICD-10-CM | POA: Diagnosis not present

## 2022-12-22 LAB — COMPREHENSIVE METABOLIC PANEL
ALT: 18 U/L (ref 0–53)
AST: 13 U/L (ref 0–37)
Albumin: 3.9 g/dL (ref 3.5–5.2)
Alkaline Phosphatase: 79 U/L (ref 39–117)
BUN: 13 mg/dL (ref 6–23)
CO2: 28 mEq/L (ref 19–32)
Calcium: 9 mg/dL (ref 8.4–10.5)
Chloride: 101 mEq/L (ref 96–112)
Creatinine, Ser: 0.73 mg/dL (ref 0.40–1.50)
GFR: 94.71 mL/min (ref >60.00)
Glucose, Bld: 225 mg/dL — ABNORMAL HIGH (ref 70–99)
Potassium: 4.5 mEq/L (ref 3.5–5.1)
Sodium: 136 mEq/L (ref 135–145)
Total Bilirubin: 0.6 mg/dL (ref 0.2–1.2)
Total Protein: 6.1 g/dL (ref 6.0–8.3)

## 2022-12-22 LAB — CBC WITH DIFFERENTIAL/PLATELET
Basophils Absolute: 0 10*3/uL (ref 0.0–0.1)
Basophils Relative: 0.6 % (ref 0.0–3.0)
Eosinophils Absolute: 0.5 10*3/uL (ref 0.0–0.7)
Eosinophils Relative: 5.9 % — ABNORMAL HIGH (ref 0.0–5.0)
HCT: 42.9 % (ref 39.0–52.0)
Hemoglobin: 14.7 g/dL (ref 13.0–17.0)
Lymphocytes Relative: 20.3 % (ref 12.0–46.0)
Lymphs Abs: 1.7 10*3/uL (ref 0.7–4.0)
MCHC: 34.3 g/dL (ref 30.0–36.0)
MCV: 83.9 fl (ref 78.0–100.0)
Monocytes Absolute: 0.7 10*3/uL (ref 0.1–1.0)
Monocytes Relative: 8 % (ref 3.0–12.0)
Neutro Abs: 5.5 10*3/uL (ref 1.4–7.7)
Neutrophils Relative %: 65.2 % (ref 43.0–77.0)
Platelets: 211 10*3/uL (ref 150.0–400.0)
RBC: 5.11 Mil/uL (ref 4.22–5.81)
RDW: 15 % (ref 11.5–15.5)
WBC: 8.4 10*3/uL (ref 4.0–10.5)

## 2022-12-22 LAB — LIPID PANEL
Cholesterol: 85 mg/dL (ref 0–200)
HDL: 34 mg/dL — ABNORMAL LOW (ref >39.00)
LDL Cholesterol: 36 mg/dL (ref 0–99)
NonHDL: 50.91
Total CHOL/HDL Ratio: 2
Triglycerides: 75 mg/dL (ref 0.0–149.0)
VLDL: 15 mg/dL (ref 0.0–40.0)

## 2022-12-22 LAB — MICROALBUMIN / CREATININE URINE RATIO
Creatinine,U: 224 mg/dL
Microalb Creat Ratio: 2.2 mg/g (ref 0.0–30.0)
Microalb, Ur: 5 mg/dL — ABNORMAL HIGH (ref 0.0–1.9)

## 2022-12-22 LAB — HEMOGLOBIN A1C: Hgb A1c MFr Bld: 9.7 % — ABNORMAL HIGH (ref 4.6–6.5)

## 2023-01-07 ENCOUNTER — Encounter: Payer: Self-pay | Admitting: Gastroenterology

## 2023-01-07 ENCOUNTER — Telehealth: Payer: Self-pay

## 2023-01-07 ENCOUNTER — Ambulatory Visit: Payer: PPO | Admitting: Gastroenterology

## 2023-01-07 VITALS — BP 138/70 | HR 80 | Ht 69.0 in | Wt 223.5 lb

## 2023-01-07 DIAGNOSIS — R195 Other fecal abnormalities: Secondary | ICD-10-CM | POA: Insufficient documentation

## 2023-01-07 DIAGNOSIS — K8681 Exocrine pancreatic insufficiency: Secondary | ICD-10-CM | POA: Diagnosis not present

## 2023-01-07 DIAGNOSIS — Z7902 Long term (current) use of antithrombotics/antiplatelets: Secondary | ICD-10-CM | POA: Insufficient documentation

## 2023-01-07 DIAGNOSIS — K529 Noninfective gastroenteritis and colitis, unspecified: Secondary | ICD-10-CM | POA: Diagnosis not present

## 2023-01-07 MED ORDER — NA SULFATE-K SULFATE-MG SULF 17.5-3.13-1.6 GM/177ML PO SOLN: 1.0000 | Freq: Once | ORAL | 0 refills | Status: AC

## 2023-01-07 NOTE — Patient Instructions (Signed)
You have been scheduled for a colonoscopy. Please follow written instructions given to you at your visit today.  Please pick up your prep supplies at the pharmacy within the next 1-3 days. If you use inhalers (even only as needed), please bring them with you on the day of your procedure.   Due to recent changes in healthcare laws, you may see the results of your imaging and laboratory studies on MyChart before your provider has had a chance to review them.  We understand that in some cases there may be results that are confusing or concerning to you. Not all laboratory results come back in the same time frame and the provider may be waiting for multiple results in order to interpret others.  Please give us 48 hours in order for your provider to thoroughly review all the results before contacting the office for clarification of your results.    It was a pleasure to see you today!  Thank you for trusting me with your gastrointestinal care!       

## 2023-01-07 NOTE — Progress Notes (Signed)
01/07/2023 Stephen Shelton [id number] 1956-04-13   HISTORY OF PRESENT ILLNESS: This is a 67 year old male who is new to our office.  He has been referred here by his PCP, Dr. Damita Dunnings, in order to discuss colonoscopy because of positive Cologuard result.  Tells me he had a colonoscopy back in the 1990s, but none since then.  Had a positive Cologuard in January.  He denies seeing any blood in his stools.  He does report chronically loose stools.  He says usually he will have 2 or 3 loose stools back to back in the mornings and then usually fine throughout the day.  He had a low pancreatic fecal elastase at 43 back in July 2021 and has been on Creon 72,000 units with meals and 36,000 units with snacks.  Says that he did not really notice any improvement with taking those so he slacked off of them for a while.  He says that he has gotten back to taking them just this week.  He is on Plavix for history of coronary artery disease with CABG in January 2023.  That is controlled by Dr. Steele Berg his cardiologist.   Past Medical History:  Diagnosis Date   Coronary artery disease    Diabetes mellitus ([place])    Hyperlipidemia    Hypertension    Septic arthritis ([place])    R shoulder, history of   Smoker    Past Surgical History:  Procedure Laterality Date   APPENDECTOMY     CORONARY ARTERY BYPASS GRAFT Right 11/25/2021   Procedure: CORONARY ARTERY BYPASS GRAFTING (CABG) X 4  ON PUMP USING LEFT INTERNAL MAMMARY ARTERY AND RIGHT ENDOSCOPIC GREATER SAPHENOUS VEIN CONDUITS;  Surgeon: Melrose Nakayama, MD;  Location: [facility];  Service: Open Heart Surgery;  Laterality: Right;  Right radial artery harvest   ENDOVEIN HARVEST OF GREATER SAPHENOUS VEIN Right 11/25/2021   Procedure: ENDOVEIN HARVEST OF GREATER SAPHENOUS VEIN;  Surgeon: Melrose Nakayama, MD;  Location: [facility];  Service: Open Heart Surgery;  Laterality: Right;   LEFT HEART CATH AND CORONARY ANGIOGRAPHY N/A 11/24/2021   Procedure: LEFT HEART  CATH AND CORONARY ANGIOGRAPHY;  Surgeon: Nigel Mormon, MD;  Location: [facility];  Service: Cardiovascular;  Laterality: N/A;   septic arthritis shoulder Right     reports that he quit smoking about 13 months ago. His smoking use included cigarettes. He has a 16.50 pack-year smoking history. He has never used smokeless tobacco. He reports that he does not currently use alcohol. He reports that he does not use drugs. family history includes Autoimmune disease in his father; Cancer in his mother; Colon polyps in his brother, father, and mother; Diabetes in his father and maternal grandfather; Heart attack in his brother. Allergies  Allergen Reactions   Metformin And Related Other (See Comments)    abd pain with med use   Sulfa Antibiotics Other (See Comments)    Lightheaded, intolerant, "felt sick"      Outpatient Encounter Medications as of 01/07/2023  Medication Sig   aspirin 81 MG EC tablet Take 1 tablet (81 mg total) by mouth daily. Swallow whole.   atorvastatin (LIPITOR) 80 MG tablet Take 1 tablet (80 mg total) by mouth daily.   clopidogrel (PLAVIX) 75 MG tablet Take 1 tablet (75 mg total) by mouth daily.   Continuous Blood Gluc Receiver (DEXCOM G7 RECEIVER) DEVI Use to check blood sugar daily. Dx E10.42   Continuous Blood Gluc Sensor ([place]) MISC  Use to check blood sugar daily. Dx E10.42   CREON 36000-114000 units CPEP capsule TAKE 1-2 CAPSULES BY MOUTH 3 TIMES DAILY WITH MEALS.MAY ALSO TAKE 1 CAPSULE AS NEEDED W/ SNACKS   lisinopril (ZESTRIL) 10 MG tablet Take 1 tablet (10 mg total) by mouth every morning.   Metoprolol Tartrate 37.5 MG TABS Take 1 tablet by mouth 2 (two) times daily.   Multiple Vitamin (MULTIVITAMIN) tablet Take 1 tablet by mouth daily.   nitroGLYCERIN (NITROSTAT) 0.4 MG SL tablet Place 1 tablet (0.4 mg total) under the tongue every 5 (five) minutes as needed for chest pain.   No facility-administered encounter medications on file as of  01/07/2023.     REVIEW OF SYSTEMS  : All other systems reviewed and negative except where noted in the History of Present Illness.   PHYSICAL EXAM: BP 138/70 (BP Location: Left Arm, Patient Position: Sitting, Cuff Size: Normal)   Pulse 80   Ht 5' 9"$  (1.753 m) Comment: height meadured without shoes  Wt 223 lb 8 oz (101.4 kg)   BMI 33.01 kg/m  General: Well developed white male in no acute distress Head: Normocephalic and atraumatic Eyes:  Sclerae anicteric, conjunctiva pink. Ears: Normal auditory acuity Lungs: Clear throughout to auscultation; no W/R/R. Heart: Regular rate and rhythm; no M/R/G Abdomen: Soft, non-distended.  BS present.  Non-tender. Rectal:  Will be done at the time of colonoscopy. Musculoskeletal: Symmetrical with no gross deformities  Skin: No lesions on visible extremities Extremities: No edema  Neurological: Alert oriented x 4, grossly non-focal Psychological:  Alert and cooperative. Normal mood and affect  ASSESSMENT AND PLAN: *Positive Cologuard:  Last colonoscopy was in the 1990s.  Will schedule for colonoscopy with Dr. Silverio Decamp. *Chronic loose stools with low pancreatic fecal elastase at 43 indicating likely component of EPI back in July 2021.  Has been on Creon 72,000 units with meals and 36,000 units with snacks.  He says he took it religiously for a while, but has not really been taking it like he should be recently.  He did try to get back with it this week.  Reports no significant improvement even when he was taking it regularly.  I told him to try it again at the current dose, but if after couple of weeks he has not had improvement then he could double the dose and take 4 with meals and 2 with snacks for a while to see if that helps. *Chronic antiplatelet use with Plavix for history of CAD with CABG in 11/2021:  Hold Plavix for 5 days before procedure - will instruct when and how to resume after procedure. Risks and benefits of procedure including bleeding,  perforation, infection, missed lesions, medication reactions and possible hospitalization or surgery if complications occur explained. Additional rare but real risk of cardiovascular event such as heart attack or ischemia/infarct of other organs off of Plavix explained and need to seek urgent help if this occurs. Will communicate by phone or EMR with patient's prescribing provider, Dr. Steele Berg, to confirm that holding Plavix is reasonable in this case.     CC:  Tonia Ghent, MD

## 2023-01-07 NOTE — Telephone Encounter (Signed)
[facility] Pre-operative Risk Assessment     Request for surgical clearance:     Endoscopy Procedure  What type of surgery is being performed?     Colonoscopy  When is this surgery scheduled?     02/01/23  What type of clearance is required ?   Pharmacy  Are there any medications that need to be held prior to surgery and how long? Plavix & 5 days  Practice name and name of physician performing surgery?      [facility]  What is your office phone and fax number?      Phone- [phone]  [phone]  Anesthesia type (None, local, MAC, general) ?       MAC

## 2023-01-07 NOTE — Telephone Encounter (Signed)
This is not our patient. Please direct questions regarding clearance and medication holds to [facility].   Ledora Bottcher, PA-C 01/07/2023, 1:33 PM [address] [place], [place] [address]

## 2023-01-11 ENCOUNTER — Ambulatory Visit: Payer: PPO | Admitting: Family Medicine

## 2023-01-14 ENCOUNTER — Ambulatory Visit (INDEPENDENT_AMBULATORY_CARE_PROVIDER_SITE_OTHER): Payer: PPO | Admitting: Family Medicine

## 2023-01-14 ENCOUNTER — Encounter: Payer: Self-pay | Admitting: Family Medicine

## 2023-01-14 VITALS — BP 132/64 | HR 64 | Temp 98.0°F | Ht 69.0 in | Wt 226.0 lb

## 2023-01-14 DIAGNOSIS — E785 Hyperlipidemia, unspecified: Secondary | ICD-10-CM

## 2023-01-14 DIAGNOSIS — E1129 Type 2 diabetes mellitus with other diabetic kidney complication: Secondary | ICD-10-CM

## 2023-01-14 DIAGNOSIS — Z1211 Encounter for screening for malignant neoplasm of colon: Secondary | ICD-10-CM | POA: Diagnosis not present

## 2023-01-14 DIAGNOSIS — L570 Actinic keratosis: Secondary | ICD-10-CM | POA: Diagnosis not present

## 2023-01-14 DIAGNOSIS — R809 Proteinuria, unspecified: Secondary | ICD-10-CM

## 2023-01-14 NOTE — Progress Notes (Unsigned)
Diabetes:  No meds for DM2.   Hypoglycemic episodes: no Hyperglycemic episodes: no Feet problems: no Blood Sugars averaging: ~110 in the AMs, up to 125.  Usually 130s-140s in the PMs eye exam within last year: f/u pending.  Sugar improved with diet changes in the last 3 weeks.   He had been eating more cards prior to making changes in the last 3 weeks.   Sugars have improved with extra activity.    Taking creon.  Had GI f/u in the meantime, d/w pt about dosing.    Cologuard pos, d/w pt.    D/w pt about skin clinic follow up.    Meds, vitals, and allergies reviewed.   ROS: Per HPI unless specifically indicated in ROS section   GEN: nad, alert and oriented HEENT: ncat NECK: supple w/o LA CV: rrr. PULM: ctab, no inc wob ABD: soft, +bs EXT: no edema SKIN: no acute rash AK changes on the L ear, d/w pt.    Need to update Dr. Terri Skains with cards.

## 2023-01-14 NOTE — Patient Instructions (Addendum)
Let me know if you have trouble seeing dermatology.  If your sugars are still 100-140, then don't change your meds.   Recheck A1c in about 3-4 months at a physical.   Take care.  Glad to see you.

## 2023-01-16 NOTE — Assessment & Plan Note (Signed)
See above

## 2023-01-16 NOTE — Assessment & Plan Note (Signed)
Discussed with patient about dermatology follow-up.  He can update me if he has trouble getting scheduled.

## 2023-01-16 NOTE — Assessment & Plan Note (Signed)
Lipids controlled on recent check.  Discussed with patient.  Will update cardiology as FYI.

## 2023-01-16 NOTE — Assessment & Plan Note (Signed)
Sugar improved with diet changes in the last 3 weeks.   He had been eating more cards prior to making changes in the last 3 weeks.   Sugars have improved with extra activity.   Since his sugars have improved in the meantime we opted not to change his medication. If sugars are still 100-140, then no change in meds. Recheck A1c in about 3-4 months at a physical.   Continue work on diet and exercise.  Discussed that he would likely need medication at some point in the future.

## 2023-01-17 ENCOUNTER — Encounter: Payer: Self-pay | Admitting: Cardiology

## 2023-01-17 NOTE — Telephone Encounter (Signed)
Stephen Shelton,  Please review his preprocedural risk stratification and instructions with the patient as outlined per the letter.  [name], DO, [facility]

## 2023-01-17 NOTE — Telephone Encounter (Signed)
Contacted pt and patient is aware and understanding to hold his aspirin and Plavix 5 days prior to his procedure.

## 2023-01-17 NOTE — Progress Notes (Signed)
Contacted pt and patient is aware and understanding to hold aspirin and Plavix 5 day prior to his procedure.

## 2023-01-18 NOTE — Telephone Encounter (Signed)
Spoke with patient, he acknowledged understanding and agreed to hold the medication 5 day prior.

## 2023-01-24 ENCOUNTER — Encounter: Payer: Self-pay | Admitting: Gastroenterology

## 2023-02-01 ENCOUNTER — Encounter: Payer: Self-pay | Admitting: Gastroenterology

## 2023-02-01 ENCOUNTER — Ambulatory Visit (AMBULATORY_SURGERY_CENTER): Payer: PPO | Admitting: Gastroenterology

## 2023-02-01 VITALS — BP 132/75 | HR 68 | Temp 97.8°F | Resp 11 | Ht 69.0 in | Wt 223.0 lb

## 2023-02-01 DIAGNOSIS — K6289 Other specified diseases of anus and rectum: Secondary | ICD-10-CM | POA: Diagnosis not present

## 2023-02-01 DIAGNOSIS — R195 Other fecal abnormalities: Secondary | ICD-10-CM

## 2023-02-01 DIAGNOSIS — D125 Benign neoplasm of sigmoid colon: Secondary | ICD-10-CM

## 2023-02-01 DIAGNOSIS — K635 Polyp of colon: Secondary | ICD-10-CM | POA: Diagnosis not present

## 2023-02-01 DIAGNOSIS — K6282 Dysplasia of anus: Secondary | ICD-10-CM | POA: Diagnosis not present

## 2023-02-01 DIAGNOSIS — K639 Disease of intestine, unspecified: Secondary | ICD-10-CM

## 2023-02-01 DIAGNOSIS — R197 Diarrhea, unspecified: Secondary | ICD-10-CM | POA: Diagnosis not present

## 2023-02-01 DIAGNOSIS — D123 Benign neoplasm of transverse colon: Secondary | ICD-10-CM

## 2023-02-01 DIAGNOSIS — K529 Noninfective gastroenteritis and colitis, unspecified: Secondary | ICD-10-CM

## 2023-02-01 DIAGNOSIS — Z1211 Encounter for screening for malignant neoplasm of colon: Secondary | ICD-10-CM | POA: Diagnosis not present

## 2023-02-01 DIAGNOSIS — D120 Benign neoplasm of cecum: Secondary | ICD-10-CM | POA: Diagnosis not present

## 2023-02-01 MED ORDER — SODIUM CHLORIDE 0.9 % IV SOLN: 500.0000 mL | Freq: Once | INTRAVENOUS | Status: DC

## 2023-02-01 NOTE — Progress Notes (Signed)
Please refer to office visit note 01/07/23 by Alonza Bogus. No additional changes in H&P Patient is appropriate for planned procedure(s) and anesthesia in an ambulatory setting  K. Denzil Magnuson , MD [phone]

## 2023-02-01 NOTE — Progress Notes (Signed)
Called to room to assist during endoscopic procedure.  Patient ID and intended procedure confirmed with present staff. Received instructions for my participation in the procedure from the performing physician.  

## 2023-02-01 NOTE — Op Note (Addendum)
Stephen Shelton Patient Name: Stephen Shelton Procedure Date: 02/01/2023 3:11 PM MRN: [id number] Endoscopist: Mauri Pole , MD, [id number] Age: 67 Referring MD:  Date of Birth: 1956/01/28 Gender: Male Account #: [id number] Procedure:                Colonoscopy Indications:              Screening for colorectal malignant neoplasm Medicines:                Monitored Anesthesia Care Procedure:                Pre-Anesthesia Assessment:                           - Prior to the procedure, a History and Physical                            was performed, and patient medications and                            allergies were reviewed. The patient's tolerance of                            previous anesthesia was also reviewed. The risks                            and benefits of the procedure and the sedation                            options and risks were discussed with the patient.                            All questions were answered, and informed consent                            was obtained. Prior Anticoagulants: The patient has                            taken no anticoagulant or antiplatelet agents. ASA                            Grade Assessment: II - A patient with mild systemic                            disease. After reviewing the risks and benefits,                            the patient was deemed in satisfactory condition to                            undergo the procedure.                           After obtaining informed consent, the colonoscope  was passed under direct vision. Throughout the                            procedure, the patient's blood pressure, pulse, and                            oxygen saturations were monitored continuously. The                            [id number]-HQ190L Colonoscope [id number] was introduced                            through the anus and advanced to the the cecum,                            identified by  appendiceal orifice and ileocecal                            valve. The colonoscopy was performed without                            difficulty. The patient tolerated the procedure                            well. The quality of the bowel preparation was                            good. The ileocecal valve, appendiceal orifice, and                            rectum were photographed. Scope In: 3:23:53 PM Scope Out: 3:50:38 PM Scope Withdrawal Time: 0 hours 22 minutes 59 seconds  Total Procedure Duration: 0 hours 26 minutes 45 seconds  Findings:                 The perianal and digital rectal examinations were                            normal.                           A patchy area of mildly nodular mucosa was found in                            the cecum and at the appendiceal orifice. Biopsies                            were taken with a cold forceps for histology.                           Two sessile polyps were found in the sigmoid colon                            and transverse colon. The polyps were 4 to 7 mm in  size. These polyps were removed with a cold snare.                            Resection and retrieval were complete.                           A 9 mm polypoid lesion was found in the distal                            rectum. The lesion was semi-pedunculated. No                            bleeding was present. Biopsies were taken with a                            cold forceps for histology.                           Scattered large-mouthed, medium-mouthed and                            small-mouthed diverticula were found in the sigmoid                            colon, descending colon, transverse colon and                            ascending colon.                           Non-bleeding external and internal hemorrhoids were                            found during retroflexion. The hemorrhoids were                             medium-sized. Complications:            No immediate complications. Estimated Blood Loss:     Estimated blood loss was minimal. Impression:               - Nodular mucosa in the cecum and at the                            appendiceal orifice. Biopsied.                           - Two 4 to 7 mm polyps in the sigmoid colon and in                            the transverse colon, removed with a cold snare.                            Resected and retrieved.                           -  Likely benign polypoid lesion in the distal                            rectum. Biopsied.                           - Diverticulosis in the sigmoid colon, in the                            descending colon, in the transverse colon and in                            the ascending colon.                           - Non-bleeding external and internal hemorrhoids.                           - The GI Genius (intelligent endoscopy module),                            computer-aided polyp detection system powered by AI                            was utilized to detect colorectal polyps through                            enhanced visualization during colonoscopy. Recommendation:           - Patient has a contact number available for                            emergencies. The signs and symptoms of potential                            delayed complications were discussed with the                            patient. Return to normal activities tomorrow.                            Written discharge instructions were provided to the                            patient.                           - Resume previous diet.                           - Continue present medications.                           - Await pathology results.                           - Repeat colonoscopy date  to be determined after                            pending pathology results are reviewed for                            surveillance based on pathology  results.                           - Resume Plavix (clopidogrel) at prior dose                            tomorrow. Refer to managing physician for further                            adjustment of therapy. Mauri Pole, MD 02/01/2023 3:58:13 PM This report has been signed electronically.

## 2023-02-01 NOTE — Progress Notes (Signed)
Sedate, gd SR, tolerated procedure well, VSS, report to RN 

## 2023-02-01 NOTE — Patient Instructions (Signed)
Please read handouts provided. Continue present medications. Await pathology results. Resume Plavix ( clopidogrel ) at prior dose tomorrow.   YOU HAD AN ENDOSCOPIC PROCEDURE TODAY AT [facility]:   Refer to the procedure report that was given to you for any specific questions about what was found during the examination.  If the procedure report does not answer your questions, please call your gastroenterologist to clarify.  If you requested that your care partner not be given the details of your procedure findings, then the procedure report has been included in a sealed envelope for you to review at your convenience later.  YOU SHOULD EXPECT: Some feelings of bloating in the abdomen. Passage of more gas than usual.  Walking can help get rid of the air that was put into your GI tract during the procedure and reduce the bloating. If you had a lower endoscopy (such as a colonoscopy or flexible sigmoidoscopy) you may notice spotting of blood in your stool or on the toilet paper. If you underwent a bowel prep for your procedure, you may not have a normal bowel movement for a few days.  Please Note:  You might notice some irritation and congestion in your nose or some drainage.  This is from the oxygen used during your procedure.  There is no need for concern and it should clear up in a day or so.  SYMPTOMS TO REPORT IMMEDIATELY:  Following lower endoscopy (colonoscopy or flexible sigmoidoscopy):  Excessive amounts of blood in the stool  Significant tenderness or worsening of abdominal pains  Swelling of the abdomen that is new, acute  Fever of 100F or higher  For urgent or emergent issues, a gastroenterologist can be reached at any hour by calling [phone]. Do not use MyChart messaging for urgent concerns.    DIET:  We do recommend a small meal at first, but then you may proceed to your regular diet.  Drink plenty of fluids but you should avoid alcoholic beverages for 24  hours.  ACTIVITY:  You should plan to take it easy for the rest of today and you should NOT DRIVE or use heavy machinery until tomorrow (because of the sedation medicines used during the test).    FOLLOW UP: Our staff will call the number listed on your records the next business day following your procedure.  We will call around 7:15- 8:00 am to check on you and address any questions or concerns that you may have regarding the information given to you following your procedure. If we do not reach you, we will leave a message.     If any biopsies were taken you will be contacted by phone or by letter within the next 1-3 weeks.  Please call us at [phone] if you have not heard about the biopsies in 3 weeks.    SIGNATURES/CONFIDENTIALITY: You and/or your care partner have signed paperwork which will be entered into your electronic medical record.  These signatures attest to the fact that that the information above on your After Visit Summary has been reviewed and is understood.  Full responsibility of the confidentiality of this discharge information lies with you and/or your care-partner.

## 2023-02-02 ENCOUNTER — Telehealth: Payer: Self-pay | Admitting: *Deleted

## 2023-02-02 NOTE — Telephone Encounter (Signed)
  Follow up Call-     02/01/2023    2:37 PM  Call back number  Post procedure Call Back phone  # [phone]  Permission to leave phone message Yes     Patient questions:  Do you have a fever, pain , or abdominal swelling? No. Pain Score  0 *  Have you tolerated food without any problems? Yes.    Have you been able to return to your normal activities? Yes.    Do you have any questions about your discharge instructions: Diet   No. Medications  No. Follow up visit  No.  Do you have questions or concerns about your Care? No.  Actions: * If pain score is 4 or above: No action needed, pain <4.

## 2023-02-04 ENCOUNTER — Encounter: Payer: Self-pay | Admitting: Gastroenterology

## 2023-02-28 DIAGNOSIS — I700 Atherosclerosis of aorta: Secondary | ICD-10-CM | POA: Diagnosis not present

## 2023-02-28 DIAGNOSIS — K6282 Dysplasia of anus: Secondary | ICD-10-CM | POA: Diagnosis not present

## 2023-02-28 DIAGNOSIS — D126 Benign neoplasm of colon, unspecified: Secondary | ICD-10-CM | POA: Diagnosis not present

## 2023-03-08 NOTE — Progress Notes (Signed)
Reviewed and agree with documentation and assessment and plan. K. Veena Drue Camera , MD   

## 2023-03-13 ENCOUNTER — Encounter: Payer: Self-pay | Admitting: Cardiology

## 2023-03-16 ENCOUNTER — Telehealth: Payer: Self-pay

## 2023-03-16 NOTE — Telephone Encounter (Signed)
Spoke with patient about his surgical clearance. Ensured that he understood when to hold plavix and aspirin, and to start it back when his surgeon gives him to okay to do so. He acknowledged that he understood risks, and had no further uestions.

## 2023-03-21 ENCOUNTER — Other Ambulatory Visit: Payer: Self-pay | Admitting: Cardiology

## 2023-03-23 ENCOUNTER — Encounter (INDEPENDENT_AMBULATORY_CARE_PROVIDER_SITE_OTHER): Payer: PPO | Admitting: Ophthalmology

## 2023-03-23 DIAGNOSIS — D3132 Benign neoplasm of left choroid: Secondary | ICD-10-CM | POA: Diagnosis not present

## 2023-03-23 DIAGNOSIS — Z7984 Long term (current) use of oral hypoglycemic drugs: Secondary | ICD-10-CM

## 2023-03-23 DIAGNOSIS — H35033 Hypertensive retinopathy, bilateral: Secondary | ICD-10-CM

## 2023-03-23 DIAGNOSIS — I10 Essential (primary) hypertension: Secondary | ICD-10-CM | POA: Diagnosis not present

## 2023-03-23 DIAGNOSIS — H43813 Vitreous degeneration, bilateral: Secondary | ICD-10-CM | POA: Diagnosis not present

## 2023-03-23 DIAGNOSIS — E113393 Type 2 diabetes mellitus with moderate nonproliferative diabetic retinopathy without macular edema, bilateral: Secondary | ICD-10-CM

## 2023-03-28 ENCOUNTER — Other Ambulatory Visit: Payer: Self-pay | Admitting: Cardiology

## 2023-04-06 ENCOUNTER — Encounter: Payer: Self-pay | Admitting: Family Medicine

## 2023-04-13 ENCOUNTER — Ambulatory Visit: Payer: Self-pay | Admitting: Surgery

## 2023-04-13 DIAGNOSIS — Z01818 Encounter for other preprocedural examination: Secondary | ICD-10-CM

## 2023-04-13 DIAGNOSIS — R739 Hyperglycemia, unspecified: Secondary | ICD-10-CM

## 2023-04-13 NOTE — Progress Notes (Signed)
Sent message, via epic in basket, requesting orders in epic from surgeon.  

## 2023-04-14 NOTE — Progress Notes (Addendum)
PCP - Crawford Givens ,MD [place], [place] Cardiologist - [place], [place], DO lov 09-10-22 EPIC  PPM/ICD -  Device Orders -  Rep Notified -   Chest x-ray - 12-29-21 epic EKG - 09-10-22 epic Stress Test -  ECHO - 11-27-21 epic Cardiac Cath -   Sleep Study -  CPAP -   Fasting Blood Sugar - 130's Checks Blood Sugar __1-2__ times a day  Blood Thinner Instructions:Plavix and asa hold 5 days   last dose 5-28 Aspirin Instructions:  ERAS Protcol - PRE-SURGERY G2-    COVID vaccine -x2  Activity--Able to climb a flight of staird eithout SOB or CP Anesthesia review: CAD,HTN, DM no meds ,CABG   Patient denies shortness of breath, fever, cough and chest pain at PAT appointment   All instructions explained to the patient, with a verbal understanding of the material. Patient agrees to go over the instructions while at home for a better understanding. Patient also instructed to self quarantine after being tested for COVID-19. The opportunity to ask questions was provided.

## 2023-04-14 NOTE — Patient Instructions (Addendum)
SURGICAL WAITING ROOM VISITATION  Patients having surgery or a procedure may have no more than 2 support people in the waiting area - these visitors may rotate.    Children under the age of 69 must have an adult with them who is not the patient.   If the patient needs to stay at the hospital during part of their recovery, the visitor guidelines for inpatient rooms apply.  Pre-op nurse will coordinate an appropriate time for 1 support person to accompany patient in pre-op.  This support person may not rotate.    Please refer to the [facility] website for the visitor guidelines for Inpatients (after your surgery is over and you are in a regular room).       Your procedure is scheduled on: 04-25-23    Report to [facility] Main Entrance    Report to admitting at         [phone]   Call this number if you have problems the morning of surgery [phone]   FOLLOW A CLEAR LIQUID DIET THE DAY OF YOUR BOWEL PREP TO PREVENT DEHYDRATION   After Midnight you may have the following liquids until ___0430___ AM DAY OF SURGERY   THEN NOTHING BY MOUTH  Water Non-Citrus Juices (without pulp, NO RED-Class, White grape, White cranberry) Black Coffee (NO MILK/CREAM OR CREAMERS, sugar ok)  Clear Tea (NO MILK/CREAM OR CREAMERS, sugar ok) regular and decaf                             Plain Jell-O (NO RED)                                           Fruit ices (not with fruit pulp, NO RED)                                     Popsicles (NO RED)                                                               Sports drinks like Gatorade (NO RED)              Drink G2 drinks AT 10:00 PM the night before surgery.        The day of surgery:  Drink ONE (1) Pre-Surgery  G2 at    0400  AM the morning of surgery. Drink in one sitting. Do not sip.  This drink was given to you during your hospital  pre-op appointment visit. Nothing else to drink after completing the  Pre-Surgery  G2.by 0430 am            If you have questions, please contact your surgeon's office.   FOLLOW BOWEL PREP AND ANY ADDITIONAL PRE OP INSTRUCTIONS YOU RECEIVED FROM YOUR SURGEON'S OFFICE!!!     Oral Hygiene is also important to reduce your risk of infection.  Remember - BRUSH YOUR TEETH THE MORNING OF SURGERY WITH YOUR REGULAR TOOTHPASTE  DENTURES WILL BE REMOVED PRIOR TO SURGERY PLEASE DO NOT APPLY "Poly grip" OR ADHESIVES!!!   Do NOT smoke after Midnight   Take these medicines the morning of surgery with A SIP OF WATER: metoprolol,atorvastatin  These are anesthesia recommendations for holding your anticoagulants.  Please contact your prescribing physician to confirm IF it is safe to hold your anticoagulants for this length of time.   Eliquis Apixaban   72 hours   Xarelto Rivaroxaban   72 hours  Plavix Clopidogrel   120 hours  Pletal Cilostazol   120 hours    DO NOT TAKE ANY ORAL DIABETIC MEDICATIONS DAY OF YOUR SURGERY  Bring CPAP mask and tubing day of surgery.                              You may not have any metal on your body including hair pins, jewelry, and body piercing             Do not wear lotions, powders, cologne, or deodorant                Men may shave face and neck.   Do not bring valuables to the hospital. [facility] IS NOT             RESPONSIBLE   FOR VALUABLES.   Contacts, glasses, dentures or bridgework may not be worn into surgery.   Bring small overnight bag day of surgery.   DO NOT BRING YOUR HOME MEDICATIONS TO THE HOSPITAL. PHARMACY WILL DISPENSE MEDICATIONS LISTED ON YOUR MEDICATION LIST TO YOU DURING YOUR ADMISSION IN THE HOSPITAL!    Patients discharged on the day of surgery will not be allowed to drive home.  Someone NEEDS to stay with you for the first 24 hours after anesthesia.   Special Instructions: Bring a copy of your healthcare power of attorney and living will documents the day of surgery if you haven't scanned them  before.              Please read over the following fact sheets you were given: IF YOU HAVE QUESTIONS ABOUT YOUR PRE-OP INSTRUCTIONS PLEASE CALL [phone]    If you test positive for Covid or have been in contact with anyone that has tested positive in the last 10 days please notify you surgeon.    [facility] - Preparing for Surgery Before surgery, you can play an important role.  Because skin is not sterile, your skin needs to be as free of germs as possible.  You can reduce the number of germs on your skin by washing with CHG (chlorahexidine gluconate) soap before surgery.  CHG is an antiseptic cleaner which kills germs and bonds with the skin to continue killing germs even after washing. Please DO NOT use if you have an allergy to CHG or antibacterial soaps.  If your skin becomes reddened/irritated stop using the CHG and inform your nurse when you arrive at Short Stay. Do not shave (including legs and underarms) for at least 48 hours prior to the first CHG shower.  You may shave your face/neck. Please follow these instructions carefully:  1.  Shower with CHG Soap the night before surgery and the  morning of Surgery.  2.  If you choose to wash your hair, wash your hair first as usual with your  normal  shampoo.  3.  After you shampoo, rinse your hair and body thoroughly to remove the  shampoo.                           4.  Use CHG as you would any other liquid soap.  You can apply chg directly  to the skin and wash                       Gently with a scrungie or clean washcloth.  5.  Apply the CHG Soap to your body ONLY FROM THE NECK DOWN.   Do not use on face/ open                           Wound or open sores. Avoid contact with eyes, ears mouth and genitals (private parts).                       Wash face,  Genitals (private parts) with your normal soap.             6.  Wash thoroughly, paying special attention to the area where your surgery  will be performed.  7.  Thoroughly rinse your  body with warm water from the neck down.  8.  DO NOT shower/wash with your normal soap after using and rinsing off  the CHG Soap.                9.  Pat yourself dry with a clean towel.            10.  Wear clean pajamas.            11.  Place clean sheets on your bed the night of your first shower and do not  sleep with pets. Day of Surgery : Do not apply any lotions/deodorants the morning of surgery.  Please wear clean clothes to the hospital/surgery center.  FAILURE TO FOLLOW THESE INSTRUCTIONS MAY RESULT IN THE CANCELLATION OF YOUR SURGERY PATIENT SIGNATURE_________________________________  NURSE SIGNATURE__________________________________  ________________________________________________________________________  Rogelia Mire  An incentive spirometer is a tool that can help keep your lungs clear and active. This tool measures how well you are filling your lungs with each breath. Taking long deep breaths may help reverse or decrease the chance of developing breathing (pulmonary) problems (especially infection) following: A long period of time when you are unable to move or be active. BEFORE THE PROCEDURE  If the spirometer includes an indicator to show your best effort, your nurse or respiratory therapist will set it to a desired goal. If possible, sit up straight or lean slightly forward. Try not to slouch. Hold the incentive spirometer in an upright position. INSTRUCTIONS FOR USE  Sit on the edge of your bed if possible, or sit up as far as you can in bed or on a chair. Hold the incentive spirometer in an upright position. Breathe out normally. Place the mouthpiece in your mouth and seal your lips tightly around it. Breathe in slowly and as deeply as possible, raising the piston or the ball toward the top of the column. Hold your breath for 3-5 seconds or for as long as possible. Allow the piston or ball to fall to the bottom of the column. Remove the mouthpiece from your  mouth and breathe out normally. Rest for a few seconds and repeat Steps 1 through 7 at least  10 times every 1-2 hours when you are awake. Take your time and take a few normal breaths between deep breaths. The spirometer may include an indicator to show your best effort. Use the indicator as a goal to work toward during each repetition. After each set of 10 deep breaths, practice coughing to be sure your lungs are clear. If you have an incision (the cut made at the time of surgery), support your incision when coughing by placing a pillow or rolled up towels firmly against it. Once you are able to get out of bed, walk around indoors and cough well. You may stop using the incentive spirometer when instructed by your caregiver.  RISKS AND COMPLICATIONS Take your time so you do not get dizzy or light-headed. If you are in pain, you may need to take or ask for pain medication before doing incentive spirometry. It is harder to take a deep breath if you are having pain. AFTER USE Rest and breathe slowly and easily. It can be helpful to keep track of a log of your progress. Your caregiver can provide you with a simple table to help with this. If you are using the spirometer at home, follow these instructions: SEEK MEDICAL CARE IF:  You are having difficultly using the spirometer. You have trouble using the spirometer as often as instructed. Your pain medication is not giving enough relief while using the spirometer. You develop fever of 100.5 F (38.1 C) or higher. SEEK IMMEDIATE MEDICAL CARE IF:  You cough up bloody sputum that had not been present before. You develop fever of 102 F (38.9 C) or greater. You develop worsening pain at or near the incision site. MAKE SURE YOU:  Understand these instructions. Will watch your condition. Will get help right away if you are not doing well or get worse. Document Released: 03/21/2007 Document Revised: 01/31/2012 Document Reviewed: 05/22/2007 ExitCare  Patient Information 2014 Exit[employer], [place].   ________________________________________________________________________ WHAT IS A BLOOD TRANSFUSION? Blood Transfusion Information  A transfusion is the replacement of blood or some of its parts. Blood is made up of multiple cells which provide different functions. Red blood cells carry oxygen and are used for blood loss replacement. White blood cells fight against infection. Platelets control bleeding. Plasma helps clot blood. Other blood products are available for specialized needs, such as hemophilia or other clotting disorders. BEFORE THE TRANSFUSION  Who gives blood for transfusions?  Healthy volunteers who are fully evaluated to make sure their blood is safe. This is blood bank blood. Transfusion therapy is the safest it has ever been in the practice of medicine. Before blood is taken from a donor, a complete history is taken to make sure that person has no history of diseases nor engages in risky social behavior (examples are intravenous drug use or sexual activity with multiple partners). The donor's travel history is screened to minimize risk of transmitting infections, such as malaria. The donated blood is tested for signs of infectious diseases, such as HIV and hepatitis. The blood is then tested to be sure it is compatible with you in order to minimize the chance of a transfusion reaction. If you or a relative donates blood, this is often done in anticipation of surgery and is not appropriate for emergency situations. It takes many days to process the donated blood. RISKS AND COMPLICATIONS Although transfusion therapy is very safe and saves many lives, the main dangers of transfusion include:  Getting an infectious disease. Developing a transfusion reaction. This is  an allergic reaction to something in the blood you were given. Every precaution is taken to prevent this. The decision to have a blood transfusion has been considered carefully  by your caregiver before blood is given. Blood is not given unless the benefits outweigh the risks. AFTER THE TRANSFUSION Right after receiving a blood transfusion, you will usually feel much better and more energetic. This is especially true if your red blood cells have gotten low (anemic). The transfusion raises the level of the red blood cells which carry oxygen, and this usually causes an energy increase. The nurse administering the transfusion will monitor you carefully for complications. HOME CARE INSTRUCTIONS  No special instructions are needed after a transfusion. You may find your energy is better. Speak with your caregiver about any limitations on activity for underlying diseases you may have. SEEK MEDICAL CARE IF:  Your condition is not improving after your transfusion. You develop redness or irritation at the intravenous (IV) site. SEEK IMMEDIATE MEDICAL CARE IF:  Any of the following symptoms occur over the next 12 hours: Shaking chills. You have a temperature by mouth above 102 F (38.9 C), not controlled by medicine. Chest, back, or muscle pain. People around you feel you are not acting correctly or are confused. Shortness of breath or difficulty breathing. Dizziness and fainting. You get a rash or develop hives. You have a decrease in urine output. Your urine turns a dark color or changes to pink, red, or brown. Any of the following symptoms occur over the next 10 days: You have a temperature by mouth above 102 F (38.9 C), not controlled by medicine. Shortness of breath. Weakness after normal activity. The white part of the eye turns yellow (jaundice). You have a decrease in the amount of urine or are urinating less often. Your urine turns a dark color or changes to pink, red, or brown. Document Released: 11/05/2000 Document Revised: 01/31/2012 Document Reviewed: 06/24/2008 [facility] Patient Information 2014 [place],  [place].  _______________________________________________________________________

## 2023-04-21 ENCOUNTER — Encounter (HOSPITAL_COMMUNITY): Payer: Self-pay

## 2023-04-21 ENCOUNTER — Other Ambulatory Visit: Payer: Self-pay

## 2023-04-21 ENCOUNTER — Encounter (HOSPITAL_COMMUNITY)
Admission: RE | Admit: 2023-04-21 | Discharge: 2023-04-21 | Disposition: A | Discharge status: Home / Self Care | Payer: PPO | Source: Ambulatory Visit | Attending: Surgery | Admitting: Surgery

## 2023-04-21 VITALS — BP 151/81 | HR 64 | Temp 98.2°F | Resp 16 | Ht 71.0 in | Wt 217.0 lb

## 2023-04-21 DIAGNOSIS — E785 Hyperlipidemia, unspecified: Secondary | ICD-10-CM | POA: Diagnosis not present

## 2023-04-21 DIAGNOSIS — R739 Hyperglycemia, unspecified: Secondary | ICD-10-CM

## 2023-04-21 DIAGNOSIS — K635 Polyp of colon: Secondary | ICD-10-CM | POA: Insufficient documentation

## 2023-04-21 DIAGNOSIS — E1059 Type 1 diabetes mellitus with other circulatory complications: Secondary | ICD-10-CM

## 2023-04-21 DIAGNOSIS — Z951 Presence of aortocoronary bypass graft: Secondary | ICD-10-CM | POA: Diagnosis not present

## 2023-04-21 DIAGNOSIS — Z87891 Personal history of nicotine dependence: Secondary | ICD-10-CM | POA: Insufficient documentation

## 2023-04-21 DIAGNOSIS — I10 Essential (primary) hypertension: Secondary | ICD-10-CM | POA: Insufficient documentation

## 2023-04-21 DIAGNOSIS — I2510 Atherosclerotic heart disease of native coronary artery without angina pectoris: Secondary | ICD-10-CM | POA: Diagnosis not present

## 2023-04-21 DIAGNOSIS — E119 Type 2 diabetes mellitus without complications: Secondary | ICD-10-CM | POA: Insufficient documentation

## 2023-04-21 DIAGNOSIS — I252 Old myocardial infarction: Secondary | ICD-10-CM | POA: Diagnosis not present

## 2023-04-21 DIAGNOSIS — Z01818 Encounter for other preprocedural examination: Secondary | ICD-10-CM | POA: Diagnosis not present

## 2023-04-21 HISTORY — DX: Acute myocardial infarction, unspecified: I21.9

## 2023-04-21 HISTORY — DX: Pneumonia, unspecified organism: J18.9

## 2023-04-21 LAB — CBC WITH DIFFERENTIAL/PLATELET
Abs Immature Granulocytes: 0.04 10*3/uL (ref 0.00–0.07)
Basophils Absolute: 0.1 10*3/uL (ref 0.0–0.1)
Basophils Relative: 1 %
Eosinophils Absolute: 0.4 10*3/uL (ref 0.0–0.5)
Eosinophils Relative: 4 %
HCT: 47.7 % (ref 39.0–52.0)
Hemoglobin: 15.8 g/dL (ref 13.0–17.0)
Immature Granulocytes: 0 %
Lymphocytes Relative: 16 %
Lymphs Abs: 1.7 10*3/uL (ref 0.7–4.0)
MCH: 28.6 pg (ref 26.0–34.0)
MCHC: 33.1 g/dL (ref 30.0–36.0)
MCV: 86.4 fL (ref 80.0–100.0)
Monocytes Absolute: 0.9 10*3/uL (ref 0.1–1.0)
Monocytes Relative: 8 %
Neutro Abs: 7.3 10*3/uL (ref 1.7–7.7)
Neutrophils Relative %: 71 %
Platelets: 215 10*3/uL (ref 150–400)
RBC: 5.52 MIL/uL (ref 4.22–5.81)
RDW: 14.8 % (ref 11.5–15.5)
WBC: 10.4 10*3/uL (ref 4.0–10.5)
nRBC: 0 % (ref 0.0–0.2)

## 2023-04-21 LAB — BASIC METABOLIC PANEL
Anion gap: 6 (ref 5–15)
BUN: 13 mg/dL (ref 8–23)
CO2: 26 mmol/L (ref 22–32)
Calcium: 9.1 mg/dL (ref 8.9–10.3)
Chloride: 107 mmol/L (ref 98–111)
Creatinine, Ser: 0.66 mg/dL (ref 0.61–1.24)
GFR, Estimated: >60 mL/min (ref >60)
Glucose, Bld: 160 mg/dL — ABNORMAL HIGH (ref 70–99)
Potassium: 4.8 mmol/L (ref 3.5–5.1)
Sodium: 139 mmol/L (ref 135–145)

## 2023-04-21 LAB — TYPE AND SCREEN: Antibody Screen: NEGATIVE

## 2023-04-21 LAB — GLUCOSE, CAPILLARY: Glucose-Capillary: 169 mg/dL — ABNORMAL HIGH (ref 70–99)

## 2023-04-22 LAB — HEMOGLOBIN A1C
Hgb A1c MFr Bld: 8.2 % — ABNORMAL HIGH (ref 4.8–5.6)
Mean Plasma Glucose: 189 mg/dL

## 2023-04-22 NOTE — Progress Notes (Signed)
Anesthesia Chart Review   Case: [id number] Date/Time: 04/25/23 0714   Procedures:      LAPAROSCOPIC VS OPEN APPENDECTOMY     POSSIBLE ILEOCECETOMY   Anesthesia type: General   Pre-op diagnosis: COLON POLYP APPENDICEAL ORIFACE   Location: [facility] ROOM 05 / [facility]   Surgeons: Andria Meuse, MD       DISCUSSION:67 y.o. former smoker with h/o HTN, CAD (CABG), DM II (A1C 8.2), colon polyp scheduled for above procedure 04/25/2023 with Dr. Marin Olp.   Per cardiology preoperative evaluation 03/13/2023, "Stephen Shelton is at acceptable risk, from a cardiac standpoint, for his upcoming surgery laparoscopic versus open appendectomy and possible ileocolectomy.     If applicable can hold aspirin and Plavix for 5  day(s) prior to surgery and re-start once appropriate hemostasis is achieved, as per surgeon's recommendations."  Pt reports last dose of Plavix 04/19/2023.    VS: BP (!) 151/81   Pulse 64   Temp 36.8 C (Oral)   Resp 16   Ht 5\' 11"  (1.803 m)   Wt 98.4 kg   SpO2 99%   BMI 30.27 kg/m   PROVIDERS: Joaquim Nam, MD is PCP    LABS:  A1C forwarded to PCP (all labs ordered are listed, but only abnormal results are displayed)  Labs Reviewed  HEMOGLOBIN A1C - Abnormal; Notable for the following components:      Result Value   Hgb A1c MFr Bld 8.2 (*)    All other components within normal limits  BASIC METABOLIC PANEL - Abnormal; Notable for the following components:   Glucose, Bld 160 (*)    All other components within normal limits  GLUCOSE, CAPILLARY - Abnormal; Notable for the following components:   Glucose-Capillary 169 (*)    All other components within normal limits  CBC WITH DIFFERENTIAL/PLATELET  TYPE AND SCREEN     IMAGES:   EKG:   CV: Echo 11/24/2021 1. Left ventricular ejection fraction, by estimation, is 50 to 55%. The  left ventricle has low normal function. The left ventricle has no regional  wall motion abnormalities. There is mild left  ventricular hypertrophy.  Left ventricular diastolic  parameters are consistent with Grade II diastolic dysfunction  (pseudonormalization). Elevated left atrial pressure.   2. Right ventricular systolic function is normal. The right ventricular  size is normal.   3. Left atrial size was mildly dilated.   4. The mitral valve is degenerative. Mild mitral valve regurgitation. No  evidence of mitral stenosis.   5. The aortic valve is tricuspid. Aortic valve regurgitation is not  visualized. Aortic valve sclerosis/calcification is present, without any  evidence of aortic stenosis.   Cardiac Cath 11/24/2021 LM: Normal LAD: Complex mid LAD/Diag2/septal 90% trifurcation lesion ([place] 1,1,1) Lcx: Prox OM2 80% stenosis RCA: Prox CTO. Epicardial right-to-right and left-to-right collaterals, mostly from LAD septals   LVEDP normal LVEF by LV gram 50-55%.  Past Medical History:  Diagnosis Date   Coronary artery disease    Diabetes mellitus (HCC)    diet controlled   Hyperlipidemia    Hypertension    Myocardial infarction [facility])    11-25-2021   CABG   Pneumonia    Septic arthritis (HCC)    R shoulder, history of   Smoker     Past Surgical History:  Procedure Laterality Date   APPENDECTOMY     CORONARY ARTERY BYPASS GRAFT Right 11/25/2021   Procedure: CORONARY ARTERY BYPASS GRAFTING (CABG) X 4  ON PUMP USING LEFT  INTERNAL MAMMARY ARTERY AND RIGHT ENDOSCOPIC GREATER SAPHENOUS VEIN CONDUITS;  Surgeon: Loreli Slot, MD;  Location: [facility] OR;  Service: Open Heart Surgery;  Laterality: Right;  Right radial artery harvest   ENDOVEIN HARVEST OF GREATER SAPHENOUS VEIN Right 11/25/2021   Procedure: ENDOVEIN HARVEST OF GREATER SAPHENOUS VEIN;  Surgeon: Loreli Slot, MD;  Location: [facility] OR;  Service: Open Heart Surgery;  Laterality: Right;   LEFT HEART CATH AND CORONARY ANGIOGRAPHY N/A 11/24/2021   Procedure: LEFT HEART CATH AND CORONARY ANGIOGRAPHY;  Surgeon: Elder Negus, MD;   Location: [facility] INVASIVE CV LAB;  Service: Cardiovascular;  Laterality: N/A;   septic arthritis shoulder Right     MEDICATIONS:  aspirin 81 MG EC tablet   atorvastatin (LIPITOR) 80 MG tablet   clopidogrel (PLAVIX) 75 MG tablet   CREON 36000-114000 units CPEP capsule   lisinopril (ZESTRIL) 10 MG tablet   Metoprolol Tartrate 37.5 MG TABS   Multiple Vitamin (MULTIVITAMIN) tablet   nitroGLYCERIN (NITROSTAT) 0.4 MG SL tablet   No current facility-administered medications for this encounter.   Jodell Cipro Ward, PA-C [facility] Pre-Surgical Testing [phone]

## 2023-04-22 NOTE — Anesthesia Preprocedure Evaluation (Signed)
Anesthesia Evaluation  Patient identified by MRN, date of birth, ID band Patient awake    Reviewed: Allergy & Precautions, H&P , NPO status , Patient's Chart, lab work & pertinent test results  Airway Mallampati: II  TM Distance: >3 FB Neck ROM: Full    Dental  (+) Missing, Chipped, Dental Advisory Given   Pulmonary former smoker   Pulmonary exam normal breath sounds clear to auscultation       Cardiovascular hypertension, + CAD, + Past MI and + CABG  Normal cardiovascular exam Rhythm:Regular Rate:Normal  1. Left ventricular ejection fraction, by estimation, is 50 to 55%. The  left ventricle has low normal function. The left ventricle has no regional  wall motion abnormalities. There is mild left ventricular hypertrophy.  Left ventricular diastolic  parameters are consistent with Grade II diastolic dysfunction  (pseudonormalization). Elevated left atrial pressure.   2. Right ventricular systolic function is normal. The right ventricular  size is normal.   3. Left atrial size was mildly dilated.   4. The mitral valve is degenerative. Mild mitral valve regurgitation. No  evidence of mitral stenosis.   5. The aortic valve is tricuspid. Aortic valve regurgitation is not  visualized. Aortic valve sclerosis/calcification is present, without any  evidence of aortic stenosis.     Neuro/Psych negative neurological ROS  negative psych ROS   GI/Hepatic negative GI ROS, Neg liver ROS,,,  Endo/Other  diabetes, Type 2    Renal/GU negative Renal ROS  negative genitourinary   Musculoskeletal negative musculoskeletal ROS (+)    Abdominal   Peds negative pediatric ROS (+)  Hematology negative hematology ROS (+)   Anesthesia Other Findings   Reproductive/Obstetrics negative OB ROS                             Anesthesia Physical Anesthesia Plan  ASA: 3  Anesthesia Plan: General   Post-op Pain  Management: Tylenol PO (pre-op)*   Induction: Intravenous  PONV Risk Score and Plan: 2 and Ondansetron, Dexamethasone and Treatment may vary due to age or medical condition  Airway Management Planned: Oral ETT  Additional Equipment:   Intra-op Plan:   Post-operative Plan: Extubation in OR  Informed Consent: I have reviewed the patients History and Physical, chart, labs and discussed the procedure including the risks, benefits and alternatives for the proposed anesthesia with the patient or authorized representative who has indicated his/her understanding and acceptance.     Dental advisory given  Plan Discussed with: CRNA and Surgeon  Anesthesia Plan Comments: (See PAT note 04/21/2023)       Anesthesia Quick Evaluation

## 2023-04-25 ENCOUNTER — Other Ambulatory Visit: Payer: Self-pay

## 2023-04-25 ENCOUNTER — Inpatient Hospital Stay (HOSPITAL_COMMUNITY): Payer: PPO | Admitting: Anesthesiology

## 2023-04-25 ENCOUNTER — Ambulatory Visit (HOSPITAL_COMMUNITY)
Admission: RE | Admit: 2023-04-25 | Discharge: 2023-04-25 | Disposition: A | Discharge status: Home / Self Care | Payer: PPO | Source: Ambulatory Visit | Attending: Surgery | Admitting: Surgery

## 2023-04-25 ENCOUNTER — Encounter (HOSPITAL_COMMUNITY)
Admission: RE | Disposition: A | Discharge status: Home / Self Care | Payer: Self-pay | Source: Ambulatory Visit | Attending: Surgery

## 2023-04-25 ENCOUNTER — Inpatient Hospital Stay (HOSPITAL_COMMUNITY): Payer: PPO | Admitting: Physician Assistant

## 2023-04-25 ENCOUNTER — Encounter (HOSPITAL_COMMUNITY): Payer: Self-pay | Admitting: Surgery

## 2023-04-25 DIAGNOSIS — I700 Atherosclerosis of aorta: Secondary | ICD-10-CM | POA: Diagnosis not present

## 2023-04-25 DIAGNOSIS — K6389 Other specified diseases of intestine: Secondary | ICD-10-CM | POA: Diagnosis not present

## 2023-04-25 DIAGNOSIS — Z7902 Long term (current) use of antithrombotics/antiplatelets: Secondary | ICD-10-CM | POA: Diagnosis not present

## 2023-04-25 DIAGNOSIS — I340 Nonrheumatic mitral (valve) insufficiency: Secondary | ICD-10-CM | POA: Diagnosis not present

## 2023-04-25 DIAGNOSIS — K660 Peritoneal adhesions (postprocedural) (postinfection): Secondary | ICD-10-CM

## 2023-04-25 DIAGNOSIS — E119 Type 2 diabetes mellitus without complications: Secondary | ICD-10-CM | POA: Insufficient documentation

## 2023-04-25 DIAGNOSIS — Z87891 Personal history of nicotine dependence: Secondary | ICD-10-CM

## 2023-04-25 DIAGNOSIS — K388 Other specified diseases of appendix: Secondary | ICD-10-CM | POA: Diagnosis not present

## 2023-04-25 DIAGNOSIS — I2510 Atherosclerotic heart disease of native coronary artery without angina pectoris: Secondary | ICD-10-CM | POA: Diagnosis not present

## 2023-04-25 DIAGNOSIS — I252 Old myocardial infarction: Secondary | ICD-10-CM

## 2023-04-25 DIAGNOSIS — Z951 Presence of aortocoronary bypass graft: Secondary | ICD-10-CM

## 2023-04-25 DIAGNOSIS — D121 Benign neoplasm of appendix: Secondary | ICD-10-CM | POA: Diagnosis not present

## 2023-04-25 DIAGNOSIS — I10 Essential (primary) hypertension: Secondary | ICD-10-CM | POA: Insufficient documentation

## 2023-04-25 DIAGNOSIS — E785 Hyperlipidemia, unspecified: Secondary | ICD-10-CM | POA: Insufficient documentation

## 2023-04-25 DIAGNOSIS — E1059 Type 1 diabetes mellitus with other circulatory complications: Secondary | ICD-10-CM

## 2023-04-25 HISTORY — PX: LAPAROSCOPIC APPENDECTOMY: SHX408

## 2023-04-25 LAB — GLUCOSE, CAPILLARY: Glucose-Capillary: 158 mg/dL — ABNORMAL HIGH (ref 70–99)

## 2023-04-25 LAB — TYPE AND SCREEN: ABO/RH(D): O POS

## 2023-04-25 SURGERY — APPENDECTOMY, LAPAROSCOPIC
Anesthesia: General

## 2023-04-25 MED ORDER — DEXAMETHASONE SODIUM PHOSPHATE 10 MG/ML IJ SOLN
INTRAMUSCULAR | Status: AC
  Filled 2023-04-25: qty 1

## 2023-04-25 MED ORDER — SUCCINYLCHOLINE CHLORIDE 200 MG/10ML IV SOSY
PREFILLED_SYRINGE | INTRAVENOUS | Status: DC | PRN
  Administered 2023-04-25: 140 mg via INTRAVENOUS

## 2023-04-25 MED ORDER — BUPIVACAINE LIPOSOME 1.3 % IJ SUSP
INTRAMUSCULAR | Status: DC | PRN
  Administered 2023-04-25: 50 mL

## 2023-04-25 MED ORDER — OXYCODONE HCL 5 MG PO TABS: 5.0000 mg | ORAL_TABLET | Freq: Once | ORAL | Status: AC | PRN

## 2023-04-25 MED ORDER — ACETAMINOPHEN 500 MG PO TABS
1000.0000 mg | ORAL_TABLET | ORAL | Status: AC
  Administered 2023-04-25: 1000 mg via ORAL
  Filled 2023-04-25: qty 2

## 2023-04-25 MED ORDER — ROCURONIUM BROMIDE 100 MG/10ML IV SOLN
INTRAVENOUS | Status: DC | PRN
  Administered 2023-04-25: 30 mg via INTRAVENOUS
  Administered 2023-04-25: 20 mg via INTRAVENOUS
  Administered 2023-04-25: 10 mg via INTRAVENOUS

## 2023-04-25 MED ORDER — OXYCODONE HCL 5 MG PO TABS
ORAL_TABLET | ORAL | Status: AC
  Administered 2023-04-25: 5 mg via ORAL
  Filled 2023-04-25: qty 1

## 2023-04-25 MED ORDER — BUPIVACAINE LIPOSOME 1.3 % IJ SUSP
INTRAMUSCULAR | Status: AC
  Filled 2023-04-25: qty 20

## 2023-04-25 MED ORDER — MIDAZOLAM HCL 5 MG/5ML IJ SOLN
INTRAMUSCULAR | Status: DC | PRN
  Administered 2023-04-25 (×2): 1 mg via INTRAVENOUS

## 2023-04-25 MED ORDER — ONDANSETRON HCL 4 MG/2ML IJ SOLN
INTRAMUSCULAR | Status: AC
  Filled 2023-04-25: qty 2

## 2023-04-25 MED ORDER — PROPOFOL 10 MG/ML IV BOLUS
INTRAVENOUS | Status: DC | PRN
  Administered 2023-04-25: 150 mg via INTRAVENOUS

## 2023-04-25 MED ORDER — SODIUM CHLORIDE 0.9 % IV SOLN
2.0000 g | INTRAVENOUS | Status: AC
  Administered 2023-04-25: 2 g via INTRAVENOUS
  Filled 2023-04-25: qty 2

## 2023-04-25 MED ORDER — ORAL CARE MOUTH RINSE: 15.0000 mL | Freq: Once | OROMUCOSAL | Status: AC

## 2023-04-25 MED ORDER — HYDROMORPHONE HCL 1 MG/ML IJ SOLN
INTRAMUSCULAR | Status: AC
  Filled 2023-04-25: qty 1

## 2023-04-25 MED ORDER — LIDOCAINE HCL (PF) 2 % IJ SOLN
INTRAMUSCULAR | Status: DC | PRN
  Administered 2023-04-25: 1.5 mg/kg/h via INTRADERMAL

## 2023-04-25 MED ORDER — FENTANYL CITRATE (PF) 100 MCG/2ML IJ SOLN
INTRAMUSCULAR | Status: DC | PRN
  Administered 2023-04-25: 50 ug via INTRAVENOUS
  Administered 2023-04-25: 100 ug via INTRAVENOUS

## 2023-04-25 MED ORDER — 0.9 % SODIUM CHLORIDE (POUR BTL) OPTIME
TOPICAL | Status: DC | PRN
  Administered 2023-04-25: 1000 mL

## 2023-04-25 MED ORDER — CHLORHEXIDINE GLUCONATE CLOTH 2 % EX PADS: 6.0000 | MEDICATED_PAD | Freq: Once | CUTANEOUS | Status: DC

## 2023-04-25 MED ORDER — ENSURE PRE-SURGERY PO LIQD
592.0000 mL | Freq: Once | ORAL | Status: DC
Start: 2023-04-25 — End: 2023-04-25

## 2023-04-25 MED ORDER — BUPIVACAINE HCL (PF) 0.25 % IJ SOLN
INTRAMUSCULAR | Status: AC
  Filled 2023-04-25: qty 30

## 2023-04-25 MED ORDER — ENSURE PRE-SURGERY PO LIQD
296.0000 mL | Freq: Once | ORAL | Status: DC
Start: 2023-04-26 — End: 2023-04-25

## 2023-04-25 MED ORDER — TRAMADOL HCL 50 MG PO TABS: 50.0000 mg | ORAL_TABLET | Freq: Four times a day (QID) | ORAL | 0 refills | Status: AC | PRN

## 2023-04-25 MED ORDER — KETOROLAC TROMETHAMINE 30 MG/ML IJ SOLN
INTRAMUSCULAR | Status: AC
  Filled 2023-04-25: qty 1

## 2023-04-25 MED ORDER — DEXAMETHASONE SODIUM PHOSPHATE 10 MG/ML IJ SOLN
INTRAMUSCULAR | Status: DC | PRN
  Administered 2023-04-25: 4 mg via INTRAVENOUS

## 2023-04-25 MED ORDER — LACTATED RINGERS IR SOLN
Status: DC | PRN
  Administered 2023-04-25: 1000 mL

## 2023-04-25 MED ORDER — OXYCODONE HCL 5 MG/5ML PO SOLN: 5.0000 mg | Freq: Once | ORAL | Status: AC | PRN

## 2023-04-25 MED ORDER — HYDROMORPHONE HCL 1 MG/ML IJ SOLN
0.2500 mg | INTRAMUSCULAR | Status: DC | PRN
  Administered 2023-04-25: 0.5 mg via INTRAVENOUS

## 2023-04-25 MED ORDER — ONDANSETRON HCL 4 MG/2ML IJ SOLN
INTRAMUSCULAR | Status: DC | PRN
  Administered 2023-04-25: 4 mg via INTRAVENOUS

## 2023-04-25 MED ORDER — FENTANYL CITRATE (PF) 250 MCG/5ML IJ SOLN
INTRAMUSCULAR | Status: AC
  Filled 2023-04-25: qty 5

## 2023-04-25 MED ORDER — LIDOCAINE HCL (PF) 2 % IJ SOLN
INTRAMUSCULAR | Status: AC
  Filled 2023-04-25: qty 5

## 2023-04-25 MED ORDER — SUGAMMADEX SODIUM 200 MG/2ML IV SOLN
INTRAVENOUS | Status: DC | PRN
  Administered 2023-04-25: 200 mg via INTRAVENOUS
  Administered 2023-04-25: 50 mg via INTRAVENOUS

## 2023-04-25 MED ORDER — MIDAZOLAM HCL 2 MG/2ML IJ SOLN
INTRAMUSCULAR | Status: AC
  Filled 2023-04-25: qty 2

## 2023-04-25 MED ORDER — ROCURONIUM BROMIDE 10 MG/ML (PF) SYRINGE
PREFILLED_SYRINGE | INTRAVENOUS | Status: AC
  Filled 2023-04-25: qty 10

## 2023-04-25 MED ORDER — KETOROLAC TROMETHAMINE 30 MG/ML IJ SOLN
30.0000 mg | Freq: Once | INTRAMUSCULAR | Status: AC | PRN
  Administered 2023-04-25: 30 mg via INTRAVENOUS

## 2023-04-25 MED ORDER — CHLORHEXIDINE GLUCONATE 0.12 % MT SOLN
15.0000 mL | Freq: Once | OROMUCOSAL | Status: AC
  Administered 2023-04-25: 15 mL via OROMUCOSAL

## 2023-04-25 MED ORDER — EPHEDRINE SULFATE (PRESSORS) 50 MG/ML IJ SOLN
INTRAMUSCULAR | Status: DC | PRN
  Administered 2023-04-25 (×2): 5 mg via INTRAVENOUS

## 2023-04-25 MED ORDER — PHENYLEPHRINE HCL-NACL 20-0.9 MG/250ML-% IV SOLN
INTRAVENOUS | Status: AC
  Filled 2023-04-25: qty 250

## 2023-04-25 MED ORDER — PROPOFOL 10 MG/ML IV BOLUS
INTRAVENOUS | Status: AC
  Filled 2023-04-25: qty 20

## 2023-04-25 MED ORDER — HEPARIN SODIUM (PORCINE) 5000 UNIT/ML IJ SOLN
5000.0000 [IU] | Freq: Once | INTRAMUSCULAR | Status: AC
  Administered 2023-04-25: 5000 [IU] via SUBCUTANEOUS
  Filled 2023-04-25: qty 1

## 2023-04-25 MED ORDER — BUPIVACAINE LIPOSOME 1.3 % IJ SUSP: 20.0000 mL | Freq: Once | INTRAMUSCULAR | Status: DC

## 2023-04-25 MED ORDER — LIDOCAINE HCL (CARDIAC) PF 100 MG/5ML IV SOSY
PREFILLED_SYRINGE | INTRAVENOUS | Status: DC | PRN
  Administered 2023-04-25: 80 mg via INTRAVENOUS

## 2023-04-25 MED ORDER — ONDANSETRON HCL 4 MG/2ML IJ SOLN: 4.0000 mg | Freq: Once | INTRAMUSCULAR | Status: DC | PRN

## 2023-04-25 MED ORDER — LACTATED RINGERS IV SOLN: INTRAVENOUS | Status: DC

## 2023-04-25 SURGICAL SUPPLY — 53 items
ADH SKN CLS APL DERMABOND .7 (GAUZE/BANDAGES/DRESSINGS) ×1
APL PRP STRL LF DISP 70% ISPRP (MISCELLANEOUS) ×1
APPLIER CLIP 5 13 M/L LIGAMAX5 (MISCELLANEOUS)
APPLIER CLIP ROT 10 11.4 M/L (STAPLE)
APR CLP MED LRG 11.4X10 (STAPLE)
APR CLP MED LRG 5 ANG JAW (MISCELLANEOUS)
BAG COUNTER SPONGE SURGICOUNT (BAG) ×2 IMPLANT
BAG SPNG CNTER NS LX DISP (BAG) ×1
CABLE HIGH FREQUENCY MONO STRZ (ELECTRODE) IMPLANT
CHLORAPREP W/TINT 26 (MISCELLANEOUS) ×2 IMPLANT
CLIP APPLIE 5 13 M/L LIGAMAX5 (MISCELLANEOUS) IMPLANT
CLIP APPLIE ROT 10 11.4 M/L (STAPLE) IMPLANT
COVER SURGICAL LIGHT HANDLE (MISCELLANEOUS) ×2 IMPLANT
CUTTER FLEX LINEAR 45M (STAPLE) ×2 IMPLANT
DERMABOND ADVANCED .7 DNX12 (GAUZE/BANDAGES/DRESSINGS) ×2 IMPLANT
DRAIN CHANNEL 19F RND (DRAIN) IMPLANT
ELECT PENCIL ROCKER SW 15FT (MISCELLANEOUS) ×2 IMPLANT
ELECT REM PT RETURN 15FT ADLT (MISCELLANEOUS) ×2 IMPLANT
ENDOLOOP SUT PDS II 0 18 (SUTURE) IMPLANT
EVACUATOR SILICONE 100CC (DRAIN) IMPLANT
GLOVE BIO SURGEON STRL SZ7.5 (GLOVE) ×2 IMPLANT
GLOVE INDICATOR 8.0 STRL GRN (GLOVE) ×2 IMPLANT
GOWN STRL REUS W/ TWL XL LVL3 (GOWN DISPOSABLE) ×4 IMPLANT
GOWN STRL REUS W/TWL XL LVL3 (GOWN DISPOSABLE) ×2
HANDLE STAPLE EGIA 4 XL (STAPLE) IMPLANT
IRRIG SUCT STRYKERFLOW 2 WTIP (MISCELLANEOUS) ×1
IRRIGATION SUCT STRKRFLW 2 WTP (MISCELLANEOUS) ×2 IMPLANT
KIT BASIN OR (CUSTOM PROCEDURE TRAY) ×2 IMPLANT
KIT TURNOVER KIT A (KITS) IMPLANT
PAD POSITIONING PINK XL (MISCELLANEOUS) ×2 IMPLANT
RELOAD 45 VASCULAR/THIN (ENDOMECHANICALS) IMPLANT
RELOAD EGIA 60 MED/THCK PURPLE (STAPLE) ×2 IMPLANT
RELOAD STAPLE 45 2.5 WHT GRN (ENDOMECHANICALS) IMPLANT
RELOAD STAPLE 45 3.5 BLU ETS (ENDOMECHANICALS) IMPLANT
RELOAD STAPLE 60 MED/THCK ART (STAPLE) IMPLANT
RELOAD STAPLE TA45 3.5 REG BLU (ENDOMECHANICALS) IMPLANT
SCISSORS LAP 5X35 DISP (ENDOMECHANICALS) IMPLANT
SEALER TISSUE G2 STRG ARTC 35C (ENDOMECHANICALS) IMPLANT
SET TUBE SMOKE EVAC HIGH FLOW (TUBING) ×2 IMPLANT
SHEARS HARMONIC ACE PLUS 36CM (ENDOMECHANICALS) ×2 IMPLANT
SLEEVE ADV FIXATION 5X100MM (TROCAR) ×2 IMPLANT
SPIKE FLUID TRANSFER (MISCELLANEOUS) ×2 IMPLANT
SUT ETHILON 3 0 PS 1 (SUTURE) IMPLANT
SUT MNCRL AB 4-0 PS2 18 (SUTURE) ×2 IMPLANT
SYS BAG RETRIEVAL 10MM (BASKET) ×1
SYSTEM BAG RETRIEVAL 10MM (BASKET) ×2 IMPLANT
TOWEL OR 17X26 10 PK STRL BLUE (TOWEL DISPOSABLE) IMPLANT
TOWEL OR NON WOVEN STRL DISP B (DISPOSABLE) ×2 IMPLANT
TRAY FOLEY MTR SLVR 14FR STAT (SET/KITS/TRAYS/PACK) ×2 IMPLANT
TRAY FOLEY MTR SLVR 16FR STAT (SET/KITS/TRAYS/PACK) ×2 IMPLANT
TRAY LAPAROSCOPIC (CUSTOM PROCEDURE TRAY) ×2 IMPLANT
TROCAR ADV FIXATION 5X100MM (TROCAR) ×2 IMPLANT
TROCAR BALLN 12MMX100 BLUNT (TROCAR) ×2 IMPLANT

## 2023-04-25 NOTE — Discharge Instructions (Addendum)
POST OP INSTRUCTIONS AFTER COLON SURGERY  DIET: Be sure to include lots of fluids daily to stay hydrated - 64oz of water per day (8, 8 oz glasses).  Avoid fast food or heavy meals for the first couple of weeks as your are more likely to get nauseated. Avoid raw/uncooked fruits or vegetables for the first 4 weeks (its ok to have these if they are blended into smoothie form). If you have fruits/vegetables, make sure they are cooked until soft enough to mash on the roof of your mouth and chew your food well. Otherwise, diet as tolerated.  Take your usually prescribed home medications unless otherwise directed.  PAIN CONTROL: Pain is best controlled by a usual combination of three different methods TOGETHER: Ice/Heat Over the counter pain medication Prescription pain medication Most patients will experience some swelling and bruising around the surgical site.  Ice packs or heating pads (30-60 minutes up to 6 times a day) will help. Some people prefer to use ice alone, heat alone, alternating between ice & heat.  Experiment to what works for you.  Swelling and bruising can take several weeks to resolve.   It is helpful to take an over-the-counter pain medication regularly for the first few weeks: Ibuprofen (Motrin/Advil) - 200mg  tabs - take 3 tabs (600mg ) every 6 hours as needed for pain (unless you have been directed previously to avoid NSAIDs/ibuprofen) Acetaminophen (Tylenol) - you may take 650mg  every 6 hours as needed. You can take this with motrin as they act differently on the body. If you are taking a narcotic pain medication that has acetaminophen in it, do not take over the counter tylenol at the same time. NOTE: You may take both of these medications together - most patients  find it most helpful when alternating between the two (i.e. Ibuprofen at 6am, tylenol at 9am, ibuprofen at 12pm ..Marland Kitchen) A  prescription for pain medication should be given to you upon discharge.  Take your pain medication as  prescribed if your pain is not adequatly controlled with the over-the-counter pain reliefs mentioned above. You may resume 81 mg Aspirin tomorrow. If otherwise doing well, may also resume Plavix 04/27/23.  Avoid getting constipated.  Between the surgery and the pain medications, it is common to experience some constipation.  Increasing fluid intake and taking a fiber supplement (such as Metamucil, Citrucel, FiberCon, MiraLax, etc) 1-2 times a day regularly will usually help prevent this problem from occurring.  A mild laxative (prune juice, Milk of Magnesia, MiraLax, etc) should be taken according to package directions if there are no bowel movements after 48 hours.    Dressing: Your incisions are covered in Dermabond which is like sterile superglue for the skin. This will come off on it's own in a couple weeks. It is waterproof and you may bathe normally starting the day after your surgery in a shower. Avoid baths/pools/lakes/oceans until your wounds have fully healed.  ACTIVITIES as tolerated:   Avoid heavy lifting (>10lbs or 1 gallon of milk) for the next 6 weeks. You may resume regular daily activities as tolerated--such as daily self-care, walking, climbing stairs--gradually increasing activities as tolerated.  If you can walk 30 minutes without difficulty, it is safe to try more intense activity such as jogging, treadmill, bicycling, low-impact aerobics.  DO NOT PUSH THROUGH PAIN.  Let pain be your guide: If it hurts to do something, don't do it. You may drive when you are no longer taking prescription pain medication, you can comfortably wear a  seatbelt, and you can safely maneuver your car and apply brakes.  FOLLOW UP in our office Please call [facility] at [phone] to set up an appointment to see your surgeon in the office for a follow-up appointment approximately 2 weeks after your surgery. Make sure that you call for this appointment the day you arrive home to insure a convenient appointment  time.  9. If you have disability or family leave forms that need to be completed, you may have them completed by your primary care physician's office; for return to work instructions, please ask our office staff and they will be happy to assist you in obtaining this documentation   When to call us [phone]: Poor pain control Reactions / problems with new medications (rash/itching, etc)  Fever over 101.5 F (38.5 C) Inability to urinate Nausea/vomiting Worsening swelling or bruising Continued bleeding from incision. Increased pain, redness, or drainage from the incision  The clinic staff is available to answer your questions during regular business hours (8:30am-5pm).  Please don't hesitate to call and ask to speak to one of our nurses for clinical concerns.   A surgeon from [facility] is always on call at the hospitals   If you have a medical emergency, go to the nearest emergency room or call 911.  [facility] [address], [address], [place], [place]  [address] MAIN: [phone] FAX: [phone] [email or web address]

## 2023-04-25 NOTE — Op Note (Signed)
Stephen Shelton [id number]   PRE-OPERATIVE DIAGNOSIS:  Appendiceal orifice adenomatous polyp  POST-OPERATIVE DIAGNOSIS:  Same  PROCEDURE:  Laparoscopic appendectomy Laparoscopic lysis of adhesions x 55 minutes  SURGEON:  Marin Olp, MD FACS  ASSISTANT: Luretha Murphy, MD FACS  ANESTHESIA: General endotracheal  EBL:   15 mL  DRAINS: None  SPECIMEN:  Appendix with cuff of cecum  COUNTS:  Sponge, needle and instrument counts were reported correct x2 at conclusion of the operation  DISPOSITION:  PACU in satisfactory condition  FINDINGS: Adhesions in the right lower quadrant from prior perforated appendicitis/surgery including omentum, cecum, distal and terminal ileum.  All lysed laparoscopically.  The appendix was ultimately visualized and somewhat diminutive as would be expected based on CT scan.  We were able to dissect this free of the surrounding distal ileum with a nice cuff of cecum between this and the ileocecal valve.  Therefore, an appendectomy including a cuff of cecum was undertaken.  Specimen opened on back table and there was no gross polypoid involvement of our staple line.  DESCRIPTION:   The patient was identified & brought into the operating room. SCDs were in place and functioning. General endotracheal anesthesia was administered. Preoperative antibiotics were administered. The patient was positioned supine with left arm tucked. Hair on the abdomen was then clipped by the OR team. A foley catheter was inserted under sterile conditions. The abdomen was prepped and draped in the standard sterile fashion. A surgical timeout confirmed our plan.  A small incision was made in the supraumbilical skin. The subcutaneous tissue was dissected and the umbilical stalk identified. The stalk was grasped with a Kocher and retracted outwardly. The supraumbilical fascia was exposed and incised. Peritoneal entry was carefully made bluntly. A 0 Vicryl purse-string suture was placed  and then the [facility] port was introduced into the abdomen.  CO2 insufflation commenced to . The laparoscope was inserted and confirmed no evidence of trocar site complications. The patient was then positioned in Trendelenburg. Two additional ports were placed - one in left lower quadrant and another in the suprapubic midline taking care to stay well above the bladder - 3 fingerbreadths above the pubic symphysis. The bed was then slightly tilted to place the left side down.  Adhesions identified in the right lower quadrant.  We began with adhesiolysis.  This is done sharply using laparoscopic scissors.  We began by freeing omentum from the peritoneal attachments where his prior attempted appendectomy was.  Following this, there were adhesions of the small bowel to the right lower quadrant which were also freed sharply.  The cecum and proximalmost ascending colon is adherent in this vicinity as well and able to be freed sharply.  All these adhesions are avascular.  The serosal surfaces of the ileum and cecum as well as ascending colon were all inspected.  There are no evident serosal defects.  Following this, were able to identify the appendix which is relatively diminutive approximately 2 inches in length.  This is consistent with his appearance on his CTA dated 11/2021.  An additional 5 mm port was placed in the right lower quadrant to facilitate an exposure and retraction.  There is no full-thickness extension of any sort of mass.  The base is healthy in appearance.  Were able to further mobilize the appendix from its attachments.  There is minimal with any appendiceal mesentery.  We are able to note that there is a few inches between the ileocecal valve and the base of his appendix.  Therefore, we felt comfortable including a cuff of cecum with the resection in order to obtain a distal margin that is negative without involving his ileocecal valve.  Laparoscopic Covidien stapler with a purple load was used  to divide the cuff of cecum including his appendix.  2 loads were used.  The ileocecal valve was inspected and found to be widely patent and not involved in our staple line.  The appendix was placed into an Endo Catch bag and removed through the [place] port site.  The specimen is then opened on the back table.  The staple line is partially freed and were able to evert the appendiceal base.  There is some nodular polypoid type mucosa visible and it does not appear to involve the staple line.  I scrubbed back in. The right lower quadrant was conservatively irrigated. Hemostasis was noted to be achieved - taking time to inspect the ligated mesoappendix. Staple line was noted to be intact on the cecum with well formed staples and no bleeding. The right lower quadrant appeared clean and as such, no drain was placed.  The left lower quadrant, right lower quadrant, and suprapubic ports were removed under direct visualization. The CO2 was exhausted from the abdomen. The umbilical fascia was then closed by tieing the 0 Vicryl suture, obliterating the fascial defect. The fascia was then palpated and noted to be completely closed. The skin of all port sites was approximated using 4-0 Monocryl suture. The abdomen is then washed and dried. The incisions are covered with Dermabond.  He was then awakened from anesthesia, extubated, and transferred to a stretcher for transport to PACU in satisfactory condition.

## 2023-04-25 NOTE — H&P (Signed)
CC: Here today for surgery  HPI: Stephen Shelton is an 67 y.o. male with history of HTN, HLD, CAD (Plavix), whom is seen in the office today as a referral by Dr. 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   He underwent colonoscopy 02/01/2023 with Dr. Lavon Paganini for screening purposes - Nodular mucosa in the cecum and at the appendiceal orifice. Biopsied. - Two 4 to 7 mm polyps in the sigmoid colon and in the transverse colon, removed with a cold snare. Resected and retrieved. - Likely benign polypoid lesion in the distal rectum. Biopsied. - Diverticulosis in the sigmoid colon, in the descending colon, in the transverse colon and in the ascending colon. - Non-bleeding external and internal hemorrhoids. - The GI Genius (intelligent endoscopy module), computer-aided polyp detection system powered by AI was utilized to detect colorectal polyps through enhanced visualization during colonoscopy.  PATH 1. Surgical [P], colon, cecum - TUBULAR ADENOMA(S). - NO HIGH GRADE DYSPLASIA OR MALIGNANCY. 2. Surgical [P], colon, transverse, polyp (1) - TUBULAR ADENOMA. - NO HIGH GRADE DYSPLASIA OR MALIGNANCY. 3. Surgical [P], colon, sigmoid, polyp (1) - HYPERPLASTIC POLYP. - NO DYSPLASIA OR MALIGNANCY. 4. Surgical [P], colon, anal canal biopsies - LOW-GRADE SQUAMOUS INTRAEPITHELIAL LESION (AIN 1)  He was subsequently referred to see [place] for consideration of appendectomy with cuff of cecum and evaluation of his LGSIL.  Reviewed photos of his colonoscopy and this mucosa does appear to be confined to his appendiceal orifice at least by picture #1. Picture #2 shows no clear pathology by my evaluation.  He does have a history however of attempted appendectomy back in the early 90s presumably at [facility] but unsure. He reports a perforated appendix with a large abscess the size of a softball. He reports they were told that no appendix could be identified and a drain was ultimately  placed.  He denies any changes in his health or health hx since we met in the office. States he is ready for surgery. Tolerated bowel prep with satisfactory result  PMH: HTN, HLD, CAD (Plavix; follows with Dr. Odis Hollingshead), genital warts  PSH: Cardiac bypass x 4-11/25/2021; attempted appendectomy 1991 for ruptured appendicitis. Reports he had an abscess the size of a softball and was in the hospital for about 2 weeks. He reports that no one could find his appendix.  FHx: Denies any known family history of colorectal, breast, endometrial or ovarian cancer  Social Hx: Denies use of tobacco/EtOH/illicit drug. He is here today alone.   Past Medical History:  Diagnosis Date   Coronary artery disease    Diabetes mellitus (HCC)    diet controlled   Hyperlipidemia    Hypertension    Myocardial infarction [facility])    11-25-2021   CABG   Pneumonia    Septic arthritis (HCC)    R shoulder, history of   Smoker     Past Surgical History:  Procedure Laterality Date   APPENDECTOMY     CORONARY ARTERY BYPASS GRAFT Right 11/25/2021   Procedure: CORONARY ARTERY BYPASS GRAFTING (CABG) X 4  ON PUMP USING LEFT INTERNAL MAMMARY ARTERY AND RIGHT ENDOSCOPIC GREATER SAPHENOUS VEIN CONDUITS;  Surgeon: Loreli Slot, MD;  Location: [facility] OR;  Service: Open Heart Surgery;  Laterality: Right;  Right radial artery harvest   ENDOVEIN HARVEST OF GREATER SAPHENOUS VEIN Right 11/25/2021   Procedure: ENDOVEIN HARVEST OF GREATER SAPHENOUS VEIN;  Surgeon: Loreli Slot, MD;  Location: [facility] OR;  Service: Open Heart Surgery;  Laterality: Right;  LEFT HEART CATH AND CORONARY ANGIOGRAPHY N/A 11/24/2021   Procedure: LEFT HEART CATH AND CORONARY ANGIOGRAPHY;  Surgeon: Elder Negus, MD;  Location: [facility] INVASIVE CV LAB;  Service: Cardiovascular;  Laterality: N/A;   septic arthritis shoulder Right     Family History  Problem Relation Age of Onset   Cancer Mother        multiple myeloma   Colon polyps Mother     Diabetes Father    Autoimmune disease Father        liver and renal disease   Colon polyps Father    Heart attack Brother    Colon polyps Brother    Diabetes Maternal Grandfather    Colon cancer Neg Hx    Prostate cancer Neg Hx    Rectal cancer Neg Hx    Stomach cancer Neg Hx     Social:  reports that he quit smoking about 16 months ago. His smoking use included cigarettes. He has a 16.50 pack-year smoking history. He has never used smokeless tobacco. He reports that he does not currently use alcohol. He reports that he does not use drugs.  Allergies:  Allergies  Allergen Reactions   Metformin And Related Other (See Comments)    abd pain with med use   Sulfa Antibiotics Other (See Comments)    Lightheaded, intolerant, "felt sick"    Medications: I have reviewed the patient's current medications.  Results for orders placed or performed during the hospital encounter of 04/25/23 (from the past 48 hour(s))  Glucose, capillary     Status: Abnormal   Collection Time: 04/25/23  6:22 AM  Result Value Ref Range   Glucose-Capillary 158 (H) 70 - 99 mg/dL    Comment: Glucose reference range applies only to samples taken after fasting for at least 8 hours.   Comment 1 Notify RN    Comment 2 Document in Chart     No results found.  ROS - all of the below systems have been reviewed with the patient and positives are indicated with bold text General: chills, fever or night sweats Eyes: blurry vision or double vision ENT: epistaxis or sore throat Allergy/Immunology: itchy/watery eyes or nasal congestion Hematologic/Lymphatic: bleeding problems, blood clots or swollen lymph nodes Endocrine: temperature intolerance or unexpected weight changes Breast: new or changing breast lumps or nipple discharge Resp: cough, shortness of breath, or wheezing CV: chest pain or dyspnea on exertion GI: as per HPI GU: dysuria, trouble voiding, or hematuria MSK: joint pain or joint stiffness Neuro: TIA  or stroke symptoms Derm: pruritus and skin lesion changes Psych: anxiety and depression  PE Blood pressure 126/65, pulse 72, temperature 97.6 F (36.4 C), temperature source Oral, resp. rate 16, height 5\' 11"  (1.803 m), weight 98 kg, SpO2 97 %. Constitutional: NAD; conversant Eyes: Moist conjunctiva Lungs: Normal respiratory effort CV: RRR; no palpable thrills; no pitting edema GI: Abd soft, NT/ND; no palpable hepatosplenomegaly MSK: Normal range of motion of extremities Psychiatric: Appropriate affect  Results for orders placed or performed during the hospital encounter of 04/25/23 (from the past 48 hour(s))  Glucose, capillary     Status: Abnormal   Collection Time: 04/25/23  6:22 AM  Result Value Ref Range   Glucose-Capillary 158 (H) 70 - 99 mg/dL    Comment: Glucose reference range applies only to samples taken after fasting for at least 8 hours.   Comment 1 Notify RN    Comment 2 Document in Chart     No results found.  A/P: Stephen Shelton is an 67 y.o. 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  -Cardiac clearance from Dr. Odis Hollingshead with plans to hold Plavix perioperatively  -The anatomy and physiology of the GI tract was reviewed with the patient. The pathophysiology of both adenomatous polyps as well as LGSIL was discussed as well with associated pictures. -We have discussed various different treatment options going forward including surgery (the most definitive) to address this -laparoscopic appendectomy (with cuff of cecum if feasible) -We also discussed scenarios where an ileocecectomy could be necessary and general risks therein -The planned procedure, material risks (including, but not limited to, pain, bleeding, infection, scarring, need for blood transfusion, damage to surrounding structures- blood vessels/nerves/viscus/organs, damage to ureter, urine  leak, leak from anastomosis, need for additional procedures, scenarios where a stoma may be necessary and where it may be permanent, worsening of pre-existing medical conditions, hernia, recurrence, pneumonia, heart attack, stroke, death) benefits and alternatives to surgery were discussed at length. The patient's questions were answered to his satisfaction, he voiced understanding and elected to proceed with surgery. Additionally, we discussed typical postoperative expectations and the recovery process.  -With regards to his LGSIL of the anal canal, we discussed ideally this procedure being performed in the prone jackknife position for best exposure. We also discussed that with the features seen on his biopsy, this being a very early lesion and there is no visible masses on exam today. We discussed the potential for anorectal exam under anesthesia and transanal excision of anal canal lesion in the weeks to months that follow after his surgery for his colon polyp.  -He does have a history of genital warts in the past. 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 He is aware of this. He understands that this is a sexually transmitted infections and importance of safe sex practices.   Marin Olp, MD [facility]

## 2023-04-25 NOTE — Anesthesia Postprocedure Evaluation (Signed)
Anesthesia Post Note  Patient: Stephen Shelton  Procedure(s) Performed: LAPAROSCOPIC APPENDECTOMY WITH LYSIS OF ADHESIONS     Patient location during evaluation: PACU Anesthesia Type: General Level of consciousness: awake and alert Pain management: pain level controlled Vital Signs Assessment: post-procedure vital signs reviewed and stable Respiratory status: spontaneous breathing, nonlabored ventilation, respiratory function stable and patient connected to nasal cannula oxygen Cardiovascular status: blood pressure returned to baseline and stable Postop Assessment: no apparent nausea or vomiting Anesthetic complications: no  No notable events documented.  Last Vitals:  Vitals:   04/25/23 1015 04/25/23 1026  BP: 120/60 122/62  Pulse: 72 74  Resp: 20 18  Temp: 36.7 C 36.7 C  SpO2: 100% 100%    Last Pain:  Vitals:   04/25/23 1026  TempSrc:   PainSc: 3                  Larence Thone S

## 2023-04-25 NOTE — Transfer of Care (Signed)
Immediate Anesthesia Transfer of Care Note  Patient: Stephen Shelton  Procedure(s) Performed: LAPAROSCOPIC APPENDECTOMY WITH LYSIS OF ADHESIONS  Patient Location: PACU  Anesthesia Type:General  Level of Consciousness: awake, alert , oriented, and patient cooperative  Airway & Oxygen Therapy: Patient Spontanous Breathing and Patient connected to face mask oxygen  Post-op Assessment: Report given to RN and Post -op Vital signs reviewed and stable  Post vital signs: Reviewed and stable  Last Vitals:  Vitals Value Taken Time  BP 139/80 04/25/23 0930  Temp    Pulse 78 04/25/23 0930  Resp 11 04/25/23 0930  SpO2 100 % 04/25/23 0930  Vitals shown include unvalidated device data.  Last Pain:  Vitals:   04/25/23 7829  TempSrc:   PainSc: 0-No pain         Complications: No notable events documented.

## 2023-04-25 NOTE — Anesthesia Procedure Notes (Signed)
Procedure Name: Intubation Date/Time: 04/25/2023 7:44 AM  Performed by: Garth Bigness, CRNAPre-anesthesia Checklist: Patient identified, Emergency Drugs available, Suction available and Patient being monitored Patient Re-evaluated:Patient Re-evaluated prior to induction Oxygen Delivery Method: Circle system utilized Preoxygenation: Pre-oxygenation with 100% oxygen Induction Type: IV induction Ventilation: Mask ventilation without difficulty Laryngoscope Size: Mac and 4 Grade View: Grade II Tube type: Oral Tube size: 7.5 mm Number of attempts: 2 Airway Equipment and Method: Stylet Placement Confirmation: ETT inserted through vocal cords under direct vision, positive ETCO2 and breath sounds checked- equal and bilateral Secured at: 23 cm Tube secured with: Tape Dental Injury: Teeth and Oropharynx as per pre-operative assessment

## 2023-04-26 ENCOUNTER — Encounter (HOSPITAL_COMMUNITY): Payer: Self-pay | Admitting: Surgery

## 2023-04-26 LAB — SURGICAL PATHOLOGY

## 2023-05-09 ENCOUNTER — Other Ambulatory Visit: Payer: Self-pay | Admitting: Cardiology

## 2023-05-13 ENCOUNTER — Other Ambulatory Visit: Payer: Self-pay | Admitting: Family Medicine

## 2023-05-13 ENCOUNTER — Other Ambulatory Visit (INDEPENDENT_AMBULATORY_CARE_PROVIDER_SITE_OTHER): Payer: PPO

## 2023-05-13 DIAGNOSIS — R809 Proteinuria, unspecified: Secondary | ICD-10-CM

## 2023-05-13 DIAGNOSIS — E785 Hyperlipidemia, unspecified: Secondary | ICD-10-CM

## 2023-05-13 DIAGNOSIS — E1129 Type 2 diabetes mellitus with other diabetic kidney complication: Secondary | ICD-10-CM | POA: Diagnosis not present

## 2023-05-13 DIAGNOSIS — Z125 Encounter for screening for malignant neoplasm of prostate: Secondary | ICD-10-CM | POA: Diagnosis not present

## 2023-05-13 DIAGNOSIS — E139 Other specified diabetes mellitus without complications: Secondary | ICD-10-CM

## 2023-05-13 DIAGNOSIS — E786 Lipoprotein deficiency: Secondary | ICD-10-CM

## 2023-05-13 LAB — COMPREHENSIVE METABOLIC PANEL
ALT: 20 U/L (ref 0–53)
AST: 20 U/L (ref 0–37)
Albumin: 4.1 g/dL (ref 3.5–5.2)
Alkaline Phosphatase: 71 U/L (ref 39–117)
BUN: 13 mg/dL (ref 6–23)
CO2: 30 mEq/L (ref 19–32)
Calcium: 9.6 mg/dL (ref 8.4–10.5)
Chloride: 103 mEq/L (ref 96–112)
Creatinine, Ser: 0.73 mg/dL (ref 0.40–1.50)
GFR: 94.45 mL/min (ref >60.00)
Glucose, Bld: 144 mg/dL — ABNORMAL HIGH (ref 70–99)
Potassium: 4.5 mEq/L (ref 3.5–5.1)
Sodium: 141 mEq/L (ref 135–145)
Total Bilirubin: 1 mg/dL (ref 0.2–1.2)
Total Protein: 6.5 g/dL (ref 6.0–8.3)

## 2023-05-13 LAB — PSA: PSA: 1.03 ng/mL (ref 0.10–4.00)

## 2023-05-13 LAB — LIPID PANEL
Cholesterol: 88 mg/dL (ref 0–200)
HDL: 36.5 mg/dL — ABNORMAL LOW (ref >39.00)
LDL Cholesterol: 32 mg/dL (ref 0–99)
NonHDL: 51.46
Total CHOL/HDL Ratio: 2
Triglycerides: 95 mg/dL (ref 0.0–149.0)
VLDL: 19 mg/dL (ref 0.0–40.0)

## 2023-05-13 LAB — MICROALBUMIN / CREATININE URINE RATIO
Creatinine,U: 129.3 mg/dL
Microalb Creat Ratio: 1.1 mg/g (ref 0.0–30.0)
Microalb, Ur: 1.5 mg/dL (ref 0.0–1.9)

## 2023-05-16 DIAGNOSIS — D126 Benign neoplasm of colon, unspecified: Secondary | ICD-10-CM | POA: Diagnosis not present

## 2023-05-16 DIAGNOSIS — K6282 Dysplasia of anus: Secondary | ICD-10-CM | POA: Diagnosis not present

## 2023-05-20 ENCOUNTER — Encounter: Payer: Self-pay | Admitting: Family Medicine

## 2023-05-20 ENCOUNTER — Ambulatory Visit (INDEPENDENT_AMBULATORY_CARE_PROVIDER_SITE_OTHER): Payer: PPO | Admitting: Family Medicine

## 2023-05-20 VITALS — BP 140/70 | HR 77 | Temp 98.7°F | Ht 71.0 in | Wt 215.0 lb

## 2023-05-20 DIAGNOSIS — Z0000 Encounter for general adult medical examination without abnormal findings: Secondary | ICD-10-CM | POA: Diagnosis not present

## 2023-05-20 DIAGNOSIS — E785 Hyperlipidemia, unspecified: Secondary | ICD-10-CM | POA: Diagnosis not present

## 2023-05-20 DIAGNOSIS — I10 Essential (primary) hypertension: Secondary | ICD-10-CM

## 2023-05-20 DIAGNOSIS — Z7189 Other specified counseling: Secondary | ICD-10-CM

## 2023-05-20 DIAGNOSIS — E1042 Type 1 diabetes mellitus with diabetic polyneuropathy: Secondary | ICD-10-CM

## 2023-05-20 NOTE — Patient Instructions (Addendum)
Please call endocrine when possible about follow up.   Take care.  Glad to see you. Update me as needed.

## 2023-05-20 NOTE — Progress Notes (Unsigned)
I have personally reviewed the Medicare Annual Wellness questionnaire and have noted 1. The patient's medical and social history 2. Their use of alcohol, tobacco or illicit drugs 3. Their current medications and supplements 4. The patient's functional ability including ADL's, fall risks, home safety risks and hearing or visual             impairment. 5. Diet and physical activities 6. Evidence for depression or mood disorders  The patients weight, height, BMI have been recorded in the chart and visual acuity is per eye clinic.  I have made referrals, counseling and provided education to the patient based review of the above and I have provided the pt with a written personalized care plan for preventive services.  Provider list updated- see scanned forms.  Routine anticipatory guidance given to patient.  See health maintenance. The possibility exists that previously documented standard health maintenance information may have been brought forward from a previous encounter into this note.  If needed, that same information has been updated to reflect the current situation based on today's encounter.    Flu Shingles PNA Tetanus Colon  Breast cancer screening Prostate cancer screening Advance directive Cognitive function addressed- see scanned forms- and if abnormal then additional documentation follows.   In addition to [facility], follow up visit for the below conditions:  D/w pt about surgery clinic eval and tx.  He has f/u pending.    CAD/Hypertension:    Using medication without problems or lightheadedness: yes  Chest pain with exertion:no Edema:no Short of breath:no  Elevated Cholesterol: Using medications without problems: yes Muscle aches: no Diet compliance: yes Exercise: yes  Diabetes:  No meds.  D/w pt about endo f/u.  Off creon in the meantime.  Still with some loose stools.  I need to update endocrine about patient's status and follow up.   PMH and SH  reviewed Meds, vitals, and allergies reviewed.   ROS: Per HPI.  Unless specifically indicated otherwise in HPI, the patient denies:  General: fever. Eyes: acute vision changes ENT: sore throat Cardiovascular: chest pain Respiratory: SOB GI: vomiting GU: dysuria Musculoskeletal: acute back pain Derm: acute rash Neuro: acute motor dysfunction Psych: worsening mood Endocrine: polydipsia Heme: bleeding Allergy: hayfever  GEN: nad, alert and oriented HEENT: mucous membranes moist NECK: supple w/o LA CV: rrr. PULM: ctab, no inc wob ABD: soft, +bs EXT: no edema SKIN: well perfused.  Abd wall port sites well healed.

## 2023-05-20 NOTE — Progress Notes (Signed)
Pt. Needs orders for surgery. 

## 2023-05-20 NOTE — Patient Instructions (Signed)
DUE TO COVID-19 ONLY TWO VISITORS  (aged 67 and older)  ARE ALLOWED TO COME WITH YOU AND STAY IN THE WAITING ROOM ONLY DURING PRE OP AND PROCEDURE.   **NO VISITORS ARE ALLOWED IN THE SHORT STAY AREA OR RECOVERY ROOM!!**  IF YOU WILL BE ADMITTED INTO THE HOSPITAL YOU ARE ALLOWED ONLY FOUR SUPPORT PEOPLE DURING VISITATION HOURS ONLY (7 AM -8PM)   The support person(s) must pass our screening, gel in and out, and wear a mask at all times, including in the patient's room. Patients must also wear a mask when staff or their support person are in the room. Visitors GUEST BADGE MUST BE WORN VISIBLY  One adult visitor may remain with you overnight and MUST be in the room by 8 P.M.     Your procedure is scheduled on: 05/25/23   Report to [facility] Main Entrance    Report to admitting at : 6:15 AM   Call this number if you have problems the morning of surgery [phone]   Do not eat food or drink :After Midnight.              FOLLOW BOWEL PREP AND ANY ADDITIONAL PRE OP INSTRUCTIONS YOU RECEIVED FROM YOUR SURGEON'S OFFICE!!!   Oral Hygiene is also important to reduce your risk of infection.                                    Remember - BRUSH YOUR TEETH THE MORNING OF SURGERY WITH YOUR REGULAR TOOTHPASTE  DENTURES WILL BE REMOVED PRIOR TO SURGERY PLEASE DO NOT APPLY "Poly grip" OR ADHESIVES!!!   Do NOT smoke after Midnight   Take these medicines the morning of surgery with A SIP OF WATER: metoprolol.  DO NOT TAKE ANY ORAL DIABETIC MEDICATIONS DAY OF YOUR SURGERY  Bring CPAP mask and tubing day of surgery.                              You may not have any metal on your body including hair pins, jewelry, and body piercing             Do not wear lotions, powders, perfumes/cologne, or deodorant              Men may shave face and neck.   Do not bring valuables to the hospital. [facility] IS NOT             RESPONSIBLE   FOR VALUABLES.   Contacts, glasses, or bridgework may not  be worn into surgery.   Bring small overnight bag day of surgery.   DO NOT BRING YOUR HOME MEDICATIONS TO THE HOSPITAL. PHARMACY WILL DISPENSE MEDICATIONS LISTED ON YOUR MEDICATION LIST TO YOU DURING YOUR ADMISSION IN THE HOSPITAL!    Patients discharged on the day of surgery will not be allowed to drive home.  Someone NEEDS to stay with you for the first 24 hours after anesthesia.   Special Instructions: Bring a copy of your healthcare power of attorney and living will documents         the day of surgery if you haven't scanned them before.              Please read over the following fact sheets you were given: IF YOU HAVE QUESTIONS ABOUT YOUR PRE-OP INSTRUCTIONS PLEASE CALL [phone]  [place] - Preparing for Surgery Before surgery, you can play an important role.  Because skin is not sterile, your skin needs to be as free of germs as possible.  You can reduce the number of germs on your skin by washing with CHG (chlorahexidine gluconate) soap before surgery.  CHG is an antiseptic cleaner which kills germs and bonds with the skin to continue killing germs even after washing. Please DO NOT use if you have an allergy to CHG or antibacterial soaps.  If your skin becomes reddened/irritated stop using the CHG and inform your nurse when you arrive at Short Stay. Do not shave (including legs and underarms) for at least 48 hours prior to the first CHG shower.  You may shave your face/neck. Please follow these instructions carefully:  1.  Shower with CHG Soap the night before surgery and the  morning of Surgery.  2.  If you choose to wash your hair, wash your hair first as usual with your  normal  shampoo.  3.  After you shampoo, rinse your hair and body thoroughly to remove the  shampoo.                           4.  Use CHG as you would any other liquid soap.  You can apply chg directly  to the skin and wash                       Gently with a scrungie or clean washcloth.  5.  Apply the CHG  Soap to your body ONLY FROM THE NECK DOWN.   Do not use on face/ open                           Wound or open sores. Avoid contact with eyes, ears mouth and genitals (private parts).                       Wash face,  Genitals (private parts) with your normal soap.             6.  Wash thoroughly, paying special attention to the area where your surgery  will be performed.  7.  Thoroughly rinse your body with warm water from the neck down.  8.  DO NOT shower/wash with your normal soap after using and rinsing off  the CHG Soap.                9.  Pat yourself dry with a clean towel.            10.  Wear clean pajamas.            11.  Place clean sheets on your bed the night of your first shower and do not  sleep with pets. Day of Surgery : Do not apply any lotions/deodorants the morning of surgery.  Please wear clean clothes to the hospital/surgery center.  FAILURE TO FOLLOW THESE INSTRUCTIONS MAY RESULT IN THE CANCELLATION OF YOUR SURGERY PATIENT SIGNATURE_________________________________  NURSE SIGNATURE__________________________________  ________________________________________________________________________

## 2023-05-22 DIAGNOSIS — Z0000 Encounter for general adult medical examination without abnormal findings: Secondary | ICD-10-CM | POA: Insufficient documentation

## 2023-05-22 NOTE — Assessment & Plan Note (Signed)
Continue work on diet and exercise.  Continue atorvastatin. 

## 2023-05-22 NOTE — Assessment & Plan Note (Signed)
Advance directive- wife designated if patient were incapacitated.  

## 2023-05-22 NOTE — Assessment & Plan Note (Signed)
Flu previously done Shingles previously done PNA previously done Tetanus 2020 COVID-vaccine previously done Colonoscopy 2024 Prostate cancer screening 2024 Advance directive-wife designated if patient were incapacitated. Cognitive function addressed- see scanned forms- and if abnormal then additional documentation follows.

## 2023-05-22 NOTE — Assessment & Plan Note (Signed)
I asked him to call endocrinology about follow-up.  I routed this note as FYI.  Treated with diet currently.  No medications currently.

## 2023-05-22 NOTE — Assessment & Plan Note (Signed)
Continue work on diet and exercise.  Continue metoprolol and lisinopril.

## 2023-05-22 NOTE — Assessment & Plan Note (Signed)
Flu previously done Shingles previously done PNA previously done Tetanus 2020 COVID-vaccine previously done Colonoscopy 2024 Prostate cancer screening 2024 Advance directive-wife designated if patient were incapacitated. Cognitive function addressed- see scanned forms- and if abnormal then additional documentation follows.  

## 2023-05-23 ENCOUNTER — Encounter (HOSPITAL_COMMUNITY): Payer: Self-pay

## 2023-05-23 ENCOUNTER — Encounter (HOSPITAL_COMMUNITY)
Admission: RE | Admit: 2023-05-23 | Discharge: 2023-05-23 | Disposition: A | Discharge status: Home / Self Care | Payer: PPO | Source: Ambulatory Visit | Attending: Surgery | Admitting: Surgery

## 2023-05-23 ENCOUNTER — Other Ambulatory Visit: Payer: Self-pay

## 2023-05-23 ENCOUNTER — Encounter: Payer: Self-pay | Admitting: Family Medicine

## 2023-05-23 VITALS — BP 156/82 | HR 71 | Temp 98.0°F | Ht 71.0 in | Wt 215.0 lb

## 2023-05-23 DIAGNOSIS — I10 Essential (primary) hypertension: Secondary | ICD-10-CM

## 2023-05-23 DIAGNOSIS — E1059 Type 1 diabetes mellitus with other circulatory complications: Secondary | ICD-10-CM

## 2023-05-23 DIAGNOSIS — I252 Old myocardial infarction: Secondary | ICD-10-CM | POA: Insufficient documentation

## 2023-05-23 DIAGNOSIS — K629 Disease of anus and rectum, unspecified: Secondary | ICD-10-CM | POA: Insufficient documentation

## 2023-05-23 DIAGNOSIS — Z9049 Acquired absence of other specified parts of digestive tract: Secondary | ICD-10-CM | POA: Insufficient documentation

## 2023-05-23 DIAGNOSIS — Z87891 Personal history of nicotine dependence: Secondary | ICD-10-CM | POA: Insufficient documentation

## 2023-05-23 DIAGNOSIS — Z951 Presence of aortocoronary bypass graft: Secondary | ICD-10-CM | POA: Diagnosis not present

## 2023-05-23 DIAGNOSIS — I2510 Atherosclerotic heart disease of native coronary artery without angina pectoris: Secondary | ICD-10-CM | POA: Insufficient documentation

## 2023-05-23 DIAGNOSIS — Z01812 Encounter for preprocedural laboratory examination: Secondary | ICD-10-CM | POA: Diagnosis not present

## 2023-05-23 DIAGNOSIS — E1042 Type 1 diabetes mellitus with diabetic polyneuropathy: Secondary | ICD-10-CM | POA: Diagnosis not present

## 2023-05-23 LAB — GLUCOSE, CAPILLARY: Glucose-Capillary: 165 mg/dL — ABNORMAL HIGH (ref 70–99)

## 2023-05-23 LAB — CBC
HCT: 44 % (ref 39.0–52.0)
Hemoglobin: 14.3 g/dL (ref 13.0–17.0)
MCH: 28 pg (ref 26.0–34.0)
MCHC: 32.5 g/dL (ref 30.0–36.0)
MCV: 86.3 fL (ref 80.0–100.0)
Platelets: 192 10*3/uL (ref 150–400)
RBC: 5.1 MIL/uL (ref 4.22–5.81)
RDW: 15.1 % (ref 11.5–15.5)
WBC: 8.9 10*3/uL (ref 4.0–10.5)
nRBC: 0 % (ref 0.0–0.2)

## 2023-05-23 NOTE — Progress Notes (Addendum)
For Short Stay: COVID SWAB appointment date:  Bowel Prep reminder:   For Anesthesia: PCP - Dr. Crawford Givens. Cardiologist - DO: Tolia Sunit. LOS: 08/2022.  Chest x-ray -  EKG - 09/10/22 Stress Test -  ECHO - 11/24/21 Cardiac Cath -  Pacemaker/ICD device last checked: Pacemaker orders received: Device Rep notified:  Spinal Cord Stimulator: N/A  Sleep Study - N/A CPAP -   Fasting Blood Sugar - N/A Checks Blood Sugar ___1__ times a day Date and result of last Hgb A1c- 8.2: 04/21/23  Last dose of GLP1 agonist- N/A GLP1 instructions:   Last dose of SGLT-2 inhibitors- N/A SGLT-2 instructions:   Blood Thinner Instructions: To continue Plavix Aspirin Instructions: To continue taking it. Last Dose:  Activity level: Can go up a flight of stairs and activities of daily living without stopping and without chest pain and/or shortness of breath   Able to exercise without chest pain and/or shortness of breath    Anesthesia review: Hx: HTN,MI,CAD,DIA type 1.  Patient denies shortness of breath, fever, cough and chest pain at PAT appointment   Patient verbalized understanding of instructions that were given to them at the PAT appointment. Patient was also instructed that they will need to review over the PAT instructions again at home before surgery.

## 2023-05-24 ENCOUNTER — Encounter (HOSPITAL_COMMUNITY): Payer: Self-pay

## 2023-05-24 NOTE — Anesthesia Preprocedure Evaluation (Addendum)
Anesthesia Evaluation  Patient identified by MRN, date of birth, ID band Patient awake    Reviewed: Allergy & Precautions, H&P , NPO status , Patient's Chart, lab work & pertinent test results  Airway Mallampati: II  TM Distance: >3 FB Neck ROM: Full    Dental no notable dental hx. (+) Teeth Intact, Dental Advisory Given   Pulmonary neg pulmonary ROS, former smoker   Pulmonary exam normal breath sounds clear to auscultation       Cardiovascular Exercise Tolerance: Good hypertension, Pt. on medications and Pt. on home beta blockers + CAD and + CABG   Rhythm:Regular Rate:Normal     Neuro/Psych negative neurological ROS  negative psych ROS   GI/Hepatic negative GI ROS, Neg liver ROS,,,  Endo/Other  diabetes, Well Controlled    Renal/GU negative Renal ROS  negative genitourinary   Musculoskeletal  (+) Arthritis , Osteoarthritis,    Abdominal   Peds  Hematology negative hematology ROS (+)   Anesthesia Other Findings   Reproductive/Obstetrics negative OB ROS                             Anesthesia Physical Anesthesia Plan  ASA: 3  Anesthesia Plan: General   Post-op Pain Management: Tylenol PO (pre-op)*   Induction: Intravenous  PONV Risk Score and Plan: 3 and Ondansetron, Dexamethasone and Treatment may vary due to age or medical condition  Airway Management Planned: Oral ETT  Additional Equipment:   Intra-op Plan:   Post-operative Plan: Extubation in OR  Informed Consent: I have reviewed the patients History and Physical, chart, labs and discussed the procedure including the risks, benefits and alternatives for the proposed anesthesia with the patient or authorized representative who has indicated his/her understanding and acceptance.     Dental advisory given  Plan Discussed with: CRNA  Anesthesia Plan Comments: (See PAT note from 7/1 by Sherlie Ban PA-C )         Anesthesia Quick Evaluation

## 2023-05-24 NOTE — Progress Notes (Signed)
Choose an anesthesia record to view details        DISCUSSION: Stephen Shelton is a 67 year old male who presents to PAT prior to anorectal exam under anesthesia with transanal excision of anorectal lesion with Dr. Marin Olp on 05/25/2023. Past medical history significant for former smoking (quit 11/2021 after his MI), history of NSTEMI, history of CAD status post CABG, (Jan 2023), history of diabetes, hypertension, recent appendectomy on 04/25/2023 with Dr. Cliffton Asters, anal lesion.  Patient underwent colonoscopy on 02/01/2023 due to positive Cologuard.  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  He underwent laparoscopic appendectomy and lysis of adhesions on 04/25/2023 which was uncomplicated. Now going back to the OR for EUA with resection of anal lesion.  Patient follows with cardiology for history of CABG.  Last seen on 09/10/2022 and was noted to be doing very well.  Received clearance for prior surgery on 03/13/2023 (viewable in letters tab):  "Stephen Shelton is at acceptable risk, from a cardiac standpoint, for his upcoming surgery laparoscopic versus open appendectomy and possible ileocolectomy.   If applicable can hold aspirin and Plavix for 5  day(s) prior to surgery and re-start once appropriate hemostasis is achieved, as per surgeon's recommendations."  Patient was advised he does not need to hold his Plavix or ASA by Dr. Lucilla Lame office.  VS: BP (!) 156/82   Pulse 71   Temp 36.7 C (Oral)   Ht 5\' 11"  (1.803 m)   Wt 97.5 kg   SpO2 97%   BMI 29.99 kg/m   PROVIDERS: Joaquim Nam, MD Cardiology: Tessa Lerner, DO  LABS: Labs reviewed: Acceptable for surgery. (all labs ordered are listed, but only abnormal results are displayed)  Labs Reviewed  GLUCOSE, CAPILLARY - Abnormal; Notable for the following components:      Result Value   Glucose-Capillary 165 (*)    All other components within normal limits  CBC     IMAGES: n/a   EKG: n/a   CV:  Echo 11/24/21: IMPRESSIONS      1. Left ventricular ejection fraction, by estimation, is 50 to 55%. The  left ventricle has low normal function. The left ventricle has no regional  wall motion abnormalities. There is mild left ventricular hypertrophy.  Left ventricular diastolic  parameters are consistent with Grade II diastolic dysfunction  (pseudonormalization). Elevated left atrial pressure.   2. Right ventricular systolic function is normal. The right ventricular  size is normal.   3. Left atrial size was mildly dilated.   4. The mitral valve is degenerative. Mild mitral valve regurgitation. No  evidence of mitral stenosis.   5. The aortic valve is tricuspid. Aortic valve regurgitation is not  visualized. Aortic valve sclerosis/calcification is present, without any  evidence of aortic stenosis.   Left heart cath 11/24/21:  LM: Normal LAD: Complex mid LAD/Diag2/septal 90% trifurcation lesion ([place] 1,1,1) Lcx: Prox OM2 80% stenosis RCA: Prox CTO. Epicardial right-to-right and left-to-right collaterals, mostly from LAD septals   LVEDP normal LVEF by LV gram 50-55%.    Continue Aspirin, heparin, statin, blood pressure control CVTS consulted for CABG   Past Medical History:  Diagnosis Date   Coronary artery disease    Diabetes mellitus (HCC)    diet controlled   Hyperlipidemia    Hypertension    Myocardial infarction [facility])    11-25-2021   CABG   Pneumonia    Septic arthritis (HCC)    R shoulder, history of   Smoker     Past Surgical History:  Procedure Laterality Date   APPENDECTOMY     COLON SURGERY     CORONARY ARTERY BYPASS GRAFT Right 11/25/2021   Procedure: CORONARY ARTERY BYPASS GRAFTING (CABG) X 4  ON PUMP USING LEFT INTERNAL MAMMARY ARTERY AND RIGHT ENDOSCOPIC GREATER SAPHENOUS VEIN CONDUITS;  Surgeon: Loreli Slot, MD;  Location: [facility] OR;  Service: Open Heart Surgery;  Laterality: Right;  Right radial artery harvest   ENDOVEIN HARVEST OF GREATER SAPHENOUS VEIN Right 11/25/2021    Procedure: ENDOVEIN HARVEST OF GREATER SAPHENOUS VEIN;  Surgeon: Loreli Slot, MD;  Location: [facility] OR;  Service: Open Heart Surgery;  Laterality: Right;   LAPAROSCOPIC APPENDECTOMY N/A 04/25/2023   Procedure: LAPAROSCOPIC APPENDECTOMY WITH LYSIS OF ADHESIONS;  Surgeon: Andria Meuse, MD;  Location: [facility] ORS;  Service: General;  Laterality: N/A;   LEFT HEART CATH AND CORONARY ANGIOGRAPHY N/A 11/24/2021   Procedure: LEFT HEART CATH AND CORONARY ANGIOGRAPHY;  Surgeon: Elder Negus, MD;  Location: [facility] INVASIVE CV LAB;  Service: Cardiovascular;  Laterality: N/A;   PILONIDAL CYST EXCISION  1974   septic arthritis shoulder Right     MEDICATIONS:  aspirin 81 MG EC tablet   atorvastatin (LIPITOR) 80 MG tablet   clopidogrel (PLAVIX) 75 MG tablet   lisinopril (ZESTRIL) 10 MG tablet   Metoprolol Tartrate 37.5 MG TABS   Multiple Vitamin (MULTIVITAMIN) tablet   nitroGLYCERIN (NITROSTAT) 0.4 MG SL tablet   No current facility-administered medications for this encounter.   Marcille Blanco [facility] Surgical Short Stay/Anesthesiology [facility] Phone [phone] 05/24/2023 8:52 AM

## 2023-05-25 ENCOUNTER — Other Ambulatory Visit: Payer: Self-pay

## 2023-05-25 ENCOUNTER — Ambulatory Visit (HOSPITAL_COMMUNITY)
Admission: RE | Admit: 2023-05-25 | Discharge: 2023-05-25 | Disposition: A | Discharge status: Home / Self Care | Payer: PPO | Attending: Surgery | Admitting: Surgery

## 2023-05-25 ENCOUNTER — Ambulatory Visit (HOSPITAL_BASED_OUTPATIENT_CLINIC_OR_DEPARTMENT_OTHER): Payer: PPO | Admitting: Anesthesiology

## 2023-05-25 ENCOUNTER — Encounter (HOSPITAL_COMMUNITY)
Admission: RE | Disposition: A | Discharge status: Home / Self Care | Payer: Self-pay | Source: Home / Self Care | Attending: Surgery

## 2023-05-25 ENCOUNTER — Ambulatory Visit (HOSPITAL_COMMUNITY): Payer: PPO | Admitting: Medical

## 2023-05-25 ENCOUNTER — Encounter (HOSPITAL_COMMUNITY): Payer: Self-pay | Admitting: Surgery

## 2023-05-25 DIAGNOSIS — K6282 Dysplasia of anus: Secondary | ICD-10-CM | POA: Insufficient documentation

## 2023-05-25 DIAGNOSIS — Z7902 Long term (current) use of antithrombotics/antiplatelets: Secondary | ICD-10-CM | POA: Insufficient documentation

## 2023-05-25 DIAGNOSIS — R85612 Low grade squamous intraepithelial lesion on cytologic smear of anus (LGSIL): Secondary | ICD-10-CM | POA: Diagnosis not present

## 2023-05-25 DIAGNOSIS — Z87891 Personal history of nicotine dependence: Secondary | ICD-10-CM | POA: Diagnosis not present

## 2023-05-25 DIAGNOSIS — Z79899 Other long term (current) drug therapy: Secondary | ICD-10-CM | POA: Insufficient documentation

## 2023-05-25 DIAGNOSIS — F17200 Nicotine dependence, unspecified, uncomplicated: Secondary | ICD-10-CM | POA: Diagnosis not present

## 2023-05-25 DIAGNOSIS — Z951 Presence of aortocoronary bypass graft: Secondary | ICD-10-CM | POA: Diagnosis not present

## 2023-05-25 DIAGNOSIS — I10 Essential (primary) hypertension: Secondary | ICD-10-CM | POA: Diagnosis not present

## 2023-05-25 DIAGNOSIS — K6289 Other specified diseases of anus and rectum: Secondary | ICD-10-CM | POA: Diagnosis not present

## 2023-05-25 DIAGNOSIS — I2510 Atherosclerotic heart disease of native coronary artery without angina pectoris: Secondary | ICD-10-CM

## 2023-05-25 DIAGNOSIS — E119 Type 2 diabetes mellitus without complications: Secondary | ICD-10-CM | POA: Insufficient documentation

## 2023-05-25 HISTORY — PX: RECTAL EXAM UNDER ANESTHESIA: SHX6399

## 2023-05-25 HISTORY — PX: TRANSANAL EXCISION OF RECTAL MASS: SHX6134

## 2023-05-25 LAB — GLUCOSE, CAPILLARY
Glucose-Capillary: 138 mg/dL — ABNORMAL HIGH (ref 70–99)
Glucose-Capillary: 116 mg/dL — ABNORMAL HIGH (ref 70–99)

## 2023-05-25 SURGERY — EXCISION, MASS, RECTUM, ANAL APPROACH
Anesthesia: General

## 2023-05-25 MED ORDER — DEXAMETHASONE SODIUM PHOSPHATE 10 MG/ML IJ SOLN
INTRAMUSCULAR | Status: DC | PRN
  Administered 2023-05-25: 10 mg via INTRAVENOUS

## 2023-05-25 MED ORDER — FENTANYL CITRATE PF 50 MCG/ML IJ SOSY: 25.0000 ug | PREFILLED_SYRINGE | INTRAMUSCULAR | Status: DC | PRN

## 2023-05-25 MED ORDER — MIDAZOLAM HCL 5 MG/5ML IJ SOLN
INTRAMUSCULAR | Status: DC | PRN
  Administered 2023-05-25: 2 mg via INTRAVENOUS

## 2023-05-25 MED ORDER — 0.9 % SODIUM CHLORIDE (POUR BTL) OPTIME
TOPICAL | Status: DC | PRN
  Administered 2023-05-25: 1000 mL

## 2023-05-25 MED ORDER — BUPIVACAINE LIPOSOME 1.3 % IJ SUSP: 20.0000 mL | Freq: Once | INTRAMUSCULAR | Status: DC

## 2023-05-25 MED ORDER — PHENYLEPHRINE 80 MCG/ML (10ML) SYRINGE FOR IV PUSH (FOR BLOOD PRESSURE SUPPORT)
PREFILLED_SYRINGE | INTRAVENOUS | Status: AC
  Filled 2023-05-25: qty 10

## 2023-05-25 MED ORDER — CEFAZOLIN SODIUM-DEXTROSE 2-4 GM/100ML-% IV SOLN: 2.0000 g | Freq: Once | INTRAVENOUS | Status: DC

## 2023-05-25 MED ORDER — SODIUM CHLORIDE (PF) 0.9 % IJ SOLN
INTRAMUSCULAR | Status: DC | PRN
  Administered 2023-05-25: 50 mL

## 2023-05-25 MED ORDER — SODIUM CHLORIDE (PF) 0.9 % IJ SOLN
INTRAMUSCULAR | Status: AC
  Filled 2023-05-25: qty 20

## 2023-05-25 MED ORDER — LACTATED RINGERS IV SOLN: INTRAVENOUS | Status: DC

## 2023-05-25 MED ORDER — CEFAZOLIN SODIUM 1 G IJ SOLR
INTRAMUSCULAR | Status: AC
  Filled 2023-05-25: qty 20

## 2023-05-25 MED ORDER — TRAMADOL HCL 50 MG PO TABS: 50.0000 mg | ORAL_TABLET | Freq: Four times a day (QID) | ORAL | 0 refills | Status: AC | PRN

## 2023-05-25 MED ORDER — SUGAMMADEX SODIUM 200 MG/2ML IV SOLN
INTRAVENOUS | Status: DC | PRN
  Administered 2023-05-25: 200 mg via INTRAVENOUS

## 2023-05-25 MED ORDER — LIDOCAINE HCL (PF) 2 % IJ SOLN
INTRAMUSCULAR | Status: AC
  Filled 2023-05-25: qty 5

## 2023-05-25 MED ORDER — ONDANSETRON HCL 4 MG/2ML IJ SOLN
INTRAMUSCULAR | Status: AC
  Filled 2023-05-25: qty 2

## 2023-05-25 MED ORDER — SODIUM CHLORIDE 0.9 % IV SOLN
INTRAVENOUS | Status: DC | PRN
  Administered 2023-05-25: 2 g via INTRAVENOUS

## 2023-05-25 MED ORDER — MIDAZOLAM HCL 2 MG/2ML IJ SOLN
INTRAMUSCULAR | Status: AC
  Filled 2023-05-25: qty 2

## 2023-05-25 MED ORDER — DEXAMETHASONE SODIUM PHOSPHATE 10 MG/ML IJ SOLN
INTRAMUSCULAR | Status: AC
  Filled 2023-05-25: qty 1

## 2023-05-25 MED ORDER — ORAL CARE MOUTH RINSE: 15.0000 mL | Freq: Once | OROMUCOSAL | Status: AC

## 2023-05-25 MED ORDER — PROPOFOL 10 MG/ML IV BOLUS
INTRAVENOUS | Status: DC | PRN
  Administered 2023-05-25: 150 mg via INTRAVENOUS

## 2023-05-25 MED ORDER — CEFAZOLIN SODIUM-DEXTROSE 2-4 GM/100ML-% IV SOLN: 2.0000 g | INTRAVENOUS | Status: DC

## 2023-05-25 MED ORDER — PROPOFOL 10 MG/ML IV BOLUS
INTRAVENOUS | Status: AC
  Filled 2023-05-25: qty 20

## 2023-05-25 MED ORDER — ACETAMINOPHEN 500 MG PO TABS: 1000.0000 mg | ORAL_TABLET | ORAL | Status: DC

## 2023-05-25 MED ORDER — CHLORHEXIDINE GLUCONATE 0.12 % MT SOLN
15.0000 mL | Freq: Once | OROMUCOSAL | Status: AC
  Administered 2023-05-25: 15 mL via OROMUCOSAL

## 2023-05-25 MED ORDER — BUPIVACAINE-EPINEPHRINE (PF) 0.5% -1:200000 IJ SOLN
INTRAMUSCULAR | Status: AC
  Filled 2023-05-25: qty 30

## 2023-05-25 MED ORDER — PHENYLEPHRINE 80 MCG/ML (10ML) SYRINGE FOR IV PUSH (FOR BLOOD PRESSURE SUPPORT)
PREFILLED_SYRINGE | INTRAVENOUS | Status: DC | PRN
  Administered 2023-05-25 (×2): 160 ug via INTRAVENOUS

## 2023-05-25 MED ORDER — FENTANYL CITRATE (PF) 100 MCG/2ML IJ SOLN
INTRAMUSCULAR | Status: AC
  Filled 2023-05-25: qty 2

## 2023-05-25 MED ORDER — ROCURONIUM BROMIDE 10 MG/ML (PF) SYRINGE
PREFILLED_SYRINGE | INTRAVENOUS | Status: DC | PRN
  Administered 2023-05-25: 60 mg via INTRAVENOUS

## 2023-05-25 MED ORDER — ONDANSETRON HCL 4 MG/2ML IJ SOLN
INTRAMUSCULAR | Status: DC | PRN
  Administered 2023-05-25: 4 mg via INTRAVENOUS

## 2023-05-25 MED ORDER — LIDOCAINE 2% (20 MG/ML) 5 ML SYRINGE
INTRAMUSCULAR | Status: DC | PRN
  Administered 2023-05-25: 100 mg via INTRAVENOUS

## 2023-05-25 MED ORDER — BUPIVACAINE LIPOSOME 1.3 % IJ SUSP
INTRAMUSCULAR | Status: AC
  Filled 2023-05-25: qty 20

## 2023-05-25 MED ORDER — FENTANYL CITRATE (PF) 100 MCG/2ML IJ SOLN
INTRAMUSCULAR | Status: DC | PRN
  Administered 2023-05-25: 100 ug via INTRAVENOUS

## 2023-05-25 SURGICAL SUPPLY — 40 items
APL SKNCLS STERI-STRIP NONHPOA (GAUZE/BANDAGES/DRESSINGS) ×1
BAG COUNTER SPONGE SURGICOUNT (BAG) IMPLANT
BAG SPNG CNTER NS LX DISP (BAG)
BENZOIN TINCTURE PRP APPL 2/3 (GAUZE/BANDAGES/DRESSINGS) ×2 IMPLANT
BLADE SURG 15 STRL LF DISP TIS (BLADE) IMPLANT
BLADE SURG 15 STRL SS (BLADE)
BRIEF MESH DISP LRG (UNDERPADS AND DIAPERS) ×2 IMPLANT
CNTNR URN SCR LID CUP LEK RST (MISCELLANEOUS) ×2 IMPLANT
CONT SPEC 4OZ STRL OR WHT (MISCELLANEOUS) ×1
COVER SURGICAL LIGHT HANDLE (MISCELLANEOUS) ×2 IMPLANT
DRAPE LAPAROTOMY T 102X78X121 (DRAPES) ×2 IMPLANT
ELECT NDL BLADE 2-5/6 (NEEDLE) ×2 IMPLANT
ELECT NEEDLE BLADE 2-5/6 (NEEDLE) ×1 IMPLANT
ELECT REM PT RETURN 15FT ADLT (MISCELLANEOUS) ×2 IMPLANT
GAUZE 4X4 16PLY ~~LOC~~+RFID DBL (SPONGE) ×2 IMPLANT
GAUZE PAD ABD 8X10 STRL (GAUZE/BANDAGES/DRESSINGS) IMPLANT
GAUZE SPONGE 4X4 12PLY STRL (GAUZE/BANDAGES/DRESSINGS) IMPLANT
GLOVE BIO SURGEON STRL SZ7.5 (GLOVE) ×2 IMPLANT
GLOVE INDICATOR 8.0 STRL GRN (GLOVE) ×2 IMPLANT
GOWN STRL REUS W/ TWL XL LVL3 (GOWN DISPOSABLE) ×4 IMPLANT
GOWN STRL REUS W/TWL XL LVL3 (GOWN DISPOSABLE) ×2
KIT BASIN OR (CUSTOM PROCEDURE TRAY) ×2 IMPLANT
KIT TURNOVER KIT A (KITS) IMPLANT
LOOP VESSEL MAXI BLUE (MISCELLANEOUS) IMPLANT
NDL HYPO 22X1.5 SAFETY MO (MISCELLANEOUS) ×2 IMPLANT
NEEDLE HYPO 22X1.5 SAFETY MO (MISCELLANEOUS) ×1 IMPLANT
PACK BASIC VI WITH GOWN DISP (CUSTOM PROCEDURE TRAY) ×2 IMPLANT
PENCIL SMOKE EVACUATOR (MISCELLANEOUS) IMPLANT
SHEARS HARMONIC 9CM CVD (BLADE) IMPLANT
SPIKE FLUID TRANSFER (MISCELLANEOUS) ×2 IMPLANT
SURGILUBE 2OZ TUBE FLIPTOP (MISCELLANEOUS) ×2 IMPLANT
SUT CHROMIC 2 0 SH (SUTURE) ×2 IMPLANT
SUT CHROMIC 3 0 SH 27 (SUTURE) IMPLANT
SUT VIC AB 2-0 SH 27 (SUTURE)
SUT VIC AB 2-0 SH 27X BRD (SUTURE) IMPLANT
SUT VIC AB 2-0 UR6 27 (SUTURE) ×12 IMPLANT
SYR 20ML LL LF (SYRINGE) ×2 IMPLANT
SYR 3ML LL SCALE MARK (SYRINGE) IMPLANT
TOWEL OR 17X26 10 PK STRL BLUE (TOWEL DISPOSABLE) ×2 IMPLANT
TOWEL OR NON WOVEN STRL DISP B (DISPOSABLE) ×2 IMPLANT

## 2023-05-25 NOTE — Anesthesia Postprocedure Evaluation (Signed)
Anesthesia Post Note  Patient: Stephen Shelton  Procedure(s) Performed: TRANSANAL EXCISION OF ANORECTAL LESION ANORECTAL EXAM UNDER ANESTHESIA     Patient location during evaluation: PACU Anesthesia Type: General Level of consciousness: awake and alert Pain management: pain level controlled Vital Signs Assessment: post-procedure vital signs reviewed and stable Respiratory status: spontaneous breathing, nonlabored ventilation and respiratory function stable Cardiovascular status: blood pressure returned to baseline and stable Postop Assessment: no apparent nausea or vomiting Anesthetic complications: no  No notable events documented.  Last Vitals:  Vitals:   05/25/23 1015 05/25/23 1019  BP: (!) 142/73 137/85  Pulse: 64 (!) 59  Resp: (!) 21 11  Temp:  36.6 C  SpO2: 99% 98%    Last Pain:  Vitals:   05/25/23 1019  TempSrc:   PainSc: 0-No pain                 Dyer Klug,W. EDMOND

## 2023-05-25 NOTE — Transfer of Care (Signed)
Immediate Anesthesia Transfer of Care Note  Patient: Stephen Shelton  Procedure(s) Performed: TRANSANAL EXCISION OF ANORECTAL LESION ANORECTAL EXAM UNDER ANESTHESIA  Patient Location: PACU  Anesthesia Type:General  Level of Consciousness: sedated  Airway & Oxygen Therapy: Patient Spontanous Breathing and Patient connected to face mask oxygen  Post-op Assessment: Report given to RN and Post -op Vital signs reviewed and stable  Post vital signs: Reviewed and stable  Last Vitals:  Vitals Value Taken Time  BP 130/70 05/25/23 0938  Temp    Pulse 65 05/25/23 0939  Resp 13 05/25/23 0939  SpO2 100 % 05/25/23 0939  Vitals shown include unvalidated device data.  Last Pain:  Vitals:   05/25/23 0648  TempSrc:   PainSc: 0-No pain         Complications: No notable events documented.

## 2023-05-25 NOTE — Anesthesia Procedure Notes (Signed)
Procedure Name: Intubation Date/Time: 05/25/2023 8:37 AM  Performed by: Doran Clay, CRNAPre-anesthesia Checklist: Patient identified, Emergency Drugs available, Suction available, Patient being monitored and Timeout performed Patient Re-evaluated:Patient Re-evaluated prior to induction Oxygen Delivery Method: Circle system utilized Preoxygenation: Pre-oxygenation with 100% oxygen Induction Type: IV induction Ventilation: Mask ventilation without difficulty Laryngoscope Size: Mac and 4 Grade View: Grade I Tube type: Oral Tube size: 7.5 mm Number of attempts: 1 Airway Equipment and Method: Stylet Placement Confirmation: ETT inserted through vocal cords under direct vision, breath sounds checked- equal and bilateral and positive ETCO2 Secured at: 23 cm Tube secured with: Tape Dental Injury: Teeth and Oropharynx as per pre-operative assessment

## 2023-05-25 NOTE — H&P (Signed)
CC: Here today for surgery  HPI: Stephen Shelton is an 67 y.o. male with history of HTN, HLD, CAD (Plavix), whom is seen in the office today as a referral by Dr. 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   He underwent colonoscopy 02/01/2023 with Dr. Lavon Paganini for screening purposes - Nodular mucosa in the cecum and at the appendiceal orifice. Biopsied. - Two 4 to 7 mm polyps in the sigmoid colon and in the transverse colon, removed with a cold snare. Resected and retrieved. - Likely benign polypoid lesion in the distal rectum. Biopsied. - Diverticulosis in the sigmoid colon, in the descending colon, in the transverse colon and in the ascending colon. - Non-bleeding external and internal hemorrhoids. - The GI Genius (intelligent endoscopy module), computer-aided polyp detection system powered by AI was utilized to detect colorectal polyps through enhanced visualization during colonoscopy.  PATH 1. Surgical [P], colon, cecum - TUBULAR ADENOMA(S). - NO HIGH GRADE DYSPLASIA OR MALIGNANCY. 2. Surgical [P], colon, transverse, polyp (1) - TUBULAR ADENOMA. - NO HIGH GRADE DYSPLASIA OR MALIGNANCY. 3. Surgical [P], colon, sigmoid, polyp (1) - HYPERPLASTIC POLYP. - NO DYSPLASIA OR MALIGNANCY. 4. Surgical [P], colon, anal canal biopsies - LOW-GRADE SQUAMOUS INTRAEPITHELIAL LESION (AIN 1)  He was subsequently referred to see [place] for consideration of appendectomy with cuff of cecum and evaluation of his LGSIL.  Reviewed photos of his colonoscopy and this mucosa does appear to be confined to his appendiceal orifice at least by picture #1. Picture #2 shows no clear pathology by my evaluation.  He does have a history however of attempted appendectomy back in the early 90s presumably at [facility] long [facility] but unsure. He reports a perforated appendix with a large abscess the size of a softball. He reports they were told that no appendix could be identified and a drain was ultimately  placed.  INTERVAL HX OR 04/25/23 -  Laparoscopic appendectomy Laparoscopic lysis of adhesions x 55 minutes  FINDINGS: Adhesions in the right lower quadrant from prior perforated appendicitis/surgery including omentum, cecum, distal and terminal ileum. All lysed laparoscopically. The appendix was ultimately visualized and somewhat diminutive as would be expected based on CT scan. We were able to dissect this free of the surrounding distal ileum with a nice cuff of cecum between this and the ileocecal valve. Therefore, an appendectomy including a cuff of cecum was undertaken. Specimen opened on back table and there was no gross polypoid involvement of our staple line.   PATH A. APPENDIX WITH CUFF OF CECUM, APPENDECTOMY:  - Colonic mucosa with lymphoid aggregates.  - Appendix with extensive serosal adhesions.   Note: It is noted that the patient has a history of a cecal tubular  adenoma ([id number]). There is no residual adenomatous epithelium  present in the specimen. The specimen is submitted in its entirety.   Reviewed all the above with him and with Dr. Lavon Paganini. Planning surveillance colonoscopy in approximately 6 months to ensure no residual adenomatous type tissue. Based on the pictures endoscopically and the amount we removed with the OR, suspect we actually remove the area in question.  He has been doing great. Has no complaints at present. No abdominal pain, fever, chills, nausea, vomiting, or blood in his stool. Denies any anorectal pain.   He denies any changes in health or health history since we met in the office. No new medications/allergies. He states he is ready for surgery today.  Past Medical History:  Diagnosis Date   Coronary artery disease  Diabetes mellitus (HCC)    diet controlled   Hyperlipidemia    Hypertension    Myocardial infarction [facility])    11-25-2021   CABG   Pneumonia    Septic arthritis (HCC)    R shoulder, history of   Smoker     Past Surgical  History:  Procedure Laterality Date   APPENDECTOMY     COLON SURGERY     CORONARY ARTERY BYPASS GRAFT Right 11/25/2021   Procedure: CORONARY ARTERY BYPASS GRAFTING (CABG) X 4  ON PUMP USING LEFT INTERNAL MAMMARY ARTERY AND RIGHT ENDOSCOPIC GREATER SAPHENOUS VEIN CONDUITS;  Surgeon: Loreli Slot, MD;  Location: [facility] OR;  Service: Open Heart Surgery;  Laterality: Right;  Right radial artery harvest   ENDOVEIN HARVEST OF GREATER SAPHENOUS VEIN Right 11/25/2021   Procedure: ENDOVEIN HARVEST OF GREATER SAPHENOUS VEIN;  Surgeon: Loreli Slot, MD;  Location: [facility] OR;  Service: Open Heart Surgery;  Laterality: Right;   LAPAROSCOPIC APPENDECTOMY N/A 04/25/2023   Procedure: LAPAROSCOPIC APPENDECTOMY WITH LYSIS OF ADHESIONS;  Surgeon: Andria Meuse, MD;  Location: [facility] ORS;  Service: General;  Laterality: N/A;   LEFT HEART CATH AND CORONARY ANGIOGRAPHY N/A 11/24/2021   Procedure: LEFT HEART CATH AND CORONARY ANGIOGRAPHY;  Surgeon: Elder Negus, MD;  Location: [facility] INVASIVE CV LAB;  Service: Cardiovascular;  Laterality: N/A;   PILONIDAL CYST EXCISION  1974   septic arthritis shoulder Right     Family History  Problem Relation Age of Onset   Cancer Mother        multiple myeloma   Colon polyps Mother    Diabetes Father    Autoimmune disease Father        liver and renal disease   Colon polyps Father    Heart attack Brother    Colon polyps Brother    Diabetes Maternal Grandfather    Colon cancer Neg Hx    Prostate cancer Neg Hx    Rectal cancer Neg Hx    Stomach cancer Neg Hx     Social:  reports that he quit smoking about 18 months ago. His smoking use included cigarettes. He has a 16.50 pack-year smoking history. He has never used smokeless tobacco. He reports that he does not currently use alcohol. He reports that he does not use drugs.  Allergies:  Allergies  Allergen Reactions   Metformin And Related Other (See Comments)    abd pain with med use   Sulfa  Antibiotics Other (See Comments)    Lightheaded, intolerant, "felt sick"    Medications: I have reviewed the patient's current medications.  Results for orders placed or performed during the hospital encounter of 05/25/23 (from the past 48 hour(s))  Glucose, capillary     Status: Abnormal   Collection Time: 05/25/23  6:40 AM  Result Value Ref Range   Glucose-Capillary 138 (H) 70 - 99 mg/dL    Comment: Glucose reference range applies only to samples taken after fasting for at least 8 hours.   Comment 1 Notify RN    Comment 2 Document in Chart     No results found.   PE Blood pressure 132/75, pulse (!) 58, temperature 97.8 F (36.6 C), temperature source Oral, resp. rate 17, height 5\' 11"  (1.803 m), weight 97.5 kg, SpO2 97 %. Constitutional: NAD; conversant Eyes: Moist conjunctiva; no lid lag; anicteric Lungs: Normal respiratory effort CV: RRR GI: Abd soft, NT/ND.  Historical exam, not repeated today - Anorectal: Normal perianal skin with soft tags.  DRE-normal tone/squeeze, no palpable masses. Anoscopy: Circumferential anoscopy demonstrates healthy appearing anoderm. Midline appearance of hypertrophic anal papilla. No gross features of any sort of ulceration, mass, or obvious condyloma per se)  Psychiatric: Appropriate affect  Results for orders placed or performed during the hospital encounter of 05/25/23 (from the past 48 hour(s))  Glucose, capillary     Status: Abnormal   Collection Time: 05/25/23  6:40 AM  Result Value Ref Range   Glucose-Capillary 138 (H) 70 - 99 mg/dL    Comment: Glucose reference range applies only to samples taken after fasting for at least 8 hours.   Comment 1 Notify RN    Comment 2 Document in Chart     No results found.  A/P: Stephen Shelton is an 67 y.o. 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  -Cardiac clearance from Dr.  Odis Hollingshead previously in place.  -We spent time reviewing his procedure, findings, pathology, and plans moving forward. Based on the appearance endoscopically and the surgery we did, we did remove a cuff of cecum with the appendix and clearly this polypoid lesion was involving the orifice. We discussed close interval follow-up colonoscopy in approximately 6 months. All this was conveyed to Dr. Lavon Paganini and she was planning to repeat at that time.  -The anatomy and physiology of the anal canal was discussed with the patient with associated pictures. The pathophysiology of anal canal lesions including low-grade squamous intraepithelial lesions was discussed with associated pictures and illustrations. -We have reviewed options going forward including further observation vs surgery -transanal excision of anal canal lesion under anoscopy; anorectal exam under anesthesia -The planned procedure, material risks (including, but not limited to, pain, bleeding, infection, scarring, need for blood transfusion, damage to anal sphincter, incontinence of gas and/or stool, need for additional procedures, anal stenosis, rare cases of pelvic sepsis which in severe cases may require things like a colostomy, recurrence, pneumonia, heart attack, stroke, death) benefits and alternatives to surgery were discussed at length. I noted a good probability that the procedure would help improve their symptoms. The patient's questions were answered to his satisfaction, he voiced understanding and elected to proceed with surgery. Additionally, we discussed typical postoperative expectations and the recovery process.  -He does have a history of genital warts in the past. 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 He is aware of this. He understands that this is a sexually transmitted infections and importance of safe sex practices.    Marin Olp,  MD [facility]

## 2023-05-25 NOTE — Discharge Instructions (Signed)
ANORECTAL SURGERY: POST OP INSTRUCTIONS  DIET: Follow a light bland diet the first 24 hours after arrival home, such as soup, liquids, crackers, etc.  Be sure to include lots of fluids daily.  Avoid fast food or heavy meals as your are more likely to get nauseated.  Eat a low fat diet the next few days after surgery.   Some bleeding with bowel movements is expected for the first couple of days but this should stop in between bowel movements  Take your usually prescribed home medications unless otherwise directed. No foreign bodies per rectum for the next 3 months (enemas, etc)  PAIN CONTROL: It is helpful to take an over-the-counter pain medication regularly for the first few days/weeks.  Choose from the following that works best for you: Ibuprofen (Advil, etc) Three 200mg tabs every 6 hours as needed. Acetaminophen (Tylenol, etc) 500-650mg every 6 hours as needed NOTE: You may take both of these medications together - most patients find it most helpful when alternating between the two (i.e. Ibuprofen at 6am, tylenol at 9am, ibuprofen at 12pm ...) A  prescription for pain medication may have been prescribed for you at discharge.  Take your pain medication as prescribed.  If you are having problems/concerns with the prescription medicine, please call us for further advice.  Avoid getting constipated.  Between the surgery and the pain medications, it is common to experience some constipation.  Increasing fluid intake (64oz of water per day) and taking a fiber supplement (such as Metamucil, Citrucel, FiberCon) 1-2 times a day regularly will usually help prevent this problem from occurring.  Take Miralax (over the counter) 1-2x/day while taking a narcotic pain medication. If no bowel movement after 48hours, you may additionally take a laxative like a bottle of Milk of Magnesia which can be purchased over the counter. Avoid enemas.   Watch out for diarrhea.  If you have many loose bowel movements,  simplify your diet to bland foods.  Stop any stool softeners and decrease your fiber supplement. If this worsens or does not improve, please call us.  Wash / shower every day.  If you were discharged with a dressing, you may remove this the day after your surgery. You may shower normally, getting soap/water on your wound, particularly after bowel movements.  Soaking in a warm bath filled a couple inches ("Sitz bath") is a great way to clean the area after a bowel movement and many patients find it is a way to soothe the area.  ACTIVITIES as tolerated:   You may resume regular (light) daily activities beginning the next day--such as daily self-care, walking, climbing stairs--gradually increasing activities as tolerated.  If you can walk 30 minutes without difficulty, it is safe to try more intense activity such as jogging, treadmill, bicycling, low-impact aerobics, etc. Refrain from any heavy lifting or straining for the first 2 weeks after your procedure, particularly if your surgery was for hemorrhoids. Avoid activities that make your pain worse You may drive when you are no longer taking prescription pain medication, you can comfortably wear a seatbelt, and you can safely maneuver your car and apply brakes.  FOLLOW UP in our office Please call [facility] at [phone] to set up an appointment to see your surgeon in the office for a follow-up appointment approximately 2 weeks after your surgery. Make sure that you call for this appointment the day you arrive home to insure a convenient appointment time.  9. If you have disability or family leave forms   that need to be completed, you may have them completed by your primary care physician's office; for return to work instructions, please ask our office staff and they will be happy to assist you in obtaining this documentation   When to call us [phone]: Poor pain control Reactions / problems with new medications (rash/itching, etc)  Fever over  101.5 F (38.5 C) Inability to urinate Nausea/vomiting Worsening swelling or bruising Continued bleeding from incision. Increased pain, redness, or drainage from the incision  The clinic staff is available to answer your questions during regular business hours (8:30am-5pm).  Please don't hesitate to call and ask to speak to one of our nurses for clinical concerns.   A surgeon from [facility] is always on call at the hospitals   If you have a medical emergency, go to the nearest emergency room or call 911.   [facility] [address], [address], [place], [place]  [address] MAIN: [phone] FAX: [phone] [email or web address] 

## 2023-05-25 NOTE — Op Note (Signed)
05/25/2023  9:43 AM  PATIENT:  Stephen Shelton  67 y.o. male  Patient Care Team: Joaquim Nam, MD as PCP - General (Family Medicine) Tessa Lerner, DO as Consulting Physician (Cardiology)  PRE-OPERATIVE DIAGNOSIS: Low-grade squamous intraepithelial lesion  POST-OPERATIVE DIAGNOSIS: Same  PROCEDURE:   1.  Transanal excision of anorectal lesion ~ 1 x 1 cm 2.  Anorectal exam under anesthesia  SURGEON:  Surgeon(s): Andria Meuse, MD  ASSISTANT: OR Staff   ANESTHESIA:   local and general  SPECIMEN: Anterior midline and rectal lesion  DISPOSITION OF SPECIMEN:  PATHOLOGY  COUNTS:  Sponge, needle, and instrument counts were reported correct x2 at conclusion.  EBL: 2 mL  Drains: None  PLAN OF CARE: Discharge to home after PACU  PATIENT DISPOSITION:  PACU - hemodynamically stable.  OR FINDINGS: Normal perianal skin.  Circumferential anoscopy demonstrates the evident lesion to be in the anterior midline.  No other abnormalities noted within the anal canal.  Minimal if any internal hemorrhoidal tissue.  DESCRIPTION: The patient was identified in the preoperative holding area and taken to the OR. SCDs were applied.  He then underwent general endotracheal anesthesia without difficulty. The patient was then rolled onto the OR table in the prone jackknife position. Pressure points were then evaluated and padded.  Right arm was kept in a neutral position.  Benzoin was applied to the buttocks and they were gently taped apart.  He was then prepped and draped in usual sterile fashion.  A surgical timeout was performed indicating the correct patient, procedure, and positioning.  A perianal block was then created using a dilute mixture of 0.25% Marcaine with epinephrine and Exparel.  After ascertaining an appropriate level of anesthesia had been achieved, a well lubricated digital rectal exam was performed. This demonstrated no palpable abnormalities.  Externally, the skin is all  normal, no evident lesions.  A Hill-Ferguson anoscope was into the anal canal and circumferential inspection demonstrated healthy appearing anoderm, minimal if any internal hemorrhoids.  There is an evident heaped up lesion within the anal canal at the anterior midline position.  There is no ulceration or firm masslike extension of this.  This lesion is then elevated with a DeBakey forceps and is approximately 1 x 1 cm in size.  It is circumferentially incised sharply including a rim of normal-appearing tissue with it.  The sphincter muscle was dissected free of this prior to excision.  The lesion was passed off as an anterior midline anal canal lesion.  Hemostasis was then achieved with fulguration of electrocautery.  The defect was then closed using a running 2-0 Vicryl suture.  Additional local anesthetic is infiltrated into the tissues around the excision site.  All sponge, needle, and instrument counts were reported correct.  The buttocks are untaped.  A dressing consisting of 4 x 4's, ABD, and mesh underwear is then placed.  He was then rolled back onto a stretcher, awakened from anesthesia, extubated, and transferred to the recovery room in satisfactory condition.  DISPOSITION: PACU in satisfactory condition.

## 2023-05-26 ENCOUNTER — Other Ambulatory Visit: Payer: Self-pay | Admitting: Family Medicine

## 2023-05-26 ENCOUNTER — Encounter (HOSPITAL_COMMUNITY): Payer: Self-pay | Admitting: Surgery

## 2023-05-26 DIAGNOSIS — E139 Other specified diabetes mellitus without complications: Secondary | ICD-10-CM

## 2023-05-27 LAB — SURGICAL PATHOLOGY

## 2023-06-13 ENCOUNTER — Telehealth: Payer: Self-pay

## 2023-06-13 NOTE — Telephone Encounter (Signed)
Reached out to patient to set up follow up visit for chronic conditions. Patient declined at this time. Stephen Shelton [facility]

## 2023-06-16 ENCOUNTER — Telehealth: Payer: Self-pay | Admitting: Family Medicine

## 2023-06-16 MED ORDER — PEN NEEDLES 30G X 5 MM MISC: 1 refills | Status: DC

## 2023-06-16 MED ORDER — LANTUS SOLOSTAR 100 UNIT/ML ~~LOC~~ SOPN: 5.0000 [IU] | PEN_INJECTOR | Freq: Every day | SUBCUTANEOUS | 1 refills | Status: DC

## 2023-06-16 NOTE — Telephone Encounter (Signed)
Please see about getting patient set up for insulin teaching here in clinic.    I sent the rx in the meantime.    How is his sugar in the meantime?  Goal AM sugar ~100-150.  Would start with 5 units per day, given in the PM.  If AM sugar still above 150, then increase by 1 unit per day.  If AM sugar 100-150, then continue with prev dose.  If AM sugar <100, then decrease by 1 unit.  Thanks.

## 2023-06-16 NOTE — Telephone Encounter (Signed)
Patient called in and stated that he is getting some teeth implants in August. He stated that his A1C was 8.2 June and was wanting to know if Dr. Para March could send him in some insulin to get his A1C down by his dental visit. He stated that he can't get in with the endocrinologist until December. Thank you!

## 2023-06-16 NOTE — Addendum Note (Signed)
Addended by: Joaquim Nam on: 06/16/2023 11:12 PM   Modules accepted: Orders

## 2023-06-17 NOTE — Telephone Encounter (Signed)
Called patient, call was lost due to patient being in a bad service area. Called patient again and gave Dr. Lianne Bushy recommendations about his blood sugar goal with insulin instructions. Patient informed Rx was sent to pharmacy and he is scheduled for a nurse visit for 06/22/23 at 3:30 for insulin teaching

## 2023-06-22 ENCOUNTER — Telehealth: Payer: Self-pay

## 2023-06-22 ENCOUNTER — Ambulatory Visit: Payer: PPO

## 2023-06-22 DIAGNOSIS — E139 Other specified diabetes mellitus without complications: Secondary | ICD-10-CM

## 2023-06-22 NOTE — Telephone Encounter (Signed)
Pt came in this afternoon for a scheduled nurse visit that he did not need as he already has used Lantus in the past. Has already started using it this week. Pt is asking if he could get his A1C checked middle of next week to see if his level is below 8.0 so she can ave his surgical procedure 07-05-23. It will need to be postponed if it is over 8.0. Pt aware it will be early next week before he might hear back from [place] about a lab visit for POC A1C or send off.

## 2023-06-23 NOTE — Addendum Note (Signed)
Addended by: Joaquim Nam on: 06/23/2023 11:57 AM   Modules accepted: Orders

## 2023-06-23 NOTE — Telephone Encounter (Signed)
Thanks.  I put in the order for a POC A1c.  Please update/schedule patient.

## 2023-06-23 NOTE — Telephone Encounter (Signed)
Lvmtcb

## 2023-06-23 NOTE — Telephone Encounter (Signed)
Please call and schedule lab appt for patient

## 2023-06-24 ENCOUNTER — Other Ambulatory Visit (INDEPENDENT_AMBULATORY_CARE_PROVIDER_SITE_OTHER): Payer: PPO

## 2023-06-24 DIAGNOSIS — E139 Other specified diabetes mellitus without complications: Secondary | ICD-10-CM

## 2023-06-24 LAB — POCT GLYCOSYLATED HEMOGLOBIN (HGB A1C): Hemoglobin A1C: 7.3 % — AB (ref 4.0–5.6)

## 2023-06-28 ENCOUNTER — Other Ambulatory Visit: Payer: PPO

## 2023-06-28 ENCOUNTER — Encounter: Payer: Self-pay | Admitting: Cardiology

## 2023-06-29 ENCOUNTER — Encounter: Payer: Self-pay | Admitting: Cardiology

## 2023-06-30 ENCOUNTER — Encounter: Payer: Self-pay | Admitting: Cardiology

## 2023-06-30 ENCOUNTER — Ambulatory Visit: Payer: HMO | Admitting: Cardiology

## 2023-06-30 VITALS — BP 147/88 | HR 62 | Resp 16 | Ht 71.0 in | Wt 213.0 lb

## 2023-06-30 DIAGNOSIS — I2510 Atherosclerotic heart disease of native coronary artery without angina pectoris: Secondary | ICD-10-CM

## 2023-06-30 DIAGNOSIS — I700 Atherosclerosis of aorta: Secondary | ICD-10-CM | POA: Diagnosis not present

## 2023-06-30 DIAGNOSIS — Z01818 Encounter for other preprocedural examination: Secondary | ICD-10-CM | POA: Diagnosis not present

## 2023-06-30 DIAGNOSIS — E139 Other specified diabetes mellitus without complications: Secondary | ICD-10-CM

## 2023-06-30 DIAGNOSIS — Z87891 Personal history of nicotine dependence: Secondary | ICD-10-CM

## 2023-06-30 DIAGNOSIS — I214 Non-ST elevation (NSTEMI) myocardial infarction: Secondary | ICD-10-CM | POA: Diagnosis not present

## 2023-06-30 DIAGNOSIS — Z951 Presence of aortocoronary bypass graft: Secondary | ICD-10-CM

## 2023-06-30 DIAGNOSIS — I10 Essential (primary) hypertension: Secondary | ICD-10-CM

## 2023-06-30 NOTE — Progress Notes (Signed)
Date:  06/30/2023   ID:  Stephen Shelton, DOB [birth year], MRN [id number]  PCP:  Stephen Nam, MD  Cardiologist:  Stephen Lerner, DO, [facility] (established care 11/24/2021) Cardiothoracic surgeon: Dr. Dorris Shelton  Date: 06/30/23 Last Office Visit: 09/10/2022  Chief Complaint  Patient presents with   Coronary Artery Disease   Follow-up   Pre-op Exam   HPI  Stephen Shelton is a 67 y.o. 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  Established care and January 2023 when he presented to the ED for chest pain.  His symptoms were very concerning for angina pectoris.  Underwent left heart catheterization and was noted to have obstructive CAD.  He underwent four-vessel CABG with Dr. Dorris Shelton on November 25, 2021.     Patient presents today for preoperative risk stratification.  He plans to have extensive oral/facial surgery including removal of teeth, reduction and smoothing of the jaw, and placement of dental implants.  The surgical team is requesting that he stop Plavix for the upcoming non cardiac surgery.   Clinically denies anginal chest pain or heart failure symptoms.  Overall functional capacity remains stable.  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    ALLERGIES: Allergies  Allergen Reactions   Metformin And Related Other (See Comments)    abd pain with med use   Sulfa Antibiotics Other (See Comments)    Lightheaded, intolerant, "felt sick"    MEDICATION LIST PRIOR TO VISIT: Current Meds  Medication Sig   aspirin 81 MG EC tablet Take 1 tablet (81 mg total) by mouth daily. Swallow whole.   atorvastatin (LIPITOR) 80 MG tablet TAKE 1 TABLET BY MOUTH EVERY DAY   clopidogrel (PLAVIX) 75 MG tablet TAKE 1 TABLET BY MOUTH EVERY DAY   insulin  glargine (LANTUS SOLOSTAR) 100 UNIT/ML Solostar Pen Inject 5-20 Units into the skin daily.   Insulin Pen Needle (PEN NEEDLES) 30G X 5 MM MISC Use with insulin injection   lisinopril (ZESTRIL) 10 MG tablet Take 1 tablet (10 mg total) by mouth every morning.   Metoprolol Tartrate 37.5 MG TABS TAKE 1 TABLET BY MOUTH TWICE A DAY   Multiple Vitamin (MULTIVITAMIN) tablet Take 1 tablet by mouth daily.   nitroGLYCERIN (NITROSTAT) 0.4 MG SL tablet Place 1 tablet (0.4 mg total) under the tongue every 5 (five) minutes as needed for chest pain.     PAST MEDICAL HISTORY: Past Medical History:  Diagnosis Date   Coronary artery disease    Diabetes mellitus (HCC)    diet controlled   Hyperlipidemia    Hypertension    Myocardial infarction [facility])    11-25-2021   CABG   Pneumonia    Septic arthritis (HCC)    R shoulder, history of   Smoker     PAST SURGICAL HISTORY: Past Surgical History:  Procedure Laterality Date   APPENDECTOMY     COLON SURGERY     CORONARY ARTERY BYPASS GRAFT Right 11/25/2021   Procedure: CORONARY ARTERY BYPASS GRAFTING (CABG) X 4  ON PUMP USING LEFT INTERNAL MAMMARY ARTERY AND RIGHT ENDOSCOPIC GREATER SAPHENOUS VEIN CONDUITS;  Surgeon: Stephen Slot, MD;  Location: [facility] OR;  Service: Open Heart Surgery;  Laterality: Right;  Right radial artery harvest   ENDOVEIN HARVEST OF GREATER SAPHENOUS VEIN Right 11/25/2021   Procedure: ENDOVEIN  HARVEST OF GREATER SAPHENOUS VEIN;  Surgeon: Stephen Slot, MD;  Location: [facility] OR;  Service: Open Heart Surgery;  Laterality: Right;   LAPAROSCOPIC APPENDECTOMY N/A 04/25/2023   Procedure: LAPAROSCOPIC APPENDECTOMY WITH LYSIS OF ADHESIONS;  Surgeon: Stephen Meuse, MD;  Location: [facility] ORS;  Service: General;  Laterality: N/A;   LEFT HEART CATH AND CORONARY ANGIOGRAPHY N/A 11/24/2021   Procedure: LEFT HEART CATH AND CORONARY ANGIOGRAPHY;  Surgeon: Stephen Negus, MD;  Location: [facility] INVASIVE CV LAB;  Service: Cardiovascular;   Laterality: N/A;   PILONIDAL CYST EXCISION  1974   RECTAL EXAM UNDER ANESTHESIA N/A 05/25/2023   Procedure: ANORECTAL EXAM UNDER ANESTHESIA;  Surgeon: Stephen Meuse, MD;  Location: [facility] ORS;  Service: General;  Laterality: N/A;   septic arthritis shoulder Right    TRANSANAL EXCISION OF RECTAL MASS N/A 05/25/2023   Procedure: TRANSANAL EXCISION OF ANORECTAL LESION;  Surgeon: Stephen Meuse, MD;  Location: [facility] ORS;  Service: General;  Laterality: N/A;    FAMILY HISTORY: The patient family history includes Autoimmune disease in his father; Cancer in his mother; Colon polyps in his brother, father, and mother; Diabetes in his father and maternal grandfather; Heart attack in his brother.  SOCIAL HISTORY:  The patient  reports that he quit smoking about 19 months ago. His smoking use included cigarettes. He started smoking about 34 years ago. He has a 16.5 pack-year smoking history. He has never used smokeless tobacco. He reports that he does not currently use alcohol. He reports that he does not use drugs.  REVIEW OF SYSTEMS: Review of Systems  Cardiovascular:  Negative for chest pain, dyspnea on exertion, leg swelling, near-syncope, orthopnea, palpitations, paroxysmal nocturnal dyspnea and syncope.  Respiratory:  Negative for shortness of breath.     PHYSICAL EXAM:    06/30/2023    9:22 AM 05/25/2023   10:19 AM 05/25/2023   10:15 AM  Vitals with BMI  Height 5\' 11"     Weight 213 lbs    BMI 29.72    Systolic 157 137 277  Diastolic 88 85 73  Pulse 62 59 64   Physical Exam  Constitutional: No distress.  Age appropriate, hemodynamically stable.   Neck: No JVD present.  Cardiovascular: Normal rate, regular rhythm, S1 normal, S2 normal, intact distal pulses and normal pulses. Exam reveals no gallop, no S3 and no S4.  No murmur heard. Pulmonary/Chest: Effort normal and breath sounds normal. No stridor. He has no wheezes. He has no rales.  Sternotomy site well-healed.  Clean dry and  intact.  Abdominal: Soft. Bowel sounds are normal. He exhibits no distension. There is no abdominal tenderness.  Musculoskeletal:        General: No edema.     Cervical back: Neck supple.  Neurological: He is alert and oriented to person, place, and time. He has intact cranial nerves (2-12).  Skin: Skin is warm and moist.   CARDIAC DATABASE: EKG: June 30, 2023: Sinus rhythm, 60 bpm, without underlying ischemia injury pattern.  Echocardiogram: 11/24/2021:  1. Left ventricular ejection fraction, by estimation, is 50 to 55%. The  left ventricle has low normal function. The left ventricle has no regional wall motion abnormalities. There is mild left ventricular hypertrophy.  Left ventricular diastolic parameters are consistent with Grade II diastolic dysfunction (pseudonormalization). Elevated left atrial pressure.   2. Right ventricular systolic function is normal. The right ventricular size is normal.   3. Left atrial size was mildly dilated.   4. The mitral  valve is degenerative. Mild mitral valve regurgitation. No evidence of mitral stenosis.   5. The aortic valve is tricuspid. Aortic valve regurgitation is not visualized. Aortic valve sclerosis/calcification is present, without any evidence of aortic stenosis.    Stress test: None   Heart catheterization: November 24, 2021: LM: Normal LAD: Complex mid LAD/Diag2/septal 90% trifurcation lesion ([place] 1,1,1) Lcx: Prox OM2 80% stenosis RCA: Prox CTO. Epicardial right-to-right and left-to-right collaterals, mostly from LAD septals   LVEDP normal LVEF by LV gram 50-55%.    Continue Aspirin, heparin, statin, blood pressure control CVTS consulted for CABG   Carotid duplex: 11/24/2021: Right Carotid: Velocities in the right ICA are consistent with a 1-39% stenosis.  Left Carotid: Velocities in the left ICA are consistent with a 1-39% stenosis.  Vertebrals:  Bilateral vertebral arteries demonstrate antegrade flow.  Subclavians: Left  subclavian artery was stenotic. Normal flow hemodynamics were seen in the right subclavian artery.    Ankle-brachial index: 11/24/2021: Right ABI: Triphasic waveforms observed in PTA and DPA.  Left ABI: Area of stenosis observed at distal PTA with velocities of 323 cm/s.   Upper extremity arterial duplex: 11/24/2021: Right Upper Extremity: Doppler waveforms remain within normal limits with right radial compression. Doppler waveform obliterate with right ulnar compression.  Left Upper Extremity: Doppler waveform obliterate with left radial compression. Doppler waveforms remain within normal limits with left ulnar compression.  LABORATORY DATA:    Latest Ref Rng & Units 05/23/2023    8:17 AM 04/21/2023    9:34 AM 12/22/2022    7:35 AM  CBC  WBC 4.0 - 10.5 K/uL 8.9  10.4  8.4   Hemoglobin 13.0 - 17.0 g/dL 41.3  24.4  01.0   Hematocrit 39.0 - 52.0 % 44.0  47.7  42.9   Platelets 150 - 400 K/uL 192  215  211.0        Latest Ref Rng & Units 05/13/2023    8:27 AM 04/21/2023    9:34 AM 12/22/2022    7:35 AM  CMP  Glucose 70 - 99 mg/dL 272  536  644   BUN 6 - 23 mg/dL 13  13  13    Creatinine 0.40 - 1.50 mg/dL 0.34  7.42  5.95   Sodium 135 - 145 mEq/L 141  139  136   Potassium 3.5 - 5.1 mEq/L 4.5  4.8  4.5   Chloride 96 - 112 mEq/L 103  107  101   CO2 19 - 32 mEq/L 30  26  28    Calcium 8.4 - 10.5 mg/dL 9.6  9.1  9.0   Total Protein 6.0 - 8.3 g/dL 6.5   6.1   Total Bilirubin 0.2 - 1.2 mg/dL 1.0   0.6   Alkaline Phos 39 - 117 U/L 71   79   AST 0 - 37 U/L 20   13   ALT 0 - 53 U/L 20   18     Lipid Panel  Lab Results  Component Value Date   CHOL 88 05/13/2023   HDL 36.50 (L) 05/13/2023   LDLCALC 32 05/13/2023   LDLDIRECT 36 03/09/2022   TRIG 95.0 05/13/2023   CHOLHDL 2 05/13/2023    No components found for: "NTPROBNP" No results for input(s): "PROBNP" in the last 8760 hours.  No results for input(s): "TSH" in the last 8760 hours.   BMP Recent Labs    12/22/22 0735  04/21/23 0934 05/13/23 0827  NA 136 139 141  K 4.5 4.8 4.5  CL 101 107 103  CO2 28 26 30   GLUCOSE 225* 160* 144*  BUN 13 13 13   CREATININE 0.73 0.66 0.73  CALCIUM 9.0 9.1 9.6  GFRNONAA  --  >60  --     HEMOGLOBIN A1C Lab Results  Component Value Date   HGBA1C 7.3 (A) 06/24/2023   MPG 189 04/21/2023    IMPRESSION:    ICD-10-CM   1. Pre-op evaluation  Z01.818 EKG 12-Lead    2. Coronary artery disease involving native coronary artery of native heart without angina pectoris  I25.10     3. Hx of CABG  Z95.1     4. Non-ST elevation (NSTEMI) myocardial infarction (HCC)  I21.4     5. 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) (HCC)  E13.9     6. Benign hypertension  I10     7. Aortic atherosclerosis (HCC)  I70.0     8. Former smoker  Z87.891        RECOMMENDATIONS: ARKEL CANCELLIERE is a 67 y.o. 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  Pre-op evaluation Patient is considered to be acceptable risk for upcoming noncardiac surgery. Letter has been sent to his surgical team. He can stop aspirin and Plavix at least 7 days prior to surgery and resume when hemodynamically stable and appropriate hemostasis is achieved.  Coronary artery disease involving native coronary artery of native heart without angina pectoris Hx of CABG Non-ST elevation (NSTEMI) myocardial infarction [facility]) Denies anginal chest pain or heart failure symptoms. Presented to [facility] in January 2023 ruled in for NSTEMI. Noted to have multivessel CAD status post four-vessel bypass January 2023 (LIMA to LAD, SVG to diagonal, SVG to OM, SVG to PDA). No sublingual nitroglycerin tablets since last office encounter. EKG today is nonischemic. Overall functional capacity remains  stable.  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) [facility]) Understands the importance of glycemic control.  Benign hypertension Office blood pressure is acceptable. Currently managed by primary care provider.   FINAL MEDICATION LIST END OF ENCOUNTER: No orders of the defined types were placed in this encounter.   There are no discontinued medications.    Current Outpatient Medications:    aspirin 81 MG EC tablet, Take 1 tablet (81 mg total) by mouth daily. Swallow whole., Disp: 90 tablet, Rfl: 3   atorvastatin (LIPITOR) 80 MG tablet, TAKE 1 TABLET BY MOUTH EVERY DAY, Disp: 90 tablet, Rfl: 0   clopidogrel (PLAVIX) 75 MG tablet, TAKE 1 TABLET BY MOUTH EVERY DAY, Disp: 90 tablet, Rfl: 3   insulin glargine (LANTUS SOLOSTAR) 100 UNIT/ML Solostar Pen, Inject 5-20 Units into the skin daily., Disp: 15 mL, Rfl: 1   Insulin Pen Needle (PEN NEEDLES) 30G X 5 MM MISC, Use with insulin injection, Disp: 100 each, Rfl: 1   lisinopril (ZESTRIL) 10 MG tablet, Take 1 tablet (10 mg total) by mouth every morning., Disp: 90 tablet, Rfl: 3   Metoprolol Tartrate 37.5 MG TABS, TAKE 1 TABLET BY MOUTH TWICE A DAY, Disp: 180 tablet, Rfl: 3   Multiple Vitamin (MULTIVITAMIN) tablet, Take 1 tablet by mouth daily., Disp: , Rfl:    nitroGLYCERIN (NITROSTAT) 0.4 MG SL tablet, Place 1 tablet (0.4 mg total) under the tongue every 5 (five) minutes as needed for chest pain., Disp: 50 tablet, Rfl: 3  Orders Placed This Encounter  Procedures   EKG 12-Lead    There are no Patient Instructions on file for this visit.   --Continue cardiac medications as reconciled in final medication list. --Return in about 6 months (around 12/31/2023) for Follow up, CAD. Or sooner if needed. --Continue follow-up with your primary care physician regarding the management of your other chronic comorbid conditions.  Patient's questions and concerns were addressed to his satisfaction. He voices understanding of the instructions  provided during this encounter.   This note was created using a voice recognition software as a result there may be grammatical errors inadvertently enclosed that do not reflect the nature of this encounter. Every attempt is made to correct such errors.  Stephen Shelton, [place], [facility]  Pager:  [phone] Office: [phone]

## 2023-07-06 NOTE — Telephone Encounter (Signed)
done

## 2023-07-12 ENCOUNTER — Other Ambulatory Visit: Payer: Self-pay | Admitting: Cardiology

## 2023-07-12 DIAGNOSIS — Z951 Presence of aortocoronary bypass graft: Secondary | ICD-10-CM

## 2023-07-12 DIAGNOSIS — I10 Essential (primary) hypertension: Secondary | ICD-10-CM

## 2023-07-12 DIAGNOSIS — I2510 Atherosclerotic heart disease of native coronary artery without angina pectoris: Secondary | ICD-10-CM

## 2023-07-13 ENCOUNTER — Encounter (INDEPENDENT_AMBULATORY_CARE_PROVIDER_SITE_OTHER): Payer: PPO | Admitting: Ophthalmology

## 2023-08-04 NOTE — Telephone Encounter (Signed)
This encounter was created in error - please disregard.

## 2023-08-05 ENCOUNTER — Other Ambulatory Visit: Payer: Self-pay | Admitting: Cardiology

## 2023-08-17 ENCOUNTER — Other Ambulatory Visit: Payer: Self-pay | Admitting: Family Medicine

## 2023-10-14 ENCOUNTER — Encounter (INDEPENDENT_AMBULATORY_CARE_PROVIDER_SITE_OTHER): Payer: PPO | Admitting: Ophthalmology

## 2023-10-14 DIAGNOSIS — E113592 Type 2 diabetes mellitus with proliferative diabetic retinopathy without macular edema, left eye: Secondary | ICD-10-CM | POA: Diagnosis not present

## 2023-10-14 DIAGNOSIS — D3132 Benign neoplasm of left choroid: Secondary | ICD-10-CM

## 2023-10-14 DIAGNOSIS — E113391 Type 2 diabetes mellitus with moderate nonproliferative diabetic retinopathy without macular edema, right eye: Secondary | ICD-10-CM | POA: Diagnosis not present

## 2023-10-14 DIAGNOSIS — H43813 Vitreous degeneration, bilateral: Secondary | ICD-10-CM

## 2023-10-14 DIAGNOSIS — Z7984 Long term (current) use of oral hypoglycemic drugs: Secondary | ICD-10-CM

## 2023-10-14 DIAGNOSIS — H35033 Hypertensive retinopathy, bilateral: Secondary | ICD-10-CM

## 2023-10-14 DIAGNOSIS — I10 Essential (primary) hypertension: Secondary | ICD-10-CM

## 2023-11-04 ENCOUNTER — Ambulatory Visit: Payer: PPO | Admitting: Internal Medicine

## 2023-11-04 ENCOUNTER — Encounter: Payer: Self-pay | Admitting: Internal Medicine

## 2023-11-04 VITALS — BP 130/80 | HR 82 | Ht 71.0 in | Wt 217.0 lb

## 2023-11-04 DIAGNOSIS — E139 Other specified diabetes mellitus without complications: Secondary | ICD-10-CM

## 2023-11-04 DIAGNOSIS — E10319 Type 1 diabetes mellitus with unspecified diabetic retinopathy without macular edema: Secondary | ICD-10-CM | POA: Diagnosis not present

## 2023-11-04 DIAGNOSIS — E1042 Type 1 diabetes mellitus with diabetic polyneuropathy: Secondary | ICD-10-CM | POA: Diagnosis not present

## 2023-11-04 DIAGNOSIS — E1059 Type 1 diabetes mellitus with other circulatory complications: Secondary | ICD-10-CM

## 2023-11-04 LAB — POCT GLYCOSYLATED HEMOGLOBIN (HGB A1C): Hemoglobin A1C: 6.9 % — AB (ref 4.0–5.6)

## 2023-11-04 MED ORDER — GLIMEPIRIDE 2 MG PO TABS: 2.0000 mg | ORAL_TABLET | Freq: Every day | ORAL | 3 refills | Status: DC

## 2023-11-04 NOTE — Progress Notes (Signed)
Name: Stephen Shelton  Age/ Sex: 67 y.o., male   MRN/ DOB: [id number], [birth year]     PCP: Joaquim Nam, MD   Reason for Endocrinology Evaluation: Type 2 Diabetes Mellitus  Initial Endocrine Consultative Visit: 08/27/2020    PATIENT IDENTIFIER: Stephen Shelton is a 67 y.o. male with a past medical history of T2DM, HTN, CAD (S/P CABG). The patient has followed with Endocrinology clinic since 08/27/2020 for consultative assistance with management of his diabetes.  DIABETIC HISTORY:  Stephen Shelton was diagnosed with T2DM in 2004, but this was changed to LADA by 06/2020 after he was found to have an elevated GAD-65 at 89.7 U/mL .   He was also checked for C-Peptide in 06/2020 at 2.6 ng/mL with serum glucose 121 mg/dL    He was initially on Metformin but this  caused Abdominal pain. His hemoglobin A1c has ranged from 5.9% in 2021, peaking at 10.9% in 2019.   He was on insulin from 12/2017 until 12/2019, he was taken off insulin by  [facility] health program  ( which is a keto diet) and after losing   30 lbs .    He is on  Creon for pancreatic insufficiency , has been having loose stools since 2019 years which has helped. Celiac test negative.    Maternal Grandfather with DM Father with DM  Daughter with T1DM and thyroid disease    Started on Lantus 06/2023 through his PCPs office, but this was discontinued 10/2023 due to inconsistent intake and started on glimepiride SUBJECTIVE:   During the last visit (02/22/2022): A1c 7.2 %    Today (11/04/2023): Stephen Shelton is here for a follow up on diabetes care. He has NOT been to our clinic in 20 months.  He checks his blood sugars 2 times daily. The patient has  had  not hypoglycemic episodes since the last clinic visit    He is s/p CABG 11/2021 after he presented to the ED for non-ST elevation MI Has chronic loose stools, denies nausea or vomiting   He doesn't use insulin daily   HOME DIABETES REGIMEN:  Lantus 5-20 units   Statin:  yes ACE-I/ARB: yes    METER DOWNLOAD SUMMARY:  [id number]    DIABETIC COMPLICATIONS: Microvascular complications:  S/P laser treatment for DR Denies: CKD,  neuropathy  Last Eye Exam: Completed 2022  Macrovascular complications:  Non-ST elevation MI, S/P CABG ( 11/2021) Denies: CVA, PVD   HISTORY:  Past Medical History:  Past Medical History:  Diagnosis Date   Coronary artery disease    Diabetes mellitus (HCC)    diet controlled   Hyperlipidemia    Hypertension    Myocardial infarction (HCC)    11-25-2021   CABG   Pneumonia    Septic arthritis (HCC)    R shoulder, history of   Smoker    Past Surgical History:  Past Surgical History:  Procedure Laterality Date   APPENDECTOMY     COLON SURGERY     CORONARY ARTERY BYPASS GRAFT Right 11/25/2021   Procedure: CORONARY ARTERY BYPASS GRAFTING (CABG) X 4  ON PUMP USING LEFT INTERNAL MAMMARY ARTERY AND RIGHT ENDOSCOPIC GREATER SAPHENOUS VEIN CONDUITS;  Surgeon: Loreli Slot, MD;  Location: [facility] OR;  Service: Open Heart Surgery;  Laterality: Right;  Right radial artery harvest   ENDOVEIN HARVEST OF GREATER SAPHENOUS VEIN Right 11/25/2021   Procedure: ENDOVEIN HARVEST OF GREATER SAPHENOUS VEIN;  Surgeon: Loreli Slot, MD;  Location: [facility] OR;  Service: Open  Heart Surgery;  Laterality: Right;   LAPAROSCOPIC APPENDECTOMY N/A 04/25/2023   Procedure: LAPAROSCOPIC APPENDECTOMY WITH LYSIS OF ADHESIONS;  Surgeon: Andria Meuse, MD;  Location: [facility] ORS;  Service: General;  Laterality: N/A;   LEFT HEART CATH AND CORONARY ANGIOGRAPHY N/A 11/24/2021   Procedure: LEFT HEART CATH AND CORONARY ANGIOGRAPHY;  Surgeon: Elder Negus, MD;  Location: [facility] INVASIVE CV LAB;  Service: Cardiovascular;  Laterality: N/A;   PILONIDAL CYST EXCISION  1974   RECTAL EXAM UNDER ANESTHESIA N/A 05/25/2023   Procedure: ANORECTAL EXAM UNDER ANESTHESIA;  Surgeon: Andria Meuse, MD;  Location: [facility] ORS;  Service: General;  Laterality: N/A;    septic arthritis shoulder Right    TRANSANAL EXCISION OF RECTAL MASS N/A 05/25/2023   Procedure: TRANSANAL EXCISION OF ANORECTAL LESION;  Surgeon: Andria Meuse, MD;  Location: [facility] ORS;  Service: General;  Laterality: N/A;   Social History:  reports that he quit smoking about 1 years ago. His smoking use included cigarettes. He started smoking about 34 years ago. He has a 16.5 pack-year smoking history. He has never used smokeless tobacco. He reports that he does not currently use alcohol. He reports that he does not use drugs. Family History:  Family History  Problem Relation Age of Onset   Cancer Mother        multiple myeloma   Colon polyps Mother    Diabetes Father    Autoimmune disease Father        liver and renal disease   Colon polyps Father    Heart attack Brother    Colon polyps Brother    Diabetes Maternal Grandfather    Colon cancer Neg Hx    Prostate cancer Neg Hx    Rectal cancer Neg Hx    Stomach cancer Neg Hx      HOME MEDICATIONS: Allergies as of 11/04/2023       Reactions   Metformin And Related Other (See Comments)   abd pain with med use   Sulfa Antibiotics Other (See Comments)   Lightheaded, intolerant, "felt sick"        Medication List        Accurate as of November 04, 2023  7:59 AM. If you have any questions, ask your nurse or doctor.          aspirin EC 81 MG tablet Take 1 tablet (81 mg total) by mouth daily. Swallow whole.   atorvastatin 80 MG tablet Commonly known as: LIPITOR TAKE 1 TABLET BY MOUTH EVERY DAY   B-D UF III MINI PEN NEEDLES 31G X 5 MM Misc Generic drug: Insulin Pen Needle USE WITH INSULIN INJECTION   chlorhexidine 0.12 % solution Commonly known as: PERIDEX RINSE AND DISCARD WITH HALF CAPFUL 3 TIMES A DAY FOR TWO WEEKS.SPIT OUT--DO NOT SWALLOW   clopidogrel 75 MG tablet Commonly known as: PLAVIX TAKE 1 TABLET BY MOUTH EVERY DAY   ibuprofen 600 MG tablet Commonly known as: ADVIL Take 600 mg by mouth every  6 (six) hours as needed.   Lantus SoloStar 100 UNIT/ML Solostar Pen Generic drug: insulin glargine Inject 5-20 Units into the skin daily.   lisinopril 10 MG tablet Commonly known as: ZESTRIL TAKE 1 TABLET (10 MG TOTAL) BY MOUTH EVERY MORNING.   Metoprolol Tartrate 37.5 MG Tabs TAKE 1 TABLET BY MOUTH TWICE A DAY   multivitamin tablet Take 1 tablet by mouth daily.   nitroGLYCERIN 0.4 MG SL tablet Commonly known as: NITROSTAT Place 1 tablet (0.4 mg  total) under the tongue every 5 (five) minutes as needed for chest pain.   oxyCODONE 5 MG immediate release tablet Commonly known as: Oxy IR/ROXICODONE Take 5 mg by mouth every 4 (four) hours as needed.   prednisoLONE acetate 1 % ophthalmic suspension Commonly known as: PRED FORTE INSTILL 1 DROP IN THE LEFT EYE 4 TIMES A DAY FOR 2 WEEK THEN DISCONTINUE         OBJECTIVE:   Vital Signs: BP 130/80 (BP Location: Left Arm, Patient Position: Sitting, Cuff Size: Large)   Pulse 82   Ht 5\' 11"  (1.803 m)   Wt 217 lb (98.4 kg)   SpO2 97%   BMI 30.27 kg/m   Wt Readings from Last 3 Encounters:  11/04/23 217 lb (98.4 kg)  06/30/23 213 lb (96.6 kg)  05/25/23 215 lb (97.5 kg)     Exam: General: Pt appears well and is in NAD  Lungs: Clear with good BS bilat  Heart: RRR   Abdomen: Soft, nontender  Extremities: No pretibial edema.  Neuro: MS is good with appropriate affect, pt is alert and Ox3   DM foot exam: 11/04/2023   The skin of the feet is intact without sores or ulcerations. The pedal pulses are 1+ on right and 1+ on left. The sensation is intact  to a screening 5.07, 10 gram monofilament on the right     DATA REVIEWED:   Results for Ailey, DAONTE GRIBBEN" (MRN [id number]) as of 06/25/2021 10:08  Ref. Range 06/24/2021 09:01  Glucose, Plasma Latest Ref Range: 65 - 139 mg/dL 409   C-peptide 8.11 ng/mL     Lab Results  Component Value Date   HGBA1C 7.3 (A) 06/24/2023   HGBA1C 8.2 (H) 04/21/2023   HGBA1C 9.7 (H)  12/22/2022    Latest Reference Range & Units 05/13/23 08:27  Sodium 135 - 145 mEq/L 141  Potassium 3.5 - 5.1 mEq/L 4.5  Chloride 96 - 112 mEq/L 103  CO2 19 - 32 mEq/L 30  Glucose 70 - 99 mg/dL 914 (H)  BUN 6 - 23 mg/dL 13  Creatinine 7.82 - 9.56 mg/dL 2.13  Calcium 8.4 - 08.6 mg/dL 9.6  Alkaline Phosphatase 39 - 117 U/L 71  Albumin 3.5 - 5.2 g/dL 4.1  AST 0 - 37 U/L 20  ALT 0 - 53 U/L 20  Total Protein 6.0 - 8.3 g/dL 6.5  Total Bilirubin 0.2 - 1.2 mg/dL 1.0  GFR >57.84 mL/min 94.45    Latest Reference Range & Units 05/13/23 08:27  Total CHOL/HDL Ratio  2  Cholesterol 0 - 200 mg/dL 88  HDL Cholesterol >69.62 mg/dL 95.28 (L)  LDL (calc) 0 - 99 mg/dL 32  MICROALB/CREAT RATIO 0.0 - 30.0 mg/g 1.1  NonHDL  51.46  Triglycerides 0.0 - 149.0 mg/dL 41.3  VLDL 0.0 - 24.4 mg/dL 01.0  (L): Data is abnormally low   ASSESSMENT / PLAN / RECOMMENDATIONS:   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 Goal A1c < 7.0 %.   - He will not be a good candidate for GLP-1 agonists due to pancreatic insufficiency nor for SGLT-2 inhibitors due to increased risk of DKA in the setting of autoimmune diabetes mellitus. -He would like to avoid metformin due GI side effects  -He has not been consistent with insulin intake, I have recommended switching to glimepiride, discussed the importance of taking it before the first meal of the day  to prevent hypoglycemia   MEDICATIONS: Stop Lantus Start glimepiride 2 mg daily  EDUCATION / INSTRUCTIONS: BG monitoring instructions: Patient is instructed to check his blood sugars 3 times a day, before meals . Call [facility] if: BG persistently < 70  I reviewed the Rule of 15 for the treatment of hypoglycemia in detail with the patient. Literature supplied.     2) Diabetic complications:  Eye: Does not have known  diabetic retinopathy.  Neuro/ Feet: Does  have known diabetic peripheral neuropathy .  Renal: Patient does not have known baseline CKD.    3) Microalbuminuria:   - Slight elevation at 44 mg / gram in 2021, he was started on lisinopril, he is tolerating this well. -This has resolved in 2022 -Up-to-date on MA/CR ratio   Medication  Continue lisinopril 10 mg daily       F/U in 6 months     Signed electronically by: Lyndle Herrlich, MD  [facility]  [facility] [address] [place] [place], [place] [address] Phone: [phone] FAX: [phone]   CC: Joaquim Nam, MD [address] [place] [address] Phone: [phone]  Fax: [phone]  Return to Endocrinology clinic as below: Future Appointments  Date Time Provider Department Center  12/29/2023  9:00 AM Tessa Lerner, DO [facility]  01/18/2024 10:00 AM Zehr, Princella Pellegrini, PA-C [facility]  02/03/2024 12:30 PM Sherrie George, MD [facility] None

## 2023-11-04 NOTE — Patient Instructions (Signed)
Stop Lantus Start glimepiride 2 mg, 1 tablet before the first meal of the day     HOW TO TREAT LOW BLOOD SUGARS (Blood sugar LESS THAN 70 MG/DL) Please follow the RULE OF 15 for the treatment of hypoglycemia treatment (when your (blood sugars are less than 70 mg/dL)   STEP 1: Take 15 grams of carbohydrates when your blood sugar is low, which includes:  3-4 GLUCOSE TABS  OR 3-4 OZ OF JUICE OR REGULAR SODA OR ONE TUBE OF GLUCOSE GEL    STEP 2: RECHECK blood sugar in 15 MINUTES STEP 3: If your blood sugar is still low at the 15 minute recheck --> then, go back to STEP 1 and treat AGAIN with another 15 grams of carbohydrates.

## 2023-11-12 ENCOUNTER — Other Ambulatory Visit: Payer: Self-pay | Admitting: Cardiology

## 2023-12-01 ENCOUNTER — Other Ambulatory Visit: Payer: Self-pay | Admitting: Family Medicine

## 2023-12-29 ENCOUNTER — Ambulatory Visit: Payer: Medicare Other | Attending: Cardiology | Admitting: Cardiology

## 2023-12-29 ENCOUNTER — Encounter: Payer: Self-pay | Admitting: Cardiology

## 2023-12-29 VITALS — BP 134/72 | HR 74 | Resp 16 | Ht 71.0 in | Wt 224.0 lb

## 2023-12-29 DIAGNOSIS — Z951 Presence of aortocoronary bypass graft: Secondary | ICD-10-CM | POA: Diagnosis not present

## 2023-12-29 DIAGNOSIS — Z87891 Personal history of nicotine dependence: Secondary | ICD-10-CM

## 2023-12-29 DIAGNOSIS — E139 Other specified diabetes mellitus without complications: Secondary | ICD-10-CM

## 2023-12-29 DIAGNOSIS — I2510 Atherosclerotic heart disease of native coronary artery without angina pectoris: Secondary | ICD-10-CM

## 2023-12-29 DIAGNOSIS — I700 Atherosclerosis of aorta: Secondary | ICD-10-CM | POA: Diagnosis not present

## 2023-12-29 DIAGNOSIS — I10 Essential (primary) hypertension: Secondary | ICD-10-CM

## 2023-12-29 NOTE — Progress Notes (Signed)
 Cardiology Office Note:  .   Date:  12/29/2023  ID:  Stephen Shelton, DOB January 31, 1956, MRN [id number] PCP:  Stephen Arlyss RAMAN, MD  Former Cardiology Providers: None Cardiothoracic surgeon: Dr. Kerrin [facility] Providers Cardiologist:  Stephen Large, DO , [facility] (established care 11/24/2021) Electrophysiologist:  None  Click to update primary MD,subspecialty MD or APP then REFRESH:1}    Chief Complaint  Patient presents with   Coronary artery disease involving native coronary artery of   Follow-up    History of Present Illness: .   [name] is a 68 y.o. Caucasian male whose past medical history and cardiovascular risk factors includes: Established CAD, status post four-vessel bypass, latent Autoimmune Diabetes in Adults, With neuropathic  and retinopathic complications with microalbuminuria, HTN, HLD, former smoker.   Established care and January 2023 when he presented to the ED for chest pain. His symptoms were very concerning for angina pectoris. Underwent left heart catheterization and was noted to have obstructive CAD. He underwent four-vessel CABG with Dr. Kerrin on November 25, 2021   Patient presents today for 61-month follow-up visit.  At last office visit patient was here for preoperative restratification for extensive oral/facial surgery involving removal of teeth, reduction and smoothening of the jaw, and placement of dental implants.  Since last office visit, he denies any anginal chest pain or heart failure symptoms.  Overall function capacity remains stable.  He is very pleased with the outcome of his dental implants in the upper and lower jawline.   Review of Systems: .   Review of Systems  Cardiovascular:  Negative for chest pain, claudication, irregular heartbeat, leg swelling, near-syncope, orthopnea, palpitations, paroxysmal nocturnal dyspnea and syncope.  Respiratory:  Negative for shortness of breath.   Hematologic/Lymphatic: Negative for bleeding  problem.    Studies Reviewed:   EKG: June 30, 2023: Sinus rhythm, 60 bpm, without underlying ischemia injury pattern.  Echocardiogram: 11/24/2021:  1. Left ventricular ejection fraction, by estimation, is 50 to 55%. The  left ventricle has low normal function. The left ventricle has no regional wall motion abnormalities. There is mild left ventricular hypertrophy.  Left ventricular diastolic parameters are consistent with Grade II diastolic dysfunction (pseudonormalization). Elevated left atrial pressure.   2. Right ventricular systolic function is normal. The right ventricular size is normal.   3. Left atrial size was mildly dilated.   4. The mitral valve is degenerative. Mild mitral valve regurgitation. No evidence of mitral stenosis.   5. The aortic valve is tricuspid. Aortic valve regurgitation is not visualized. Aortic valve sclerosis/calcification is present, without any evidence of aortic stenosis.   Heart catheterization: November 24, 2021: LM: Normal LAD: Complex mid LAD/Diag2/septal 90% trifurcation lesion ([place] 1,1,1) Lcx: Prox OM2 80% stenosis RCA: Prox CTO. Epicardial right-to-right and left-to-right collaterals, mostly from LAD septals   LVEDP normal LVEF by LV gram 50-55%.    Continue Aspirin , heparin , statin, blood pressure control CVTS consulted for CABG   Carotid duplex: 11/24/2021: Right Carotid: Velocities in the right ICA are consistent with a 1-39% stenosis.  Left Carotid: Velocities in the left ICA are consistent with a 1-39% stenosis.  Vertebrals:  Bilateral vertebral arteries demonstrate antegrade flow.  Subclavians: Left subclavian artery was stenotic. Normal flow hemodynamics were seen in the right subclavian artery.   RADIOLOGY: NA  Risk Assessment/Calculations:   N/A   Labs:       Latest Ref Rng & Units 05/23/2023    8:17 AM 04/21/2023    9:34  AM 12/22/2022    7:35 AM  CBC  WBC 4.0 - 10.5 K/uL 8.9  10.4  8.4   Hemoglobin 13.0 - 17.0 g/dL 85.6   84.1  85.2   Hematocrit 39.0 - 52.0 % 44.0  47.7  42.9   Platelets 150 - 400 K/uL 192  215  211.0        Latest Ref Rng & Units 05/13/2023    8:27 AM 04/21/2023    9:34 AM 12/22/2022    7:35 AM  BMP  Glucose 70 - 99 mg/dL 855  839  774   BUN 6 - 23 mg/dL 13  13  13    Creatinine 0.40 - 1.50 mg/dL 9.26  9.33  9.26   Sodium 135 - 145 mEq/L 141  139  136   Potassium 3.5 - 5.1 mEq/L 4.5  4.8  4.5   Chloride 96 - 112 mEq/L 103  107  101   CO2 19 - 32 mEq/L 30  26  28    Calcium  8.4 - 10.5 mg/dL 9.6  9.1  9.0       Latest Ref Rng & Units 05/13/2023    8:27 AM 04/21/2023    9:34 AM 12/22/2022    7:35 AM  CMP  Glucose 70 - 99 mg/dL 855  839  774   BUN 6 - 23 mg/dL 13  13  13    Creatinine 0.40 - 1.50 mg/dL 9.26  9.33  9.26   Sodium 135 - 145 mEq/L 141  139  136   Potassium 3.5 - 5.1 mEq/L 4.5  4.8  4.5   Chloride 96 - 112 mEq/L 103  107  101   CO2 19 - 32 mEq/L 30  26  28    Calcium  8.4 - 10.5 mg/dL 9.6  9.1  9.0   Total Protein 6.0 - 8.3 g/dL 6.5   6.1   Total Bilirubin 0.2 - 1.2 mg/dL 1.0   0.6   Alkaline Phos 39 - 117 U/L 71   79   AST 0 - 37 U/L 20   13   ALT 0 - 53 U/L 20   18     Lab Results  Component Value Date   CHOL 88 05/13/2023   HDL 36.50 (L) 05/13/2023   LDLCALC 32 05/13/2023   LDLDIRECT 36 03/09/2022   TRIG 95.0 05/13/2023   CHOLHDL 2 05/13/2023   No results for input(s): LIPOA in the last 8760 hours. No components found for: NTPROBNP No results for input(s): PROBNP in the last 8760 hours. No results for input(s): TSH in the last 8760 hours.  Physical Exam:    Today's Vitals   12/29/23 0926  BP: 134/72  Pulse: 74  Resp: 16  SpO2: 98%  Weight: 224 lb (101.6 kg)  Height: 5' 11 (1.803 m)   Body mass index is 31.24 kg/m. Wt Readings from Last 3 Encounters:  12/29/23 224 lb (101.6 kg)  11/04/23 217 lb (98.4 kg)  06/30/23 213 lb (96.6 kg)    Physical Exam  Constitutional: No distress.  Age appropriate, hemodynamically stable.   Neck: No JVD  present.  Cardiovascular: Normal rate, regular rhythm, S1 normal, S2 normal, intact distal pulses and normal pulses. Exam reveals no gallop, no S3 and no S4.  No murmur heard. Pulmonary/Chest: Effort normal and breath sounds normal. No stridor. He has no wheezes. He has no rales.  Sternotomy site well-healed.  Clean dry and intact.  Abdominal: Soft. Bowel sounds are normal. He exhibits no  distension. There is no abdominal tenderness.  Musculoskeletal:        General: No edema.     Cervical back: Neck supple.  Neurological: He is alert and oriented to person, place, and time. He has intact cranial nerves (2-12).  Skin: Skin is warm and moist.     Impression & Recommendation(s):  Impression:   ICD-10-CM   1. Coronary artery disease involving native coronary artery of native heart without angina pectoris  I25.10     2. Hx of CABG  Z95.1     3. Aortic atherosclerosis (HCC)  I70.0     4. 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) (HCC)  E13.9     5. Benign hypertension  I10     6. Former smoker  Z87.891        Recommendation(s):  Coronary artery disease involving native coronary artery of native heart without angina pectoris Hx of CABG Aortic atherosclerosis (HCC) Present[facility] January 2023 for NSTEMI. Underwent angiography and was noted to have multivessel CAD status post four-vessel bypass in January 2023 (LIMA to the LAD, SVG to diagonal branch, SVG to OM, SVG to PDA) Denies anginal chest pain or heart failure symptoms. Currently on dual antiplatelet therapy.  Will discontinue aspirin  81 mg p.o. daily. Continue Plavix  75 mg p.o. daily. Continue Lipitor  80 mg p.o. daily. Continue lisinopril  10 mg p.o. daily. Continue Toprol -XL 37.15 mg p.o. twice daily  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) (HCC) Reemphasized importance of glycemic control. Will have fasting lipids and A1c rechecked in June 2025 with PCP  Benign  hypertension Office blood pressures are well-controlled. Continue lisinopril  10 mg p.o. daily Reemphasized importance of low-salt diet.  Orders Placed:  No orders of the defined types were placed in this encounter.  Final Medication List:   No orders of the defined types were placed in this encounter.   Medications Discontinued During This Encounter  Medication Reason   chlorhexidine  (PERIDEX ) 0.12 % solution Patient Preference   ibuprofen (ADVIL) 600 MG tablet Patient Preference   prednisoLONE acetate (PRED FORTE) 1 % ophthalmic suspension Patient Preference   aspirin  81 MG EC tablet Discontinued by provider     Current Outpatient Medications:    atorvastatin  (LIPITOR ) 80 MG tablet, TAKE 1 TABLET BY MOUTH EVERY DAY, Disp: 90 tablet, Rfl: 2   clopidogrel  (PLAVIX ) 75 MG tablet, TAKE 1 TABLET BY MOUTH EVERY DAY, Disp: 90 tablet, Rfl: 3   glimepiride  (AMARYL ) 2 MG tablet, Take 1 tablet (2 mg total) by mouth daily before breakfast., Disp: 90 tablet, Rfl: 3   lisinopril  (ZESTRIL ) 10 MG tablet, TAKE 1 TABLET (10 MG TOTAL) BY MOUTH EVERY MORNING., Disp: 90 tablet, Rfl: 3   Metoprolol  Tartrate 37.5 MG TABS, TAKE 1 TABLET BY MOUTH TWICE A DAY, Disp: 180 tablet, Rfl: 3   Multiple Vitamin (MULTIVITAMIN) tablet, Take 1 tablet by mouth daily., Disp: , Rfl:    nitroGLYCERIN  (NITROSTAT ) 0.4 MG SL tablet, Place 1 tablet (0.4 mg total) under the tongue every 5 (five) minutes as needed for chest pain., Disp: 50 tablet, Rfl: 3   oxyCODONE  (OXY IR/ROXICODONE ) 5 MG immediate release tablet, Take 5 mg by mouth every 4 (four) hours as needed., Disp: , Rfl:   Consent:   NA  Disposition:   1 year follow up.  Patient may be asked to follow-up sooner based on the results of the above-mentioned testing.  His questions and concerns were addressed to his satisfaction. He voices  understanding of the recommendations provided during this encounter.    Signed, Stephen Large, DO, [facility]  [facility]  [address] [place], [place] [address] 12/29/2023 9:57 AM

## 2023-12-29 NOTE — Patient Instructions (Addendum)
 Medication Instructions:  Your physician has recommended you make the following change in your medication:   STOP aspirin     *If you need a refill on your cardiac medications before your next appointment, please call your pharmacy*  Lab Work: None ordered today. If you have labs (blood work) drawn today and your tests are completely normal, you will receive your results only by: MyChart Message (if you have MyChart) OR A paper copy in the mail If you have any lab test that is abnormal or we need to change your treatment, we will call you to review the results.  Testing/Procedures: None ordered today.  Follow-Up: At [facility], you and your health needs are our priority.  As part of our continuing mission to provide you with exceptional heart care, we have created designated Provider Care Teams.  These Care Teams include your primary Cardiologist (physician) and Advanced Practice Providers (APPs -  Physician Assistants and Nurse Practitioners) who all work together to provide you with the care you need, when you need it.  We recommend signing up for the patient portal called MyChart.  Sign up information is provided on this After Visit Summary.  MyChart is used to connect with patients for Virtual Visits (Telemedicine).  Patients are able to view lab/test results, encounter notes, upcoming appointments, etc.  Non-urgent messages can be sent to your provider as well.   To learn more about what you can do with MyChart, go to [email or web address].    Your next appointment:   1 year(s)  The format for your next appointment:   In Person  Provider:   Madonna Large, DO {

## 2024-01-02 ENCOUNTER — Ambulatory Visit: Payer: Self-pay | Admitting: Cardiology

## 2024-01-18 ENCOUNTER — Ambulatory Visit: Payer: Medicare Other | Admitting: Gastroenterology

## 2024-02-03 ENCOUNTER — Other Ambulatory Visit: Payer: Self-pay | Admitting: Family Medicine

## 2024-02-03 ENCOUNTER — Encounter (INDEPENDENT_AMBULATORY_CARE_PROVIDER_SITE_OTHER): Payer: PPO | Admitting: Ophthalmology

## 2024-02-03 DIAGNOSIS — D3132 Benign neoplasm of left choroid: Secondary | ICD-10-CM

## 2024-02-03 DIAGNOSIS — H43813 Vitreous degeneration, bilateral: Secondary | ICD-10-CM

## 2024-02-03 DIAGNOSIS — E113592 Type 2 diabetes mellitus with proliferative diabetic retinopathy without macular edema, left eye: Secondary | ICD-10-CM | POA: Diagnosis not present

## 2024-02-03 DIAGNOSIS — I10 Essential (primary) hypertension: Secondary | ICD-10-CM

## 2024-02-03 DIAGNOSIS — H35033 Hypertensive retinopathy, bilateral: Secondary | ICD-10-CM

## 2024-02-03 DIAGNOSIS — E113391 Type 2 diabetes mellitus with moderate nonproliferative diabetic retinopathy without macular edema, right eye: Secondary | ICD-10-CM

## 2024-02-03 DIAGNOSIS — Z7984 Long term (current) use of oral hypoglycemic drugs: Secondary | ICD-10-CM

## 2024-02-03 LAB — HM DIABETES EYE EXAM

## 2024-02-24 ENCOUNTER — Telehealth: Payer: Self-pay

## 2024-02-24 ENCOUNTER — Encounter: Payer: Self-pay | Admitting: Gastroenterology

## 2024-02-24 ENCOUNTER — Ambulatory Visit: Payer: Medicare Other | Admitting: Gastroenterology

## 2024-02-24 VITALS — BP 136/60 | HR 76 | Ht 69.0 in | Wt 225.1 lb

## 2024-02-24 DIAGNOSIS — Z860100 Personal history of colon polyps, unspecified: Secondary | ICD-10-CM

## 2024-02-24 DIAGNOSIS — K6282 Dysplasia of anus: Secondary | ICD-10-CM | POA: Insufficient documentation

## 2024-02-24 DIAGNOSIS — Z7902 Long term (current) use of antithrombotics/antiplatelets: Secondary | ICD-10-CM

## 2024-02-24 DIAGNOSIS — Z09 Encounter for follow-up examination after completed treatment for conditions other than malignant neoplasm: Secondary | ICD-10-CM | POA: Diagnosis not present

## 2024-02-24 MED ORDER — NA SULFATE-K SULFATE-MG SULF 17.5-3.13-1.6 GM/177ML PO SOLN: 1.0000 | Freq: Once | ORAL | 0 refills | Status: AC

## 2024-02-24 NOTE — Telephone Encounter (Signed)
   Name: Stephen Shelton  DOB: [birth year]  MRN: [id number]   Primary Cardiologist: Tessa Lerner, DO  Chart reviewed as part of pre-operative protocol coverage.   Per office protocol, he may hold Plavix for 5 days prior to procedure and should resume as soon as hemodynamically stable postoperatively. Patient should take aspirin 81 mg daily throughout peri-procedural period. He may stop aspirin when he resumes Plavix.   I will route this recommendation to the requesting party via Epic fax function and remove from pre-op pool. Please call with questions.  Carlos Levering, NP 02/24/2024, 10:41 AM

## 2024-02-24 NOTE — Patient Instructions (Signed)
 You have been scheduled for a colonoscopy. Please follow written instructions given to you at your visit today.   If you use inhalers (even only as needed), please bring them with you on the day of your procedure.  DO NOT TAKE 7 DAYS PRIOR TO TEST- Trulicity (dulaglutide) Ozempic, Wegovy (semaglutide) Mounjaro (tirzepatide) Bydureon Bcise (exanatide extended release)  DO NOT TAKE 1 DAY PRIOR TO YOUR TEST Rybelsus (semaglutide) Adlyxin (lixisenatide) Victoza (liraglutide) Byetta (exanatide) ___________________________________________________________________________  Bonita Quin will be contacted by our office prior to your procedure for directions on holding your Plavix.  If you do not hear from our office 1 week prior to your scheduled procedure, please call [phone] to discuss.   Due to recent changes in healthcare laws, you may see the results of your imaging and laboratory studies on MyChart before your provider has had a chance to review them.  We understand that in some cases there may be results that are confusing or concerning to you. Not all laboratory results come back in the same time frame and the provider may be waiting for multiple results in order to interpret others.  Please give [place] 48 hours in order for your provider to thoroughly review all the results before contacting the office for clarification of your results.   _______________________________________________________  If your blood pressure at your visit was 140/90 or greater, please contact your primary care physician to follow up on this.  _______________________________________________________  If you are age 91 or older, your body mass index should be between 23-30. Your Body mass index is 33.25 kg/m. If this is out of the aforementioned range listed, please consider follow up with your Primary Care Provider.  If you are age 4 or younger, your body mass index should be between 19-25. Your Body mass index is 33.25  kg/m. If this is out of the aformentioned range listed, please consider follow up with your Primary Care Provider.   ________________________________________________________  The [facility] providers would like to encourage you to use [facility] to communicate with providers for non-urgent requests or questions.  Due to long hold times on the telephone, sending your provider a message by [facility] may be a faster and more efficient way to get a response.  Please allow 48 business hours for a response.  Please remember that this is for non-urgent requests.  _______________________________________________________  Thank you for entrusting me with your care and for choosing [employer], Doug Sou, P.A. - C.

## 2024-02-24 NOTE — Progress Notes (Signed)
 02/24/2024 Stephen Shelton [id number] 02/21/1956   HISTORY OF PRESENT ILLNESS: This is a 68 year old male who is a patient of Dr. Elana Alm.  He has history of coronary artery disease and is on Plavix.  Follows with Dr. Odis Hollingshead and just saw him a couple of months ago, all is stable.  He follows here for history of colon polyps.  Last colonoscopy March 2024 as below.  Since then he saw Dr. 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  Colonoscopy 01/2023:  - Nodular mucosa in the cecum and at the appendiceal orifice. Biopsied. - Two 4 to 7 mm polyps in the sigmoid colon and in the transverse colon, removed with a cold snare. Resected and retrieved. - Likely benign polypoid lesion in the distal rectum. Biopsied.- Diverticulosis in the sigmoid colon, in the descending colon, in the transverse colon and in the ascending colon. - Non- bleeding external and internal hemorrhoids. - The GI Genius ( intelligent endoscopy module) , computer- aided polyp detection system powered by AI was utilized to detect colorectal polyps through enhanced visualization during colonoscopy.  1. Surgical [P], colon, cecum - TUBULAR ADENOMA(S). - NO HIGH GRADE DYSPLASIA OR MALIGNANCY. 2. Surgical [P], colon, transverse, polyp (1) - TUBULAR ADENOMA. - NO HIGH GRADE DYSPLASIA OR MALIGNANCY. 3. Surgical [P], colon, sigmoid, polyp (1) - HYPERPLASTIC POLYP. - NO DYSPLASIA OR MALIGNANCY. 4. Surgical [P], colon, anal canal biopsies - LOW-GRADE SQUAMOUS INTRAEPITHELIAL LESION (AIN 1)  Appendectomy 04/2023:  A. APPENDIX WITH CUFF OF CECUM, APPENDECTOMY:  -  Colonic mucosa with lymphoid aggregates.  -  Appendix with extensive serosal adhesions.   Note: It is noted that the patient has a history of a cecal tubular  adenoma ([id number]).  There is no residual adenomatous epithelium  present in the specimen.  The specimen is submitted in its entirety.   Transanal  excision of anorectal lesion 05/2023:  A. ANAL CANAL LESION, ANTERIOR MIDLINE, EXCISION:  -Squamous and columnar junctional mucosa with focal koilocytic atypia,  suggestive of low-grade squamous intraepithelial lesion (AIN1, low-grade  dysplasia)   He feels well.  No GI complaints.  Moving his bowels well.  No rectal bleeding.  Past Medical History:  Diagnosis Date   Coronary artery disease    Diabetes mellitus (HCC)    diet controlled   Hyperlipidemia    Hypertension    Myocardial infarction [facility])    11-25-2021   CABG   Pneumonia    Septic arthritis (HCC)    R shoulder, history of   Smoker    Past Surgical History:  Procedure Laterality Date   APPENDECTOMY     COLON SURGERY     CORONARY ARTERY BYPASS GRAFT Right 11/25/2021   Procedure: CORONARY ARTERY BYPASS GRAFTING (CABG) X 4  ON PUMP USING LEFT INTERNAL MAMMARY ARTERY AND RIGHT ENDOSCOPIC GREATER SAPHENOUS VEIN CONDUITS;  Surgeon: Loreli Slot, MD;  Location: [facility] OR;  Service: Open Heart Surgery;  Laterality: Right;  Right radial artery harvest   ENDOVEIN HARVEST OF GREATER SAPHENOUS VEIN Right 11/25/2021   Procedure: ENDOVEIN HARVEST OF GREATER SAPHENOUS VEIN;  Surgeon: Loreli Slot, MD;  Location: [facility] OR;  Service: Open Heart Surgery;  Laterality: Right;   LAPAROSCOPIC APPENDECTOMY N/A 04/25/2023   Procedure: LAPAROSCOPIC APPENDECTOMY WITH LYSIS OF ADHESIONS;  Surgeon: Andria Meuse, MD;  Location: [facility] ORS;  Service: General;  Laterality: N/A;   LEFT HEART CATH AND CORONARY ANGIOGRAPHY N/A  11/24/2021   Procedure: LEFT HEART CATH AND CORONARY ANGIOGRAPHY;  Surgeon: Elder Negus, MD;  Location: [facility] INVASIVE CV LAB;  Service: Cardiovascular;  Laterality: N/A;   PILONIDAL CYST EXCISION  1974   RECTAL EXAM UNDER ANESTHESIA N/A 05/25/2023   Procedure: ANORECTAL EXAM UNDER ANESTHESIA;  Surgeon: Andria Meuse, MD;  Location: [facility] ORS;  Service: General;  Laterality: N/A;   septic arthritis shoulder  Right    TRANSANAL EXCISION OF RECTAL MASS N/A 05/25/2023   Procedure: TRANSANAL EXCISION OF ANORECTAL LESION;  Surgeon: Andria Meuse, MD;  Location: [facility] ORS;  Service: General;  Laterality: N/A;    reports that he quit smoking about 2 years ago. His smoking use included cigarettes. He started smoking about 35 years ago. He has a 16.5 pack-year smoking history. He has never used smokeless tobacco. He reports that he does not currently use alcohol. He reports that he does not use drugs. family history includes Autoimmune disease in his father; Cancer in his mother; Colon polyps in his brother, father, and mother; Diabetes in his father and maternal grandfather; Heart attack in his brother. Allergies  Allergen Reactions   Metformin And Related Other (See Comments)    abd pain with med use   Sulfa Antibiotics Other (See Comments)    Lightheaded, intolerant, "felt sick"      Outpatient Encounter Medications as of 02/24/2024  Medication Sig   atorvastatin (LIPITOR) 80 MG tablet TAKE 1 TABLET BY MOUTH EVERY DAY   clopidogrel (PLAVIX) 75 MG tablet TAKE 1 TABLET BY MOUTH EVERY DAY   glimepiride (AMARYL) 2 MG tablet Take 1 tablet (2 mg total) by mouth daily before breakfast.   lisinopril (ZESTRIL) 10 MG tablet TAKE 1 TABLET (10 MG TOTAL) BY MOUTH EVERY MORNING.   Metoprolol Tartrate 37.5 MG TABS TAKE 1 TABLET BY MOUTH TWICE A DAY   Multiple Vitamin (MULTIVITAMIN) tablet Take 1 tablet by mouth daily.   oxyCODONE (OXY IR/ROXICODONE) 5 MG immediate release tablet Take 5 mg by mouth every 4 (four) hours as needed.   nitroGLYCERIN (NITROSTAT) 0.4 MG SL tablet Place 1 tablet (0.4 mg total) under the tongue every 5 (five) minutes as needed for chest pain. (Patient not taking: Reported on 02/24/2024)   No facility-administered encounter medications on file as of 02/24/2024.     REVIEW OF SYSTEMS  : All other systems reviewed and negative except where noted in the History of Present Illness.   PHYSICAL  EXAM: BP 136/60 (BP Location: Left Arm, Patient Position: Sitting, Cuff Size: Large)   Pulse 76   Ht 5\' 9"  (1.753 m)   Wt 225 lb 2 oz (102.1 kg)   BMI 33.25 kg/m  General: Well developed white male in no acute distress Head: Normocephalic and atraumatic Eyes:  Sclerae anicteric, conjunctiva pink. Ears: Normal auditory acuity Lungs: Clear throughout to auscultation; no W/R/R. Heart: Regular rate and rhythm; no M/R/G. Rectal:  Will be done at the time of colonoscopy. Musculoskeletal: Symmetrical with no gross deformities  Skin: No lesions on visible extremities Extremities: No edema  Neurological: Alert oriented x 4, grossly non-focal Psychological:  Alert and cooperative. 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  Since then had an appendectomy and surgery for transanal excision of anorectal lesion.  Repeat colonoscopy recommended in a year.  Will schedule with Dr. Lavon Paganini. *Chronic antiplatelet use with Plavix for history of CAD:  Hold Plavix for  5 days before procedure - will instruct when and how to resume after procedure. Risks and benefits of procedure including bleeding, perforation, infection, missed lesions, medication reactions and possible hospitalization or surgery if complications occur explained. Additional rare but real risk of cardiovascular event such as heart attack or ischemia/infarct of other organs off of Plavix explained and need to seek urgent help if this occurs. Will communicate by phone or EMR with patient's prescribing provider, Dr. Odis Hollingshead, that to confirm holding Plavix is reasonable in this case.     CC:  Joaquim Nam, MD

## 2024-02-24 NOTE — Telephone Encounter (Signed)
 [facility] Pre-operative Risk Assessment     Request for surgical clearance:     Endoscopy Procedure  What type of surgery is being performed?     Colonoscopy  When is this surgery scheduled?     04/11/24  What type of clearance is required ?   Pharmacy  Are there any medications that need to be held prior to surgery and how long? Plavix x5 days  Practice name and name of physician performing surgery?      [facility]  What is your office phone and fax number?      Phone- [phone]  Fax- [phone]  Anesthesia type (None, local, MAC, general) ?       MAC   Please route your response to Charlies Silvers, CMA

## 2024-03-13 ENCOUNTER — Other Ambulatory Visit: Payer: Self-pay | Admitting: Cardiology

## 2024-03-15 ENCOUNTER — Other Ambulatory Visit: Payer: Self-pay

## 2024-03-15 ENCOUNTER — Ambulatory Visit (INDEPENDENT_AMBULATORY_CARE_PROVIDER_SITE_OTHER)

## 2024-03-15 ENCOUNTER — Ambulatory Visit
Admission: RE | Admit: 2024-03-15 | Discharge: 2024-03-15 | Disposition: A | Discharge status: Home / Self Care | Source: Ambulatory Visit | Attending: Nurse Practitioner | Admitting: Nurse Practitioner

## 2024-03-15 VITALS — BP 140/77 | HR 83 | Temp 98.4°F | Resp 20

## 2024-03-15 DIAGNOSIS — R062 Wheezing: Secondary | ICD-10-CM | POA: Diagnosis not present

## 2024-03-15 DIAGNOSIS — R059 Cough, unspecified: Secondary | ICD-10-CM | POA: Diagnosis not present

## 2024-03-15 DIAGNOSIS — J22 Unspecified acute lower respiratory infection: Secondary | ICD-10-CM

## 2024-03-15 DIAGNOSIS — J40 Bronchitis, not specified as acute or chronic: Secondary | ICD-10-CM | POA: Diagnosis not present

## 2024-03-15 DIAGNOSIS — J90 Pleural effusion, not elsewhere classified: Secondary | ICD-10-CM | POA: Diagnosis not present

## 2024-03-15 MED ORDER — AMOXICILLIN-POT CLAVULANATE 875-125 MG PO TABS: 1.0000 | ORAL_TABLET | Freq: Two times a day (BID) | ORAL | 0 refills | Status: DC

## 2024-03-15 MED ORDER — PROMETHAZINE-DM 6.25-15 MG/5ML PO SYRP: 5.0000 mL | ORAL_SOLUTION | Freq: Four times a day (QID) | ORAL | 0 refills | Status: DC | PRN

## 2024-03-15 MED ORDER — ALBUTEROL SULFATE HFA 108 (90 BASE) MCG/ACT IN AERS: 2.0000 | INHALATION_SPRAY | Freq: Four times a day (QID) | RESPIRATORY_TRACT | 0 refills | Status: AC | PRN

## 2024-03-15 MED ORDER — PREDNISONE 20 MG PO TABS: 20.0000 mg | ORAL_TABLET | Freq: Every day | ORAL | 0 refills | Status: AC

## 2024-03-15 NOTE — ED Triage Notes (Signed)
 Pt reports cough, sinus pressure, headache, fever x1 week.

## 2024-03-15 NOTE — Discharge Instructions (Addendum)
 Chest x-ray is pending.  You will be contacted when the results of the chest x-ray are received.  You also have access to the results via MyChart. Take medication as prescribed. Monitor your blood glucose levels while you are taking the prednisone .  The prednisone  may cause your blood sugars to become elevated.  If your blood glucose levels exceed 400, please stop the medication immediately. Increase fluids and allow for plenty of rest. Recommend the use of Tylenol  as needed for pain, fever, or general discomfort. It may be helpful for you to use a humidifier in your bedroom at nighttime during sleep and to sleep elevated on pillows while symptoms persist. As discussed, your cough may last from days to weeks.  If you are generally feeling well and have a persistent, nagging cough, continue over-the-counter cough and cold medications, fluids, and cough drops.  Seek care if you develop new symptoms of fever, chills, wheezing, difficulty breathing, or other concerns. Follow-up as needed. If symptoms fail to improve with this treatment, please follow-up with your primary care physician.

## 2024-03-15 NOTE — ED Provider Notes (Signed)
 [facility]    CSN: [id number] Arrival date & time: 03/15/24  1759      History   Chief Complaint Chief Complaint  Patient presents with   Cough    Sinus drainage, headache,  fevet - Entered by patient    HPI Stephen Shelton is a 67 y.o. male.   The history is provided by the patient.   Patient presents with an almost 2-week history of headache, chest congestion, postnasal drainage, and cough.  Patient states that he has had fever, Tmax around 100.  He denies ear pain, ear drainage, nasal congestion, runny nose, difficulty breathing, chest pain, abdominal pain, nausea, vomiting, diarrhea, or rash.  Patient states that he has been wheezing intermittently.  He states that he is a former smoker.  Patient states he has been taking over-the-counter cough and cold medications with minimal relief.  Patient denies any obvious known sick contacts.  Patient reports prior history of pneumonia.  Past Medical History:  Diagnosis Date   Coronary artery disease    Diabetes mellitus (HCC)    diet controlled   Hyperlipidemia    Hypertension    Myocardial infarction [facility])    11-25-2021   CABG   Pneumonia    Septic arthritis (HCC)    R shoulder, history of   Smoker     Patient Active Problem List   Diagnosis Date Noted   History of colonic polyps 02/24/2024   AIN grade I 02/24/2024   Medicare annual wellness visit, subsequent 05/22/2023   Positive colorectal cancer screening using Cologuard test 01/07/2023   Chronic diarrhea 01/07/2023   Antiplatelet or antithrombotic long-term use 01/07/2023   Low fecal elastase level 01/07/2023   Exocrine pancreatic insufficiency 01/07/2023   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) 02/22/2022   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   CAD (coronary artery disease) 11/25/2021   NSTEMI (non-ST elevated myocardial infarction) (HCC) 11/24/2021   HTN (hypertension)    HLD  (hyperlipidemia)    UTI (urinary tract infection) 06/14/2021   Microalbuminuria 12/29/2020   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   Aortic atherosclerosis (HCC) 08/14/2020   Pancreatic insufficiency 06/29/2020   Loose stools 06/11/2020   Health care maintenance 05/13/2019   Groin pain 09/12/2018   Advance care planning 03/20/2015   Colon cancer screening 09/09/2012   AK (actinic keratosis) 06/02/2012   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   Low HDL (under 40) 02/08/2007   SMOKER 02/08/2007    Past Surgical History:  Procedure Laterality Date   APPENDECTOMY     COLON SURGERY     CORONARY ARTERY BYPASS GRAFT Right 11/25/2021   Procedure: CORONARY ARTERY BYPASS GRAFTING (CABG) X 4  ON PUMP USING LEFT INTERNAL MAMMARY ARTERY AND RIGHT ENDOSCOPIC GREATER SAPHENOUS VEIN CONDUITS;  Surgeon: Zelphia Higashi, MD;  Location: [facility] OR;  Service: Open Heart Surgery;  Laterality: Right;  Right radial artery harvest   ENDOVEIN HARVEST OF GREATER SAPHENOUS VEIN Right 11/25/2021   Procedure: ENDOVEIN HARVEST OF GREATER SAPHENOUS VEIN;  Surgeon: Zelphia Higashi, MD;  Location: [facility] OR;  Service: Open Heart Surgery;  Laterality: Right;   LAPAROSCOPIC APPENDECTOMY N/A 04/25/2023   Procedure: LAPAROSCOPIC APPENDECTOMY WITH LYSIS OF ADHESIONS;  Surgeon: Melvenia Stabs, MD;  Location: [facility] ORS;  Service: General;  Laterality: N/A;   LEFT HEART CATH AND CORONARY ANGIOGRAPHY N/A 11/24/2021   Procedure: LEFT HEART CATH AND CORONARY ANGIOGRAPHY;  Surgeon: Cody Das, MD;  Location: [facility] INVASIVE CV LAB;  Service: Cardiovascular;  Laterality: N/A;   PILONIDAL CYST EXCISION  1974   RECTAL EXAM UNDER ANESTHESIA N/A 05/25/2023   Procedure: ANORECTAL EXAM UNDER ANESTHESIA;  Surgeon: Melvenia Stabs, MD;  Location: [facility] ORS;  Service: General;  Laterality: N/A;   septic arthritis shoulder Right    TRANSANAL EXCISION OF RECTAL MASS N/A 05/25/2023   Procedure: TRANSANAL  EXCISION OF ANORECTAL LESION;  Surgeon: Melvenia Stabs, MD;  Location: [facility] ORS;  Service: General;  Laterality: N/A;       Home Medications    Prior to Admission medications   Medication Sig Start Date End Date Taking? Authorizing Provider  atorvastatin  (LIPITOR ) 80 MG tablet TAKE 1 TABLET BY MOUTH EVERY DAY 11/14/23   Tolia, Sunit, DO  clopidogrel  (PLAVIX ) 75 MG tablet TAKE 1 TABLET BY MOUTH EVERY DAY 03/15/24   Tolia, Sunit, DO  glimepiride  (AMARYL ) 2 MG tablet Take 1 tablet (2 mg total) by mouth daily before breakfast. 11/04/23   Shamleffer, Ibtehal Jaralla, MD  lisinopril  (ZESTRIL ) 10 MG tablet TAKE 1 TABLET (10 MG TOTAL) BY MOUTH EVERY MORNING. 07/12/23 07/06/24  Tolia, Sunit, DO  Metoprolol  Tartrate 37.5 MG TABS TAKE 1 TABLET BY MOUTH TWICE A DAY 03/28/23   Tolia, Sunit, DO  Multiple Vitamin (MULTIVITAMIN) tablet Take 1 tablet by mouth daily.    [provider]  nitroGLYCERIN  (NITROSTAT ) 0.4 MG SL tablet Place 1 tablet (0.4 mg total) under the tongue every 5 (five) minutes as needed for chest pain. Patient not taking: Reported on 02/24/2024 12/07/21   Donnie Galea, MD  oxyCODONE  (OXY IR/ROXICODONE ) 5 MG immediate release tablet Take 5 mg by mouth every 4 (four) hours as needed. 07/04/23   [provider]    Family History Family History  Problem Relation Age of Onset   Cancer Mother        multiple myeloma   Colon polyps Mother    Diabetes Father    Autoimmune disease Father        liver and renal disease   Colon polyps Father    Heart attack Brother    Colon polyps Brother    Diabetes Maternal Grandfather    Colon cancer Neg Hx    Prostate cancer Neg Hx    Rectal cancer Neg Hx    Stomach cancer Neg Hx     Social History Social History   Tobacco Use   Smoking status: Former    Current packs/day: 0.00    Average packs/day: 0.5 packs/day for 33.0 years (16.5 ttl pk-yrs)    Types: Cigarettes    Start date: 11/23/1988    Quit date: 11/23/2021    Years  since quitting: 2.3   Smokeless tobacco: Never   Tobacco comments:    "had a few since surgery"  Vaping Use   Vaping status: Never Used  Substance Use Topics   Alcohol use: Not Currently    Comment: rarely   Drug use: Never     Allergies   Metformin  and related and Sulfa antibiotics   Review of Systems Review of Systems Per HPI  Physical Exam Triage Vital Signs ED Triage Vitals  Encounter Vitals Group     BP 03/15/24 1830 (!) 140/77     Systolic BP Percentile --      Diastolic BP Percentile --      Pulse Rate 03/15/24 1830 83     Resp 03/15/24 1830 20  Temp 03/15/24 1830 98.4 F (36.9 C)     Temp Source 03/15/24 1830 Oral     SpO2 03/15/24 1830 92 %     Weight --      Height --      Head Circumference --      Peak Flow --      Pain Score 03/15/24 1829 0     Pain Loc --      Pain Education --      Exclude from Growth Chart --    No data found.  Updated Vital Signs BP (!) 140/77 (BP Location: Right Arm)   Pulse 83   Temp 98.4 F (36.9 C) (Oral)   Resp 20   SpO2 92%   Visual Acuity Right Eye Distance:   Left Eye Distance:   Bilateral Distance:    Right Eye Near:   Left Eye Near:    Bilateral Near:     Physical Exam Vitals and nursing note reviewed.  Constitutional:      General: He is not in acute distress.    Appearance: Normal appearance.  HENT:     Head: Normocephalic.     Right Ear: Tympanic membrane, ear canal and external ear normal.     Left Ear: Tympanic membrane, ear canal and external ear normal.     Nose: Nose normal.     Mouth/Throat:     Mouth: Mucous membranes are moist.  Eyes:     Extraocular Movements: Extraocular movements intact.     Conjunctiva/sclera: Conjunctivae normal.     Pupils: Pupils are equal, round, and reactive to light.  Cardiovascular:     Rate and Rhythm: Normal rate and regular rhythm.     Pulses: Normal pulses.     Heart sounds: Normal heart sounds.  Pulmonary:     Effort: Pulmonary effort is normal.  No respiratory distress.     Breath sounds: Examination of the right-lower field reveals rhonchi. Examination of the left-lower field reveals rhonchi. Rhonchi present.  Abdominal:     General: Bowel sounds are normal.     Palpations: Abdomen is soft.     Tenderness: There is no abdominal tenderness.  Musculoskeletal:     Cervical back: Normal range of motion.  Skin:    General: Skin is warm and dry.  Neurological:     General: No focal deficit present.     Mental Status: He is alert and oriented to person, place, and time.  Psychiatric:        Mood and Affect: Mood normal.        Behavior: Behavior normal.     UC Treatments / Results  Labs (all labs ordered are listed, but only abnormal results are displayed) Labs Reviewed - No data to display  EKG   Radiology No results found.  Procedures Procedures (including critical care time)  Medications Ordered in UC Medications - No data to display  Initial Impression / Assessment and Plan / UC Course  I have reviewed the triage vital signs and the nursing notes.  Pertinent labs & imaging results that were available during my care of the patient were reviewed by me and considered in my medical decision making (see chart for details).  Patient with almost 2-week history of chest congestion, cough, and wheezing.  On exam, he does have rhonchi that remains present with cough.  No audible wheezing present.  Room air sats at 92%.  Chest x-ray is pending.  Will treat for lower respiratory infection  with Augmentin  875/125 mg tablets twice daily, prednisone  20 mg for bronchial inflammation, and albuterol  inhaler for wheezing and shortness of breath, and Promethazine  DM for cough.  Supportive care recommendations were provided and discussed with the patient to include fluids, rest, over-the-counter analgesics, and use of a humidifier during sleep.  Discussed indications with patient regarding follow-up.  Patient was in agreement with this plan  of care and verbalizes understanding.  All questions were answered.  Patient stable for discharge.  Final Clinical Impressions(s) / UC Diagnoses   Final diagnoses:  None   Discharge Instructions   None    ED Prescriptions   None    PDMP not reviewed this encounter.   Hardy Lia, NP 03/15/24 1906

## 2024-03-16 ENCOUNTER — Ambulatory Visit: Payer: PPO | Admitting: Internal Medicine

## 2024-03-16 ENCOUNTER — Telehealth: Payer: Self-pay | Admitting: Nurse Practitioner

## 2024-03-16 NOTE — Telephone Encounter (Signed)
 Reviewed the patient's chest x-ray results.  Call patient to discuss results.  Verified patient with 2 patient identifiers.  Advised patient that x-ray showed bronchitic changes along with a small right pleural effusion.  Patient was advised to follow-up with his PCP within the next 5 to 7 days for reevaluation.  Patient advised to follow-up in the emergency department for worsening symptoms.  Patient was in agreement with this plan of care and verbalized understanding.  All questions were answered.

## 2024-03-19 ENCOUNTER — Ambulatory Visit: Payer: PPO | Admitting: Internal Medicine

## 2024-03-20 ENCOUNTER — Ambulatory Visit (INDEPENDENT_AMBULATORY_CARE_PROVIDER_SITE_OTHER)

## 2024-03-20 VITALS — BP 140/77 | Ht 69.0 in | Wt 216.0 lb

## 2024-03-20 DIAGNOSIS — Z0000 Encounter for general adult medical examination without abnormal findings: Secondary | ICD-10-CM

## 2024-03-20 DIAGNOSIS — Z5320 Procedure and treatment not carried out because of patient's decision for unspecified reasons: Secondary | ICD-10-CM

## 2024-03-20 DIAGNOSIS — Z2821 Immunization not carried out because of patient refusal: Secondary | ICD-10-CM

## 2024-03-20 NOTE — Patient Instructions (Signed)
 Stephen Shelton , Thank you for taking time to come for your Medicare Wellness Visit. I appreciate your ongoing commitment to your health goals. Please review the following plan we discussed and let me know if I can assist you in the future.   Referrals/Orders/Follow-Ups/Clinician Recommendations: follow up as scheduled.  This is a list of the screening recommended for you and due dates:  Health Maintenance  Topic Date Due   Eye exam for diabetics  Never done   COVID-19 Vaccine (4 - 2024-25 season) 07/24/2023   Colon Cancer Screening  02/01/2024   Hemoglobin A1C  05/04/2024   Yearly kidney function blood test for diabetes  05/12/2024   Yearly kidney health urinalysis for diabetes  05/12/2024   Flu Shot  06/22/2024   Complete foot exam   11/03/2024   Medicare Annual Wellness Visit  03/20/2025   DTaP/Tdap/Td vaccine (3 - Td or Tdap) 05/10/2029   Pneumonia Vaccine  Completed   Hepatitis C Screening  Completed   Zoster (Shingles) Vaccine  Completed   HPV Vaccine  Aged Out   Meningitis B Vaccine  Aged Out    Advanced directives: (Copy Requested) Please bring a copy of your health care power of attorney and living will to the office to be added to your chart at your convenience. You can mail to [facility] [address] [place], [place] [address] or email to [email or web address]  Next Medicare Annual Wellness Visit scheduled for next year: Yes

## 2024-03-20 NOTE — Progress Notes (Signed)
 Because this visit was a virtual/telehealth visit,  certain criteria was not obtained, such a blood pressure, CBG if applicable, and timed get up and go. Any medications not marked as "taking" were not mentioned during the medication reconciliation part of the visit. Any vitals not documented were not able to be obtained due to this being a telehealth visit or patient was unable to self-report a recent blood pressure reading due to a lack of equipment at home via telehealth. Vitals that have been documented are verbally provided by the patient.  Subjective:   Stephen Shelton is a 68 y.o. who presents for a Medicare Wellness preventive visit.  Visit Complete: Virtual I connected with  Stephen Shelton on 03/20/24 by a audio enabled telemedicine application and verified that I am speaking with the correct person using two identifiers.  Patient Location: Home  Provider Location: Home Office  I discussed the limitations of evaluation and management by telemedicine. The patient expressed understanding and agreed to proceed.  Vital Signs: Because this visit was a virtual/telehealth visit, some criteria may be missing or patient reported. Any vitals not documented were not able to be obtained and vitals that have been documented are patient reported.  VideoDeclined- This patient declined Librarian, academic. Therefore the visit was completed with audio only.  Persons Participating in Visit: Patient.  AWV Questionnaire: No: Patient Medicare AWV questionnaire was not completed prior to this visit.  Cardiac Risk Factors include: advanced age (>31men, >64 women);male gender;diabetes mellitus;obesity (BMI >30kg/m2)     Objective:    Today's Vitals   03/20/24 0816 03/20/24 0817  BP: (!) 140/77   Weight: 216 lb (98 kg)   Height: 5\' 9"  (1.753 m)   PainSc:  0-No pain   Body mass index is 31.9 kg/m.     03/20/2024    8:21 AM 05/25/2023    6:39 AM 05/23/2023    8:01 AM  04/25/2023    6:33 AM 04/21/2023    9:21 AM 11/23/2021   10:49 PM  Advanced Directives  Does Patient Have a Medical Advance Directive? Yes Yes Yes Yes Yes No  Type of Estate agent of [place];Living will Healthcare Power of [place];Living will Living will;Healthcare Power of [employer] Power of [place];Living will Healthcare Power of [place];Living will   Does patient want to make changes to medical advance directive? No - Patient declined   No - Patient declined No - Patient declined   Copy of Healthcare Power of Attorney in Chart? No - copy requested No - copy requested  No - copy requested No - copy requested     Current Medications (verified) Outpatient Encounter Medications as of 03/20/2024  Medication Sig   albuterol  (VENTOLIN  HFA) 108 (90 Base) MCG/ACT inhaler Inhale 2 puffs into the lungs every 6 (six) hours as needed.   amoxicillin -clavulanate (AUGMENTIN ) 875-125 MG tablet Take 1 tablet by mouth every 12 (twelve) hours.   atorvastatin  (LIPITOR ) 80 MG tablet TAKE 1 TABLET BY MOUTH EVERY DAY   clopidogrel  (PLAVIX ) 75 MG tablet TAKE 1 TABLET BY MOUTH EVERY DAY   glimepiride  (AMARYL ) 2 MG tablet Take 1 tablet (2 mg total) by mouth daily before breakfast.   lisinopril  (ZESTRIL ) 10 MG tablet TAKE 1 TABLET (10 MG TOTAL) BY MOUTH EVERY MORNING.   Metoprolol  Tartrate 37.5 MG TABS TAKE 1 TABLET BY MOUTH TWICE A DAY   Multiple Vitamin (MULTIVITAMIN) tablet Take 1 tablet by mouth daily.   predniSONE  (DELTASONE ) 20 MG tablet  Take 1 tablet (20 mg total) by mouth daily with breakfast for 7 days.   promethazine -dextromethorphan (PROMETHAZINE -DM) 6.25-15 MG/5ML syrup Take 5 mLs by mouth 4 (four) times daily as needed.   nitroGLYCERIN  (NITROSTAT ) 0.4 MG SL tablet Place 1 tablet (0.4 mg total) under the tongue every 5 (five) minutes as needed for chest pain. (Patient not taking: Reported on 02/24/2024)   oxyCODONE  (OXY IR/ROXICODONE ) 5 MG immediate release tablet Take 5 mg by  mouth every 4 (four) hours as needed. (Patient not taking: Reported on 03/20/2024)   No facility-administered encounter medications on file as of 03/20/2024.    Allergies (verified) Metformin  and related and Sulfa antibiotics   History: Past Medical History:  Diagnosis Date   Coronary artery disease    Diabetes mellitus (HCC)    diet controlled   Hyperlipidemia    Hypertension    Myocardial infarction (HCC)    11-25-2021   CABG   Pneumonia    Septic arthritis (HCC)    R shoulder, history of   Smoker    Past Surgical History:  Procedure Laterality Date   APPENDECTOMY     COLON SURGERY     CORONARY ARTERY BYPASS GRAFT Right 11/25/2021   Procedure: CORONARY ARTERY BYPASS GRAFTING (CABG) X 4  ON PUMP USING LEFT INTERNAL MAMMARY ARTERY AND RIGHT ENDOSCOPIC GREATER SAPHENOUS VEIN CONDUITS;  Surgeon: Zelphia Higashi, MD;  Location: [facility] OR;  Service: Open Heart Surgery;  Laterality: Right;  Right radial artery harvest   ENDOVEIN HARVEST OF GREATER SAPHENOUS VEIN Right 11/25/2021   Procedure: ENDOVEIN HARVEST OF GREATER SAPHENOUS VEIN;  Surgeon: Zelphia Higashi, MD;  Location[facility];  Service: Open Heart Surgery;  Laterality: Right;   LAPAROSCOPIC APPENDECTOMY N/A 04/25/2023   Procedure: LAPAROSCOPIC APPENDECTOMY WITH LYSIS OF ADHESIONS;  Surgeon: Melvenia Stabs, MD;  Location: [facility] ORS;  Service: General;  Laterality: N/A;   LEFT HEART CATH AND CORONARY ANGIOGRAPHY N/A 11/24/2021   Procedure: LEFT HEART CATH AND CORONARY ANGIOGRAPHY;  Surgeon: Cody Das, MD;  Location: [facility] INVASIVE CV LAB;  Service: Cardiovascular;  Laterality: N/A;   PILONIDAL CYST EXCISION  1974   RECTAL EXAM UNDER ANESTHESIA N/A 05/25/2023   Procedure: ANORECTAL EXAM UNDER ANESTHESIA;  Surgeon: Melvenia Stabs, MD;  Location: [facility] ORS;  Service: General;  Laterality: N/A;   septic arthritis shoulder Right    TRANSANAL EXCISION OF RECTAL MASS N/A 05/25/2023   Procedure: TRANSANAL EXCISION OF  ANORECTAL LESION;  Surgeon: Melvenia Stabs, MD;  Location: [facility] ORS;  Service: General;  Laterality: N/A;   Family History  Problem Relation Age of Onset   Cancer Mother        multiple myeloma   Colon polyps Mother    Diabetes Father    Autoimmune disease Father        liver and renal disease   Colon polyps Father    Heart attack Brother    Colon polyps Brother    Diabetes Maternal Grandfather    Colon cancer Neg Hx    Prostate cancer Neg Hx    Rectal cancer Neg Hx    Stomach cancer Neg Hx    Social History   Socioeconomic History   Marital status: Married    Spouse name: Not on file   Number of children: 3   Years of education: Not on file   Highest education level: Not on file  Occupational History   Occupation: self employed  Tobacco Use   Smoking status: Former  Current packs/day: 0.00    Average packs/day: 0.5 packs/day for 33.0 years (16.5 ttl pk-yrs)    Types: Cigarettes    Start date: 11/23/1988    Quit date: 11/23/2021    Years since quitting: 2.3   Smokeless tobacco: Never   Tobacco comments:    "had a few since surgery"  Vaping Use   Vaping status: Never Used  Substance and Sexual Activity   Alcohol use: Not Currently    Comment: rarely   Drug use: Never   Sexual activity: Not Currently  Other Topics Concern   Not on file  Social History Narrative   [employer] grad '79   Married 1985   Visual merchandiser   Social Drivers of Corporate investment banker Strain: Not on file  Food Insecurity: No Food Insecurity (03/20/2024)   Hunger Vital Sign    Worried About Running Out of Food in the Last Year: Never true    Ran Out of Food in the Last Year: Never true  Transportation Needs: No Transportation Needs (03/20/2024)   PRAPARE - Administrator, Civil Service (Medical): No    Lack of Transportation (Non-Medical): No  Physical Activity: Insufficiently Active (03/20/2024)   Exercise Vital Sign    Days of Exercise per Week: 4 days    Minutes of  Exercise per Session: 30 min  Stress: No Stress Concern Present (03/20/2024)   [employer] of Occupational Health - Occupational Stress Questionnaire    Feeling of Stress : Not at all  Social Connections: Moderately Integrated (03/20/2024)   Social Connection and Isolation Panel [NHANES]    Frequency of Communication with Friends and Family: More than three times a week    Frequency of Social Gatherings with Friends and Family: More than three times a week    Attends Religious Services: Never    Database administrator or Organizations: Yes    Attends Banker Meetings: Never    Marital Status: Married    Tobacco Counseling Counseling given: Not Answered Tobacco comments: "had a few since surgery"    Clinical Intake:  Pre-visit preparation completed: Yes  Pain : No/denies pain Pain Score: 0-No pain     BMI - recorded: 31.9 Nutritional Status: BMI > 30  Obese Nutritional Risks: None Diabetes: Yes CBG done?: No Did pt. bring in CBG monitor from home?: No  Lab Results  Component Value Date   HGBA1C 6.9 (A) 11/04/2023   HGBA1C 7.3 (A) 06/24/2023   HGBA1C 8.2 (H) 04/21/2023     How often do you need to have someone help you when you read instructions, pamphlets, or other written materials from your doctor or pharmacy?: 1 - Never What is the last grade level you completed in school?: 4 years of college  Interpreter Needed?: No  Information entered by :: Stephen Shelton   Activities of Daily Living     03/20/2024    8:20 AM 05/23/2023    8:03 AM  In your present state of health, do you have any difficulty performing the following activities:  Hearing? 0   Vision? 0   Difficulty concentrating or making decisions? 0   Walking or climbing stairs? 0   Dressing or bathing? 0   Doing errands, shopping? 0 0  Preparing Food and eating ? N   Using the Toilet? N   In the past six months, have you accidently leaked urine? N   Do you have problems with  loss of bowel control? N  Managing your Medications? N   Managing your Finances? N   Housekeeping or managing your Housekeeping? N     Patient Care Team: Donnie Galea, MD as PCP - General (Family Medicine) Olinda Bertrand, DO as PCP - Cardiology (Cardiology) Olinda Bertrand, DO as Consulting Physician (Cardiology)  Indicate any recent Medical Services you may have received from other than [facility] providers in the past year (date may be approximate).     Assessment:   This is a routine wellness examination for Stephen Shelton.  Hearing/Vision screen Hearing Screening - Comments:: No hearing issues Vision Screening - Comments:: No vision issues   Goals Addressed             This Visit's Progress    Patient Stated       Patient states he can't think of any        Depression Screen     03/20/2024    8:22 AM 05/20/2023    4:00 PM 01/14/2023   12:03 PM 06/11/2021    9:00 AM 05/11/2019   12:07 PM  PHQ 2/9 Scores  PHQ - 2 Score 0 0 0 0 0  PHQ- 9 Score 0 0 2 0     Fall Risk     03/20/2024    8:21 AM 05/20/2023    4:00 PM 01/14/2023   12:03 PM 04/20/2022   10:58 AM 06/11/2021    8:56 AM  Fall Risk   Falls in the past year? 0 0 0 1 0  Number falls in past yr: 0 0 0 1 0  Injury with Fall? 0 0 0 0 0  Risk for fall due to : No Fall Risks No Fall Risks No Fall Risks History of fall(s)   Follow up Falls prevention discussed;Falls evaluation completed Falls evaluation completed Falls evaluation completed Falls evaluation completed Falls evaluation completed    MEDICARE RISK AT HOME:  Medicare Risk at Home Any stairs in or around the home?: Yes If so, are there any without handrails?: No Home free of loose throw rugs in walkways, pet beds, electrical cords, etc?: Yes Adequate lighting in your home to reduce risk of falls?: Yes Life alert?: No Use of a cane, walker or w/c?: No Grab bars in the bathroom?: Yes Shower chair or bench in shower?: Yes Elevated toilet seat or a handicapped  toilet?: Yes  TIMED UP AND GO:  Was the test performed?  No  Cognitive Function: 6CIT completed        03/20/2024    8:19 AM  6CIT Screen  What Year? 0 points  What month? 0 points  What time? 0 points  Count back from 20 0 points  Months in reverse 0 points  Repeat phrase 0 points  Total Score 0 points    Immunizations Immunization History  Administered Date(s) Administered   Fluad Quad(high Dose 65+) 10/05/2022   Influenza Split 09/08/2012   Influenza Whole 09/12/2006, 11/03/2007   Influenza, High Dose Seasonal PF 08/28/2021   Influenza,inj,Quad PF,6+ Mos 11/16/2016, 01/26/2018, 09/11/2018, 09/18/2019   Influenza-Unspecified 10/10/2023   Moderna Sars-Covid-2 Vaccination 01/10/2020, 02/02/2020   PFIZER(Purple Top)SARS-COV-2 Vaccination 10/29/2020   PNEUMOCOCCAL CONJUGATE-20 06/11/2021   Pneumococcal Polysaccharide-23 09/08/2012   Td 11/29/2002   Tdap 05/11/2019   Zoster Recombinant(Shingrix) 10/06/2021, 03/17/2022    Screening Tests Health Maintenance  Topic Date Due   OPHTHALMOLOGY EXAM  Never done   COVID-19 Vaccine (4 - 2024-25 season) 07/24/2023   Colonoscopy  02/01/2024   HEMOGLOBIN A1C  05/04/2024  Diabetic kidney evaluation - eGFR measurement  05/12/2024   Diabetic kidney evaluation - Urine ACR  05/12/2024   INFLUENZA VACCINE  06/22/2024   FOOT EXAM  11/03/2024   Medicare Annual Wellness (AWV)  03/20/2025   DTaP/Tdap/Td (3 - Td or Tdap) 05/10/2029   Pneumonia Vaccine 57+ Years old  Completed   Hepatitis C Screening  Completed   Zoster Vaccines- Shingrix  Completed   HPV VACCINES  Aged Out   Meningococcal B Vaccine  Aged Out    Health Maintenance  Health Maintenance Due  Topic Date Due   OPHTHALMOLOGY EXAM  Never done   COVID-19 Vaccine (4 - 2024-25 season) 07/24/2023   Colonoscopy  02/01/2024   Health Maintenance Items Addressed:patient is scheduled for a colonoscopy in may and a eye exam. Patient denied vaccination  Additional  Screening:  Vision Screening: Recommended annual ophthalmology exams for early detection of glaucoma and other disorders of the eye.  Dental Screening: Recommended annual dental exams for proper oral hygiene  Community Resource Referral / Chronic Care Management: CRR required this visit?  No   CCM required this visit?  No     Plan:     I have personally reviewed and noted the following in the patient's chart:   Medical and social history Use of alcohol, tobacco or illicit drugs  Current medications and supplements including opioid prescriptions. Patient is not currently taking opioid prescriptions. Functional ability and status Nutritional status Physical activity Advanced directives List of other physicians Hospitalizations, surgeries, and ER visits in previous 12 months Vitals Screenings to include cognitive, depression, and falls Referrals and appointments  In addition, I have reviewed and discussed with patient certain preventive protocols, quality metrics, and best practice recommendations. A written personalized care plan for preventive services as well as general preventive health recommendations were provided to patient.     Freeda Jerry, [place]   03/20/2024   After Visit Summary: (MyChart) Due to this being a telephonic visit, the after visit summary with patients personalized plan was offered to patient via MyChart   Notes: Nothing significant to report at this time.

## 2024-03-23 ENCOUNTER — Other Ambulatory Visit: Payer: Self-pay | Admitting: Cardiology

## 2024-03-23 ENCOUNTER — Ambulatory Visit (INDEPENDENT_AMBULATORY_CARE_PROVIDER_SITE_OTHER): Admitting: Family Medicine

## 2024-03-23 ENCOUNTER — Encounter: Payer: Self-pay | Admitting: Family Medicine

## 2024-03-23 VITALS — BP 142/82 | HR 84 | Temp 97.5°F | Ht 69.0 in | Wt 220.6 lb

## 2024-03-23 DIAGNOSIS — J90 Pleural effusion, not elsewhere classified: Secondary | ICD-10-CM

## 2024-03-23 MED ORDER — AMOXICILLIN-POT CLAVULANATE 875-125 MG PO TABS: 1.0000 | ORAL_TABLET | Freq: Two times a day (BID) | ORAL | 0 refills | Status: DC

## 2024-03-23 NOTE — Patient Instructions (Addendum)
 Recheck CXR at your visit in June.  Take care.  Glad to see you. Restart antibiotics if you are worse in the meantime and update us .

## 2024-03-23 NOTE — Progress Notes (Unsigned)
 He clearly feels better in the meantime.  Done with abx, as of yesterday.  Some cough, better than prior.  Some yellow sputum but clearly better than prior. Not SOB.  His breathing is better.  No fevers.  No vomiting.  No rash.  Wheeze is better.  Rare SABA use, not today.    His wife was sick concurrently, she feels better.    Meds, vitals, and allergies reviewed.   ROS: Per HPI unless specifically indicated in ROS section   Previous x-rays reviewed with patient at office visit.  Small pleural effusion noted.  Nad Ncat Neck supple.  No lymphadenopathy. rrr Ctab Abdomen soft.  Nontender. Skin well-perfused.

## 2024-03-25 DIAGNOSIS — J90 Pleural effusion, not elsewhere classified: Secondary | ICD-10-CM | POA: Insufficient documentation

## 2024-03-25 NOTE — Assessment & Plan Note (Signed)
 Lungs are clear and he clearly feels better.  I suspected a small pleural effusion related to his concurrent infection.  Discussed options. Recheck CXR at visit in June.   Restart antibiotics if worse in the meantime and update us .  He agrees to plan.

## 2024-03-27 ENCOUNTER — Encounter: Payer: Self-pay | Admitting: Family Medicine

## 2024-04-11 ENCOUNTER — Encounter: Payer: Self-pay | Admitting: Gastroenterology

## 2024-04-11 ENCOUNTER — Ambulatory Visit (AMBULATORY_SURGERY_CENTER): Admitting: Gastroenterology

## 2024-04-11 VITALS — BP 111/75 | HR 63 | Temp 97.5°F | Resp 13 | Ht 69.0 in | Wt 225.0 lb

## 2024-04-11 DIAGNOSIS — K621 Rectal polyp: Secondary | ICD-10-CM

## 2024-04-11 DIAGNOSIS — K5730 Diverticulosis of large intestine without perforation or abscess without bleeding: Secondary | ICD-10-CM

## 2024-04-11 DIAGNOSIS — K644 Residual hemorrhoidal skin tags: Secondary | ICD-10-CM

## 2024-04-11 DIAGNOSIS — Z860101 Personal history of adenomatous and serrated colon polyps: Secondary | ICD-10-CM

## 2024-04-11 DIAGNOSIS — K648 Other hemorrhoids: Secondary | ICD-10-CM

## 2024-04-11 DIAGNOSIS — Z860100 Personal history of colon polyps, unspecified: Secondary | ICD-10-CM

## 2024-04-11 DIAGNOSIS — Z1211 Encounter for screening for malignant neoplasm of colon: Secondary | ICD-10-CM | POA: Diagnosis not present

## 2024-04-11 DIAGNOSIS — D128 Benign neoplasm of rectum: Secondary | ICD-10-CM

## 2024-04-11 MED ORDER — SODIUM CHLORIDE 0.9 % IV SOLN
500.0000 mL | Freq: Once | INTRAVENOUS | Status: DC
Start: 2024-04-11 — End: 2024-04-11

## 2024-04-11 NOTE — Progress Notes (Signed)
 Standard Gastroenterology History and Physical   Primary Care Physician:  Donnie Galea, MD   Reason for Procedure:  History of adenomatous colon polyps  Plan:    Surveillance colonoscopy with possible interventions as needed     HPI: Stephen Shelton is a very pleasant 68 y.o. male here for surveillance colonoscopy. Denies any nausea, vomiting, abdominal pain, melena or bright red blood per rectum  The risks and benefits as well as alternatives of endoscopic procedure(s) have been discussed and reviewed. All questions answered. The patient agrees to proceed.    Past Medical History:  Diagnosis Date   Coronary artery disease    Diabetes mellitus (HCC)    diet controlled   Hyperlipidemia    Hypertension    Myocardial infarction [facility])    11-25-2021   CABG   Pneumonia    Septic arthritis (HCC)    R shoulder, history of   Smoker     Past Surgical History:  Procedure Laterality Date   APPENDECTOMY     COLON SURGERY     CORONARY ARTERY BYPASS GRAFT Right 11/25/2021   Procedure: CORONARY ARTERY BYPASS GRAFTING (CABG) X 4  ON PUMP USING LEFT INTERNAL MAMMARY ARTERY AND RIGHT ENDOSCOPIC GREATER SAPHENOUS VEIN CONDUITS;  Surgeon: Zelphia Higashi, MD;  Location: [facility] OR;  Service: Open Heart Surgery;  Laterality: Right;  Right radial artery harvest   ENDOVEIN HARVEST OF GREATER SAPHENOUS VEIN Right 11/25/2021   Procedure: ENDOVEIN HARVEST OF GREATER SAPHENOUS VEIN;  Surgeon: Zelphia Higashi, MD;  Location: [facility] OR;  Service: Open Heart Surgery;  Laterality: Right;   LAPAROSCOPIC APPENDECTOMY N/A 04/25/2023   Procedure: LAPAROSCOPIC APPENDECTOMY WITH LYSIS OF ADHESIONS;  Surgeon: Melvenia Stabs, MD;  Location: [facility] ORS;  Service: General;  Laterality: N/A;   LEFT HEART CATH AND CORONARY ANGIOGRAPHY N/A 11/24/2021   Procedure: LEFT HEART CATH AND CORONARY ANGIOGRAPHY;  Surgeon: Cody Das, MD;  Location: [facility] INVASIVE CV LAB;  Service: Cardiovascular;  Laterality:  N/A;   PILONIDAL CYST EXCISION  1974   RECTAL EXAM UNDER ANESTHESIA N/A 05/25/2023   Procedure: ANORECTAL EXAM UNDER ANESTHESIA;  Surgeon: Melvenia Stabs, MD;  Location: [facility] ORS;  Service: General;  Laterality: N/A;   septic arthritis shoulder Right    TRANSANAL EXCISION OF RECTAL MASS N/A 05/25/2023   Procedure: TRANSANAL EXCISION OF ANORECTAL LESION;  Surgeon: Melvenia Stabs, MD;  Location: [facility] ORS;  Service: General;  Laterality: N/A;    Prior to Admission medications   Medication Sig Start Date End Date Taking? Authorizing Provider  albuterol  (VENTOLIN  HFA) 108 (90 Base) MCG/ACT inhaler Inhale 2 puffs into the lungs every 6 (six) hours as needed. 03/15/24   Leath-Warren, Belen Bowers, NP  amoxicillin -clavulanate (AUGMENTIN ) 875-125 MG tablet Take 1 tablet by mouth every 12 (twelve) hours. 03/23/24   Donnie Galea, MD  atorvastatin  (LIPITOR ) 80 MG tablet TAKE 1 TABLET BY MOUTH EVERY DAY 11/14/23   Tolia, Sunit, DO  clopidogrel  (PLAVIX ) 75 MG tablet TAKE 1 TABLET BY MOUTH EVERY DAY 03/15/24   Tolia, Sunit, DO  glimepiride  (AMARYL ) 2 MG tablet Take 1 tablet (2 mg total) by mouth daily before breakfast. 11/04/23   Shamleffer, Ibtehal Jaralla, MD  lisinopril  (ZESTRIL ) 10 MG tablet TAKE 1 TABLET (10 MG TOTAL) BY MOUTH EVERY MORNING. 07/12/23 07/06/24  Tolia, Sunit, DO  Metoprolol  Tartrate 37.5 MG TABS TAKE 1 TABLET BY MOUTH TWICE A DAY 03/23/24   Tolia, Sunit, DO  Multiple Vitamin (MULTIVITAMIN) tablet Take 1  tablet by mouth daily.    [provider]  nitroGLYCERIN  (NITROSTAT ) 0.4 MG SL tablet Place 1 tablet (0.4 mg total) under the tongue every 5 (five) minutes as needed for chest pain. 12/07/21   Donnie Galea, MD  promethazine -dextromethorphan (PROMETHAZINE -DM) 6.25-15 MG/5ML syrup Take 5 mLs by mouth 4 (four) times daily as needed. 03/15/24   Leath-Warren, Belen Bowers, NP    Current Outpatient Medications  Medication Sig Dispense Refill   albuterol  (VENTOLIN  HFA) 108 (90 Base)  MCG/ACT inhaler Inhale 2 puffs into the lungs every 6 (six) hours as needed. 8 g 0   amoxicillin -clavulanate (AUGMENTIN ) 875-125 MG tablet Take 1 tablet by mouth every 12 (twelve) hours. 14 tablet 0   atorvastatin  (LIPITOR ) 80 MG tablet TAKE 1 TABLET BY MOUTH EVERY DAY 90 tablet 2   clopidogrel  (PLAVIX ) 75 MG tablet TAKE 1 TABLET BY MOUTH EVERY DAY 90 tablet 3   glimepiride  (AMARYL ) 2 MG tablet Take 1 tablet (2 mg total) by mouth daily before breakfast. 90 tablet 3   lisinopril  (ZESTRIL ) 10 MG tablet TAKE 1 TABLET (10 MG TOTAL) BY MOUTH EVERY MORNING. 90 tablet 3   Metoprolol  Tartrate 37.5 MG TABS TAKE 1 TABLET BY MOUTH TWICE A DAY 180 tablet 3   Multiple Vitamin (MULTIVITAMIN) tablet Take 1 tablet by mouth daily.     nitroGLYCERIN  (NITROSTAT ) 0.4 MG SL tablet Place 1 tablet (0.4 mg total) under the tongue every 5 (five) minutes as needed for chest pain. 50 tablet 3   promethazine -dextromethorphan (PROMETHAZINE -DM) 6.25-15 MG/5ML syrup Take 5 mLs by mouth 4 (four) times daily as needed. 118 mL 0   Current Facility-Administered Medications  Medication Dose Route Frequency Provider Last Rate Last Admin   0.9 %  sodium chloride  infusion  500 mL Intravenous Once Danel Studzinski V, MD        Allergies as of 04/11/2024 - Review Complete 03/23/2024  Allergen Reaction Noted   Metformin  and related Other (See Comments) 01/26/2018   Sulfa antibiotics Other (See Comments) 08/07/2013    Family History  Problem Relation Age of Onset   Cancer Mother        multiple myeloma   Colon polyps Mother    Diabetes Father    Autoimmune disease Father        liver and renal disease   Colon polyps Father    Heart attack Brother    Colon polyps Brother    Diabetes Maternal Grandfather    Colon cancer Neg Hx    Prostate cancer Neg Hx    Rectal cancer Neg Hx    Stomach cancer Neg Hx     Social History   Socioeconomic History   Marital status: Married    Spouse name: Not on file   Number of  children: 3   Years of education: Not on file   Highest education level: Not on file  Occupational History   Occupation: self employed  Tobacco Use   Smoking status: Former    Current packs/day: 0.00    Average packs/day: 0.5 packs/day for 33.0 years (16.5 ttl pk-yrs)    Types: Cigarettes    Start date: 11/23/1988    Quit date: 11/23/2021    Years since quitting: 2.3   Smokeless tobacco: Never   Tobacco comments:    "had a few since surgery"  Vaping Use   Vaping status: Never Used  Substance and Sexual Activity   Alcohol use: Not Currently    Comment: rarely   Drug use:  Never   Sexual activity: Not Currently  Other Topics Concern   Not on file  Social History Narrative   [employer] grad '79   Married 1985   Visual merchandiser   Social Drivers of [employer] Strain: Not on [employer] Insecurity: No Food Insecurity (03/20/2024)   Hunger Vital Sign    Worried About Running Out of Food in the Last Year: Never true    Ran Out of Food in the Last Year: Never true  Transportation Needs: No Transportation Needs (03/20/2024)   PRAPARE - Administrator, Civil Service (Medical): No    Lack of Transportation (Non-Medical): No  Physical Activity: Insufficiently Active (03/20/2024)   Exercise Vital Sign    Days of Exercise per Week: 4 days    Minutes of Exercise per Session: 30 min  Stress: No Stress Concern Present (03/20/2024)   [employer] of Occupational Health - Occupational Stress Questionnaire    Feeling of Stress : Not at all  Social Connections: Moderately Integrated (03/20/2024)   Social Connection and Isolation Panel [NHANES]    Frequency of Communication with Friends and Family: More than three times a week    Frequency of Social Gatherings with Friends and Family: More than three times a week    Attends Religious Services: Never    Database administrator or Organizations: Yes    Attends Banker Meetings: Never    Marital Status: Married   Catering manager Violence: Not At Risk (03/20/2024)   Humiliation, Afraid, Rape, and Kick questionnaire    Fear of Current or Ex-Partner: No    Emotionally Abused: No    Physically Abused: No    Sexually Abused: No    Review of Systems:  All other review of systems negative except as mentioned in the HPI.  Physical Exam: Vital signs in last 24 hours: BP 113/73   Pulse 73   Temp (!) 97.5 F (36.4 C) (Other (Comment)) Comment (Src): forehead  Ht 5\' 9"  (1.753 m)   Wt 225 lb (102.1 kg)   SpO2 98%   BMI 33.23 kg/m  General:   Alert, NAD Lungs:  Clear .   Heart:  Regular rate and rhythm Abdomen:  Soft, nontender and nondistended. Neuro/Psych:  Alert and cooperative. Normal mood and affect. A and O x 3  Reviewed labs, radiology imaging, old records and pertinent past GI work up  Patient is appropriate for planned procedure(s) and anesthesia in an ambulatory setting   K. Veena Leanne Sisler , MD [phone]

## 2024-04-11 NOTE — Op Note (Addendum)
 [facility] Patient Name: Stephen Shelton Procedure Date: 04/11/2024 2:38 PM MRN: [id number] Endoscopist: Sergio Dandy , MD, [id number] Age: 68 Referring MD:  Date of Birth: 1956-09-25 Gender: Male Account #: [id number] Procedure:                Colonoscopy Indications:              High risk colon cancer surveillance: Personal                            history of adenoma (10 mm or greater in size),                            appendiceal orifice polyp s/p appendectomy Medicines:                Monitored Anesthesia Care Procedure:                Pre-Anesthesia Assessment:                           - Prior to the procedure, a History and Physical                            was performed, and patient medications and                            allergies were reviewed. The patient's tolerance of                            previous anesthesia was also reviewed. The risks                            and benefits of the procedure and the sedation                            options and risks were discussed with the patient.                            All questions were answered, and informed consent                            was obtained. Prior Anticoagulants: The patient                            last took Plavix  (clopidogrel ) 5 days prior to the                            procedure. ASA Grade Assessment: III - A patient                            with severe systemic disease. After reviewing the                            risks and benefits, the patient was deemed in  satisfactory condition to undergo the procedure.                           After obtaining informed consent, the colonoscope                            was passed under direct vision. Throughout the                            procedure, the patient's blood pressure, pulse, and                            oxygen saturations were monitored continuously. The                             Olympus Scope SN [id number] was introduced through the                            anus and advanced to the the cecum, identified by                            appendiceal orifice and ileocecal valve. The                            colonoscopy was performed without difficulty. The                            patient tolerated the procedure well. The quality                            of the bowel preparation was adequate to identify                            polyps greater than 5 mm in size. The ileocecal                            valve, appendiceal orifice, and rectum were                            photographed. Scope In: 2:41:17 PM Scope Out: 2:57:37 PM Scope Withdrawal Time: 0 hours 12 minutes 20 seconds  Total Procedure Duration: 0 hours 16 minutes 20 seconds  Findings:                 The perianal and digital rectal examinations were                            normal.                           A 5 mm polyp was found in the rectum. The polyp was                            sessile. The polyp was removed with a cold snare.  Resection and retrieval were complete.                           Scattered small-mouthed diverticula were found in                            the sigmoid colon and descending colon.                           Non-bleeding external and internal hemorrhoids were                            found during retroflexion. The hemorrhoids were                            medium-sized. Complications:            No immediate complications. Estimated Blood Loss:     Estimated blood loss was minimal. Impression:               - One 5 mm polyp in the rectum, removed with a cold                            snare. Resected and retrieved.                           - Diverticulosis in the sigmoid colon and in the                            descending colon.                           - Non-bleeding external and internal hemorrhoids. Recommendation:           -  Patient has a contact number available for                            emergencies. The signs and symptoms of potential                            delayed complications were discussed with the                            patient. Return to normal activities tomorrow.                            Written discharge instructions were provided to the                            patient.                           - Resume previous diet.                           - Continue present medications.                           -  Await pathology results.                           - Repeat colonoscopy in 3 years for surveillance.                           - Resume Plavix  (clopidogrel ) at prior dose                            tomorrow. Refer to managing physician for further                            adjustment of therapy. [name] [name], MD 04/11/2024 3:01:50 PM This report has been signed electronically.

## 2024-04-11 NOTE — Patient Instructions (Signed)
 Handouts provided on polyps, diverticulosis and hemorrhoids.  Resume previous diet.  Continue present medications.  Resume Plavix  (clopidogrel ) at prior dose tomorrow. Refer to managing physician for further adjustment of therapy.  Await pathology results.  Repeat colonoscopy in 3 years for surveillance.   YOU HAD AN ENDOSCOPIC PROCEDURE TODAY AT THE [facility]:   Refer to the procedure report that was given to you for any specific questions about what was found during the examination.  If the procedure report does not answer your questions, please call your gastroenterologist to clarify.  If you requested that your care partner not be given the details of your procedure findings, then the procedure report has been included in a sealed envelope for you to review at your convenience later.  YOU SHOULD EXPECT: Some feelings of bloating in the abdomen. Passage of more gas than usual.  Walking can help get rid of the air that was put into your GI tract during the procedure and reduce the bloating. If you had a lower endoscopy (such as a colonoscopy or flexible sigmoidoscopy) you may notice spotting of blood in your stool or on the toilet paper. If you underwent a bowel prep for your procedure, you may not have a normal bowel movement for a few days.  Please Note:  You might notice some irritation and congestion in your nose or some drainage.  This is from the oxygen used during your procedure.  There is no need for concern and it should clear up in a day or so.  SYMPTOMS TO REPORT IMMEDIATELY:  Following lower endoscopy (colonoscopy or flexible sigmoidoscopy):  Excessive amounts of blood in the stool  Significant tenderness or worsening of abdominal pains  Swelling of the abdomen that is new, acute  Fever of 100F or higher   For urgent or emergent issues, a gastroenterologist can be reached at any hour by calling [phone]. Do not use MyChart messaging for urgent concerns.     DIET:  We do recommend a small meal at first, but then you may proceed to your regular diet.  Drink plenty of fluids but you should avoid alcoholic beverages for 24 hours.  ACTIVITY:  You should plan to take it easy for the rest of today and you should NOT DRIVE or use heavy machinery until tomorrow (because of the sedation medicines used during the test).    FOLLOW UP: Our staff will call the number listed on your records the next business day following your procedure.  We will call around 7:15- 8:00 am to check on you and address any questions or concerns that you may have regarding the information given to you following your procedure. If we do not reach you, we will leave a message.     If any biopsies were taken you will be contacted by phone or by letter within the next 1-3 weeks.  Please call us  at [phone] if you have not heard about the biopsies in 3 weeks.    SIGNATURES/CONFIDENTIALITY: You and/or your care partner have signed paperwork which will be entered into your electronic medical record.  These signatures attest to the fact that that the information above on your After Visit Summary has been reviewed and is understood.  Full responsibility of the confidentiality of this discharge information lies with you and/or your care-partner.

## 2024-04-11 NOTE — Progress Notes (Signed)
 Called to room to assist during endoscopic procedure.  Patient ID and intended procedure confirmed with present staff. Received instructions for my participation in the procedure from the performing physician.

## 2024-04-11 NOTE — Progress Notes (Signed)
 A/ox3, pleased with MAC, report to RN

## 2024-04-12 ENCOUNTER — Telehealth: Payer: Self-pay

## 2024-04-12 NOTE — Telephone Encounter (Signed)
 RN attempted follow up call to patient; received voice message which stated the mailbox is full. Unable to leave vm.

## 2024-04-13 ENCOUNTER — Encounter: Payer: Self-pay | Admitting: Gastroenterology

## 2024-04-17 LAB — SURGICAL PATHOLOGY

## 2024-04-24 ENCOUNTER — Ambulatory Visit: Admitting: Internal Medicine

## 2024-04-24 ENCOUNTER — Encounter: Payer: Self-pay | Admitting: Internal Medicine

## 2024-04-24 VITALS — BP 120/74 | HR 66 | Ht 69.0 in | Wt 219.0 lb

## 2024-04-24 DIAGNOSIS — E1059 Type 1 diabetes mellitus with other circulatory complications: Secondary | ICD-10-CM | POA: Diagnosis not present

## 2024-04-24 DIAGNOSIS — E10319 Type 1 diabetes mellitus with unspecified diabetic retinopathy without macular edema: Secondary | ICD-10-CM

## 2024-04-24 DIAGNOSIS — E1042 Type 1 diabetes mellitus with diabetic polyneuropathy: Secondary | ICD-10-CM

## 2024-04-24 LAB — POCT GLYCOSYLATED HEMOGLOBIN (HGB A1C): Hemoglobin A1C: 6.7 % — AB (ref 4.0–5.6)

## 2024-04-24 MED ORDER — GLIMEPIRIDE 2 MG PO TABS: 2.0000 mg | ORAL_TABLET | Freq: Every day | ORAL | 3 refills | Status: DC

## 2024-04-24 NOTE — Progress Notes (Addendum)
 Name: Stephen Shelton  Age/ Sex: 68 y.o., male   MRN/ DOB: [id number], Jun 22, 1956     PCP: Donnie Galea, MD   Reason for Endocrinology Evaluation: Type 2 Diabetes Mellitus  Initial Endocrine Consultative Visit: 08/27/2020    PATIENT IDENTIFIER: Mr. Stephen Shelton is a 68 y.o. male with a past medical history of T2DM, HTN, CAD (S/P CABG). The patient has followed with Endocrinology clinic since 08/27/2020 for consultative assistance with management of his diabetes.  DIABETIC HISTORY:  Stephen Shelton was diagnosed with T2DM in 2004, but this was changed to LADA by 06/2020 after he was found to have an elevated GAD-65 at 89.7 U/mL .   He was also checked for C-Peptide in 06/2020 at 2.6 ng/mL with serum glucose 121 mg/dL    He was initially on Metformin  but this  caused Abdominal pain. His hemoglobin A1c has ranged from 5.9% in 2021, peaking at 10.9% in 2019.   He was on insulin  from 12/2017 until 12/2019, he was taken off insulin  by  [facility] health program  ( which is a keto diet) and after losing   30 lbs .    He is on  Creon  for pancreatic insufficiency , has been having loose stools since 2019 years which has helped. Celiac test negative.    Maternal Grandfather with DM Father with DM  Daughter with T1DM and thyroid disease    Started on Lantus  06/2023 through his PCPs office, but this was discontinued 10/2023 due to inconsistent intake and started on glimepiride  with an A1c of 6.9%   SUBJECTIVE:   During the last visit (11/04/2023): A1c 6.9%    Today (04/24/2024): Stephen Shelton is here for a follow up on diabetes care. He has NOT been to our clinic in 20 months.  He checks his blood sugars 2 times daily. The patient has  had  not hypoglycemic episodes since the last clinic visit    He is s/p CABG 11/2021 after he presented to the ED for non-ST elevation MI Has chronic loose stools, denies nausea or vomiting   He was diagnosed and treated for bronchitis last month, continues with  occasional coughing Denies nausea or vomiting  Denies constipation or diarrhea   HOME DIABETES REGIMEN:  Glimepiride  2 mg daily    Statin: yes ACE-I/ARB: yes    METER DOWNLOAD SUMMARY: 4/26-5/21/2025 Fingerstick Blood Glucose Tests = 2 Average Number Tests/Day = 0.1 Overall Mean FS Glucose = 124   BG Target % Results: % In target = 100 % Over target = 0 % Under target = 0   DIABETIC COMPLICATIONS: Microvascular complications:  S/P laser treatment for DR Denies: CKD,  neuropathy  Last Eye Exam: Completed 12/2023  Macrovascular complications:  Non-ST elevation MI, S/P CABG ( 11/2021) Denies: CVA, PVD   HISTORY:  Past Medical History:  Past Medical History:  Diagnosis Date   Coronary artery disease    Diabetes mellitus (HCC)    diet controlled   Hyperlipidemia    Hypertension    Myocardial infarction (HCC)    11-25-2021   CABG   Pneumonia    Septic arthritis (HCC)    R shoulder, history of   Smoker    Past Surgical History:  Past Surgical History:  Procedure Laterality Date   APPENDECTOMY     COLON SURGERY     CORONARY ARTERY BYPASS GRAFT Right 11/25/2021   Procedure: CORONARY ARTERY BYPASS GRAFTING (CABG) X 4  ON PUMP USING LEFT INTERNAL MAMMARY ARTERY AND  RIGHT ENDOSCOPIC GREATER SAPHENOUS VEIN CONDUITS;  Surgeon: Zelphia Higashi, MD;  Location: [facility] OR;  Service: Open Heart Surgery;  Laterality: Right;  Right radial artery harvest   ENDOVEIN HARVEST OF GREATER SAPHENOUS VEIN Right 11/25/2021   Procedure: ENDOVEIN HARVEST OF GREATER SAPHENOUS VEIN;  Surgeon: Zelphia Higashi, MD;  Location: [facility] OR;  Service: Open Heart Surgery;  Laterality: Right;   LAPAROSCOPIC APPENDECTOMY N/A 04/25/2023   Procedure: LAPAROSCOPIC APPENDECTOMY WITH LYSIS OF ADHESIONS;  Surgeon: Melvenia Stabs, MD;  Location: [facility] ORS;  Service: General;  Laterality: N/A;   LEFT HEART CATH AND CORONARY ANGIOGRAPHY N/A 11/24/2021   Procedure: LEFT HEART CATH AND CORONARY  ANGIOGRAPHY;  Surgeon: Cody Das, MD;  Location: [facility] INVASIVE CV LAB;  Service: Cardiovascular;  Laterality: N/A;   PILONIDAL CYST EXCISION  1974   RECTAL EXAM UNDER ANESTHESIA N/A 05/25/2023   Procedure: ANORECTAL EXAM UNDER ANESTHESIA;  Surgeon: Melvenia Stabs, MD;  Location: [facility] ORS;  Service: General;  Laterality: N/A;   septic arthritis shoulder Right    TRANSANAL EXCISION OF RECTAL MASS N/A 05/25/2023   Procedure: TRANSANAL EXCISION OF ANORECTAL LESION;  Surgeon: Melvenia Stabs, MD;  Location: [facility] ORS;  Service: General;  Laterality: N/A;   Social History:  reports that he quit smoking about 2 years ago. His smoking use included cigarettes. He started smoking about 35 years ago. He has a 16.5 pack-year smoking history. He has never used smokeless tobacco. He reports that he does not currently use alcohol. He reports that he does not use drugs. Family History:  Family History  Problem Relation Age of Onset   Cancer Mother        multiple myeloma   Colon polyps Mother    Diabetes Father    Autoimmune disease Father        liver and renal disease   Colon polyps Father    Heart attack Brother    Colon polyps Brother    Diabetes Maternal Grandfather    Colon cancer Neg Hx    Prostate cancer Neg Hx    Rectal cancer Neg Hx    Stomach cancer Neg Hx    Esophageal cancer Neg Hx      HOME MEDICATIONS: Allergies as of 04/24/2024       Reactions   Metformin  And Related Other (See Comments)   abd pain with med use   Sulfa Antibiotics Other (See Comments)   Lightheaded, intolerant, "felt sick"        Medication List        Accurate as of April 24, 2024 12:45 PM. If you have any questions, ask your nurse or doctor.          albuterol  108 (90 Base) MCG/ACT inhaler Commonly known as: VENTOLIN  HFA Inhale 2 puffs into the lungs every 6 (six) hours as needed.   atorvastatin  80 MG tablet Commonly known as: LIPITOR  TAKE 1 TABLET BY MOUTH EVERY DAY   clopidogrel   75 MG tablet Commonly known as: PLAVIX  TAKE 1 TABLET BY MOUTH EVERY DAY   glimepiride  2 MG tablet Commonly known as: AMARYL  Take 1 tablet (2 mg total) by mouth daily before breakfast.   lisinopril  10 MG tablet Commonly known as: ZESTRIL  TAKE 1 TABLET (10 MG TOTAL) BY MOUTH EVERY MORNING.   Metoprolol  Tartrate 37.5 MG Tabs TAKE 1 TABLET BY MOUTH TWICE A DAY   multivitamin tablet Take 1 tablet by mouth daily.   nitroGLYCERIN  0.4 MG SL tablet Commonly known  as: NITROSTAT  Place 1 tablet (0.4 mg total) under the tongue every 5 (five) minutes as needed for chest pain.         OBJECTIVE:   Vital Signs: BP 120/74 (BP Location: Left Arm, Patient Position: Sitting, Cuff Size: Normal)   Pulse 66   Ht 5\' 9"  (1.753 m)   Wt 219 lb (99.3 kg)   SpO2 98%   BMI 32.34 kg/m   Wt Readings from Last 3 Encounters:  04/24/24 219 lb (99.3 kg)  04/11/24 225 lb (102.1 kg)  03/23/24 220 lb 9.6 oz (100.1 kg)     Exam: General: Pt appears well and is in NAD  Lungs: Coarse breathing sounds bilaterally  Heart: RRR   Abdomen: Soft, nontender  Extremities: No pretibial edema.  Neuro: MS is good with appropriate affect, pt is alert and Ox3   DM foot exam: 11/04/2023   The skin of the feet is intact without sores or ulcerations. The pedal pulses are 1+ on right and 1+ on left. The sensation is intact  to a screening 5.07, 10 gram monofilament on the right     DATA REVIEWED:   Results for Stephen Shelton, Stephen Shelton" (MRN [id number]) as of 06/25/2021 10:08  Ref. Range 06/24/2021 09:01  Glucose, Plasma Latest Ref Range: 65 - 139 mg/dL 409   C-peptide 8.11 ng/mL     Lab Results  Component Value Date   HGBA1C 6.7 (A) 04/24/2024   HGBA1C 6.9 (A) 11/04/2023   HGBA1C 7.3 (A) 06/24/2023    Latest Reference Range & Units 05/13/23 08:27  Sodium 135 - 145 mEq/L 141  Potassium 3.5 - 5.1 mEq/L 4.5  Chloride 96 - 112 mEq/L 103  CO2 19 - 32 mEq/L 30  Glucose 70 - 99 mg/dL 914 (H)  BUN 6 - 23  mg/dL 13  Creatinine 7.82 - 9.56 mg/dL 2.13  Calcium  8.4 - 10.5 mg/dL 9.6  Alkaline Phosphatase 39 - 117 U/L 71  Albumin  3.5 - 5.2 g/dL 4.1  AST 0 - 37 U/L 20  ALT 0 - 53 U/L 20  Total Protein 6.0 - 8.3 g/dL 6.5  Total Bilirubin 0.2 - 1.2 mg/dL 1.0  GFR >08.65 mL/min 94.45    Latest Reference Range & Units 05/13/23 08:27  Total CHOL/HDL Ratio  2  Cholesterol 0 - 200 mg/dL 88  HDL Cholesterol >78.46 mg/dL 96.29 (L)  LDL (calc) 0 - 99 mg/dL 32  MICROALB/CREAT RATIO 0.0 - 30.0 mg/g 1.1  NonHDL  51.46  Triglycerides 0.0 - 149.0 mg/dL 52.8  VLDL 0.0 - 41.3 mg/dL 24.4  (L): Data is abnormally low   ASSESSMENT / PLAN / RECOMMENDATIONS:   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 Goal A1c < 7.0 %.  -A1c is optimal -He did have 1 hypoglycemic episode during colonoscopy, because he took glimepiride  while n.p.o., I did explain to the patient the importance of eating a meal within 15-20 minutes of taking glimepiride  - He will not be a good candidate for GLP-1 agonists due to pancreatic insufficiency nor for SGLT-2 inhibitors due to increased risk of DKA in the setting of autoimmune diabetes mellitus. -He would like to avoid metformin  due GI side effects  - He is past due on labs, but the patient states he has a physical scheduled for June 30 and will have labs then   MEDICATIONS:  Continue glimepiride  2 mg daily  EDUCATION / INSTRUCTIONS: BG  monitoring instructions: Patient is instructed to check his blood sugars 1 times a day Call [facility] if: BG persistently < 70  I reviewed the Rule of 15 for the treatment of hypoglycemia in detail with the patient. Literature supplied.     2) Diabetic complications:  Eye: Does not have known diabetic retinopathy.  Neuro/ Feet: Does  have known diabetic peripheral neuropathy .  Renal: Patient  does not have known baseline CKD.    3) Microalbuminuria:   - Slight elevation at 44 mg / gram in 2021, he was started on lisinopril , he is tolerating this well. -This has resolved in 2022 - Past due for MA/CR ratio, patient will have this done at PCPs office during physical   Medication  Continue lisinopril  10 mg daily    F/U in 6 months     Signed electronically by: Natale Bail, MD  [facility] Endocrinology  [facility] [address] [place] [place], [place] [address] Phone: [phone] FAX: [phone]   CC: Donnie Galea, MD [address] [place] [address] Phone: [phone]  Fax: [phone]  Return to Endocrinology clinic as below: Future Appointments  Date Time Provider Department Center  05/21/2024  9:30 AM Donnie Galea, MD [facility]  06/06/2024  2:00 PM Rexene Catching, MD [facility] None  10/25/2024  7:30 AM Sabrinia Prien, Julian Obey, MD [facility] None  03/21/2025  8:10 AM [facility] ANNUAL WELLNESS VISIT 2 [facility]

## 2024-04-24 NOTE — Patient Instructions (Signed)
 Continue glimepiride  2 mg, 1 tablet before the first meal of the day     HOW TO TREAT LOW BLOOD SUGARS (Blood sugar LESS THAN 70 MG/DL) Please follow the RULE OF 15 for the treatment of hypoglycemia treatment (when your (blood sugars are less than 70 mg/dL)   STEP 1: Take 15 grams of carbohydrates when your blood sugar is low, which includes:  3-4 GLUCOSE TABS  OR 3-4 OZ OF JUICE OR REGULAR SODA OR ONE TUBE OF GLUCOSE GEL    STEP 2: RECHECK blood sugar in 15 MINUTES STEP 3: If your blood sugar is still low at the 15 minute recheck --> then, go back to STEP 1 and treat AGAIN with another 15 grams of carbohydrates.

## 2024-05-17 ENCOUNTER — Telehealth: Payer: Self-pay

## 2024-05-17 DIAGNOSIS — E785 Hyperlipidemia, unspecified: Secondary | ICD-10-CM

## 2024-05-17 DIAGNOSIS — E1129 Type 2 diabetes mellitus with other diabetic kidney complication: Secondary | ICD-10-CM

## 2024-05-17 DIAGNOSIS — Z125 Encounter for screening for malignant neoplasm of prostate: Secondary | ICD-10-CM

## 2024-05-17 NOTE — Telephone Encounter (Signed)
 I put in the orders.  Please schedule labs when possible.  Thanks.

## 2024-05-17 NOTE — Telephone Encounter (Signed)
 Copied from CRM [id number]. Topic: Clinical - Request for Lab/Test Order >> May 17, 2024  8:58 AM Viola F wrote: Patient has physical scheduled for 05/21/24 and would like his labs done prior to appt - please call him to schedule lab appt once orders are in. He would like to come in tomorrow morning 05/18/24

## 2024-05-17 NOTE — Addendum Note (Signed)
 Addended by: CLEATUS ARLYSS RAMAN on: 05/17/2024 02:09 PM   Modules accepted: Orders

## 2024-05-17 NOTE — Telephone Encounter (Signed)
 Attempted to call patient several times to schedule him to come in for lab work with no success. Sent [employer].

## 2024-05-18 ENCOUNTER — Other Ambulatory Visit (INDEPENDENT_AMBULATORY_CARE_PROVIDER_SITE_OTHER)

## 2024-05-18 DIAGNOSIS — E785 Hyperlipidemia, unspecified: Secondary | ICD-10-CM | POA: Diagnosis not present

## 2024-05-18 DIAGNOSIS — Z125 Encounter for screening for malignant neoplasm of prostate: Secondary | ICD-10-CM | POA: Diagnosis not present

## 2024-05-18 DIAGNOSIS — E1129 Type 2 diabetes mellitus with other diabetic kidney complication: Secondary | ICD-10-CM

## 2024-05-18 DIAGNOSIS — R809 Proteinuria, unspecified: Secondary | ICD-10-CM | POA: Diagnosis not present

## 2024-05-18 LAB — COMPREHENSIVE METABOLIC PANEL WITH GFR
ALT: 21 U/L (ref 0–53)
AST: 20 U/L (ref 0–37)
Albumin: 4.1 g/dL (ref 3.5–5.2)
Alkaline Phosphatase: 71 U/L (ref 39–117)
BUN: 10 mg/dL (ref 6–23)
CO2: 28 meq/L (ref 19–32)
Calcium: 8.8 mg/dL (ref 8.4–10.5)
Chloride: 106 meq/L (ref 96–112)
Creatinine, Ser: 0.67 mg/dL (ref 0.40–1.50)
GFR: 96.24 mL/min (ref >60.00)
Glucose, Bld: 159 mg/dL — ABNORMAL HIGH (ref 70–99)
Potassium: 3.6 meq/L (ref 3.5–5.1)
Sodium: 140 meq/L (ref 135–145)
Total Bilirubin: 0.7 mg/dL (ref 0.2–1.2)
Total Protein: 6.3 g/dL (ref 6.0–8.3)

## 2024-05-18 LAB — CBC WITH DIFFERENTIAL/PLATELET
Basophils Absolute: 0.1 10*3/uL (ref 0.0–0.1)
Basophils Relative: 0.7 % (ref 0.0–3.0)
Eosinophils Absolute: 0.3 10*3/uL (ref 0.0–0.7)
Eosinophils Relative: 3.7 % (ref 0.0–5.0)
HCT: 41.9 % (ref 39.0–52.0)
Hemoglobin: 14 g/dL (ref 13.0–17.0)
Lymphocytes Relative: 15.5 % (ref 12.0–46.0)
Lymphs Abs: 1.1 10*3/uL (ref 0.7–4.0)
MCHC: 33.5 g/dL (ref 30.0–36.0)
MCV: 83.1 fl (ref 78.0–100.0)
Monocytes Absolute: 0.6 10*3/uL (ref 0.1–1.0)
Monocytes Relative: 8.1 % (ref 3.0–12.0)
Neutro Abs: 4.9 10*3/uL (ref 1.4–7.7)
Neutrophils Relative %: 72 % (ref 43.0–77.0)
Platelets: 191 10*3/uL (ref 150.0–400.0)
RBC: 5.04 Mil/uL (ref 4.22–5.81)
RDW: 16.4 % — ABNORMAL HIGH (ref 11.5–15.5)
WBC: 6.9 10*3/uL (ref 4.0–10.5)

## 2024-05-18 LAB — PSA, MEDICARE: PSA: 0.93 ng/mL (ref 0.10–4.00)

## 2024-05-18 LAB — LIPID PANEL
Cholesterol: 73 mg/dL (ref 0–200)
HDL: 32.7 mg/dL — ABNORMAL LOW (ref >39.00)
LDL Cholesterol: 22 mg/dL (ref 0–99)
NonHDL: 40.25
Total CHOL/HDL Ratio: 2
Triglycerides: 90 mg/dL (ref 0.0–149.0)
VLDL: 18 mg/dL (ref 0.0–40.0)

## 2024-05-18 NOTE — Telephone Encounter (Signed)
 Called pt to get scheduled for labs but vm is full unable to leave a message to return call to office.

## 2024-05-20 ENCOUNTER — Ambulatory Visit: Payer: Self-pay | Admitting: Family Medicine

## 2024-05-21 ENCOUNTER — Encounter: Payer: Self-pay | Admitting: Family Medicine

## 2024-05-21 ENCOUNTER — Ambulatory Visit (INDEPENDENT_AMBULATORY_CARE_PROVIDER_SITE_OTHER): Admitting: Family Medicine

## 2024-05-21 ENCOUNTER — Ambulatory Visit (INDEPENDENT_AMBULATORY_CARE_PROVIDER_SITE_OTHER)
Admission: RE | Admit: 2024-05-21 | Discharge: 2024-05-21 | Disposition: A | Discharge status: Home / Self Care | Source: Ambulatory Visit | Attending: Family Medicine | Admitting: Family Medicine

## 2024-05-21 VITALS — BP 124/62 | HR 63 | Temp 97.7°F | Ht 70.12 in | Wt 220.8 lb

## 2024-05-21 DIAGNOSIS — I10 Essential (primary) hypertension: Secondary | ICD-10-CM | POA: Diagnosis not present

## 2024-05-21 DIAGNOSIS — E1129 Type 2 diabetes mellitus with other diabetic kidney complication: Secondary | ICD-10-CM | POA: Diagnosis not present

## 2024-05-21 DIAGNOSIS — J90 Pleural effusion, not elsewhere classified: Secondary | ICD-10-CM

## 2024-05-21 DIAGNOSIS — Z7189 Other specified counseling: Secondary | ICD-10-CM

## 2024-05-21 DIAGNOSIS — E10319 Type 1 diabetes mellitus with unspecified diabetic retinopathy without macular edema: Secondary | ICD-10-CM | POA: Diagnosis not present

## 2024-05-21 DIAGNOSIS — Z0000 Encounter for general adult medical examination without abnormal findings: Secondary | ICD-10-CM

## 2024-05-21 DIAGNOSIS — E785 Hyperlipidemia, unspecified: Secondary | ICD-10-CM | POA: Diagnosis not present

## 2024-05-21 LAB — MICROALBUMIN / CREATININE URINE RATIO
Creatinine,U: 93.3 mg/dL
Microalb Creat Ratio: 13.3 mg/g (ref 0.0–30.0)
Microalb, Ur: 1.2 mg/dL (ref 0.0–1.9)

## 2024-05-21 NOTE — Telephone Encounter (Signed)
Closing encounter. Patient was seen in office today.

## 2024-05-21 NOTE — Patient Instructions (Signed)
 Go to the lab on the way out.   If you have mychart we'll likely use that to update you.    Take care.  Glad to see you. I'll update endo.

## 2024-05-21 NOTE — Progress Notes (Unsigned)
 Diabetes:  Using medications without difficulties: yes Hypoglycemic episodes:no Hyperglycemic episodes:no except for the day of his colonoscopy- cautions d/w pt.   Feet problems:no Blood Sugars averaging: ~125 in the AMs, usually ~100-120s at night.   eye exam within last year: yes, requesting records.   He is adherent to diet, d/w pt.   Still seeing endo.   Hypertension:    Using medication without problems or lightheadedness: yes Chest pain with exertion:no Edema:no Short of breath:no Labs d/w pt.    Elevated Cholesterol: Using medications without problems: yes Muscle aches: no Diet compliance: yes Exercise:yes Labs d/w pt.    D/w pt about prev pleural effusion.  Recheck CXR today.    Flu previously done Shingles previously done PNA previously done Tetanus 2020 COVID-vaccine previously done RSV d/w pt.   Colonoscopy 2025 Prostate cancer screening 2025 Advance directive-wife designated if patient were incapacitated.  PMH and SH reviewed.   Vital signs, Meds and allergies reviewed.  ROS: Per HPI unless specifically indicated in ROS section   GEN: nad, alert and oriented HEENT: ncat NECK: supple w/o LA CV: rrr PULM: ctab, no inc wob, no focal decrease in breath sounds. ABD: soft, +bs EXT: no edema SKIN: no acute rash

## 2024-05-22 ENCOUNTER — Encounter: Payer: Self-pay | Admitting: Family Medicine

## 2024-05-23 ENCOUNTER — Ambulatory Visit: Payer: Self-pay | Admitting: Family Medicine

## 2024-05-23 NOTE — Assessment & Plan Note (Signed)
Labs discussed with patient.  Continue work on diet and exercise.  Continue atorvastatin. ?

## 2024-05-23 NOTE — Assessment & Plan Note (Signed)
 D/w pt about prev pleural effusion.  Recheck CXR today.  See notes on imaging.

## 2024-05-23 NOTE — Assessment & Plan Note (Signed)
 Flu previously done Shingles previously done PNA previously done Tetanus 2020 COVID-vaccine previously done RSV d/w pt.   Colonoscopy 2025 Prostate cancer screening 2025 Advance directive-wife designated if patient were incapacitated.

## 2024-05-23 NOTE — Assessment & Plan Note (Signed)
 Labs discussed with patient.  Continue work on diet and exercise.  Continue lisinopril  metoprolol .

## 2024-05-23 NOTE — Assessment & Plan Note (Signed)
 Blood Sugars averaging: ~125 in the AMs, usually ~100-120s at night.   eye exam within last year: yes, requesting records.   He is adherent to diet, d/w pt.   Still seeing endo.  A1c at goal previously.  Microalbumin collected today.  See notes on labs.  Continue glimepiride  as is.

## 2024-05-23 NOTE — Assessment & Plan Note (Signed)
 Advance directive- wife designated if patient were incapacitated.

## 2024-06-06 ENCOUNTER — Encounter (INDEPENDENT_AMBULATORY_CARE_PROVIDER_SITE_OTHER): Admitting: Ophthalmology

## 2024-06-06 DIAGNOSIS — E113592 Type 2 diabetes mellitus with proliferative diabetic retinopathy without macular edema, left eye: Secondary | ICD-10-CM

## 2024-06-06 DIAGNOSIS — H43813 Vitreous degeneration, bilateral: Secondary | ICD-10-CM

## 2024-06-06 DIAGNOSIS — H35033 Hypertensive retinopathy, bilateral: Secondary | ICD-10-CM

## 2024-06-06 DIAGNOSIS — D3132 Benign neoplasm of left choroid: Secondary | ICD-10-CM

## 2024-06-06 DIAGNOSIS — E113311 Type 2 diabetes mellitus with moderate nonproliferative diabetic retinopathy with macular edema, right eye: Secondary | ICD-10-CM | POA: Diagnosis not present

## 2024-06-06 DIAGNOSIS — I10 Essential (primary) hypertension: Secondary | ICD-10-CM

## 2024-06-06 DIAGNOSIS — Z7984 Long term (current) use of oral hypoglycemic drugs: Secondary | ICD-10-CM

## 2024-06-22 ENCOUNTER — Ambulatory Visit: Payer: Self-pay | Admitting: Gastroenterology

## 2024-07-01 ENCOUNTER — Other Ambulatory Visit: Payer: Self-pay | Admitting: Cardiology

## 2024-07-01 DIAGNOSIS — I10 Essential (primary) hypertension: Secondary | ICD-10-CM

## 2024-07-01 DIAGNOSIS — Z951 Presence of aortocoronary bypass graft: Secondary | ICD-10-CM

## 2024-07-01 DIAGNOSIS — I2510 Atherosclerotic heart disease of native coronary artery without angina pectoris: Secondary | ICD-10-CM

## 2024-08-01 DIAGNOSIS — I10 Essential (primary) hypertension: Secondary | ICD-10-CM | POA: Diagnosis not present

## 2024-08-07 ENCOUNTER — Other Ambulatory Visit: Payer: Self-pay | Admitting: Cardiology

## 2024-10-09 NOTE — Telephone Encounter (Signed)
 Open in error

## 2024-10-10 ENCOUNTER — Encounter (INDEPENDENT_AMBULATORY_CARE_PROVIDER_SITE_OTHER): Admitting: Ophthalmology

## 2024-10-24 ENCOUNTER — Encounter (INDEPENDENT_AMBULATORY_CARE_PROVIDER_SITE_OTHER): Admitting: Ophthalmology

## 2024-10-24 DIAGNOSIS — H43813 Vitreous degeneration, bilateral: Secondary | ICD-10-CM

## 2024-10-24 DIAGNOSIS — I10 Essential (primary) hypertension: Secondary | ICD-10-CM | POA: Diagnosis not present

## 2024-10-24 DIAGNOSIS — Z7984 Long term (current) use of oral hypoglycemic drugs: Secondary | ICD-10-CM

## 2024-10-24 DIAGNOSIS — E113512 Type 2 diabetes mellitus with proliferative diabetic retinopathy with macular edema, left eye: Secondary | ICD-10-CM | POA: Diagnosis not present

## 2024-10-24 DIAGNOSIS — H35033 Hypertensive retinopathy, bilateral: Secondary | ICD-10-CM

## 2024-10-24 DIAGNOSIS — E113391 Type 2 diabetes mellitus with moderate nonproliferative diabetic retinopathy without macular edema, right eye: Secondary | ICD-10-CM | POA: Diagnosis not present

## 2024-10-25 ENCOUNTER — Ambulatory Visit: Admitting: Internal Medicine

## 2024-10-25 ENCOUNTER — Encounter: Payer: Self-pay | Admitting: Internal Medicine

## 2024-10-25 VITALS — BP 148/84 | Ht 70.0 in | Wt 225.0 lb

## 2024-10-25 DIAGNOSIS — E139 Other specified diabetes mellitus without complications: Secondary | ICD-10-CM

## 2024-10-25 DIAGNOSIS — E10319 Type 1 diabetes mellitus with unspecified diabetic retinopathy without macular edema: Secondary | ICD-10-CM | POA: Diagnosis not present

## 2024-10-25 DIAGNOSIS — E1042 Type 1 diabetes mellitus with diabetic polyneuropathy: Secondary | ICD-10-CM | POA: Diagnosis not present

## 2024-10-25 DIAGNOSIS — E1059 Type 1 diabetes mellitus with other circulatory complications: Secondary | ICD-10-CM

## 2024-10-25 LAB — POCT GLYCOSYLATED HEMOGLOBIN (HGB A1C): Hemoglobin A1C: 7 % — AB (ref 4.0–5.6)

## 2024-10-25 MED ORDER — GLIMEPIRIDE 2 MG PO TABS: 2.0000 mg | ORAL_TABLET | Freq: Every day | ORAL | 3 refills | Status: AC

## 2024-10-25 NOTE — Progress Notes (Signed)
 Name: Stephen Shelton  Age/ Sex: 68 y.o., male   MRN/ DOB: [id number], 05/21/1956     PCP: Cleatus Arlyss RAMAN, MD   Reason for Endocrinology Evaluation: Type 2 Diabetes Mellitus  Initial Endocrine Consultative Visit: 08/27/2020    PATIENT IDENTIFIER: Stephen Shelton is a 68 y.o. male with a past medical history of T2DM, HTN, CAD (S/P CABG). The patient has followed with Endocrinology clinic since 08/27/2020 for consultative assistance with management of his diabetes.  DIABETIC HISTORY:  Stephen Shelton was diagnosed with T2DM in 2004, but this was changed to LADA by 06/2020 after he was found to have an elevated GAD-65 at 89.7 U/mL .   He was also checked for C-Peptide in 06/2020 at 2.6 ng/mL with serum glucose 121 mg/dL    He was initially on Metformin  but this  caused Abdominal pain. His hemoglobin A1c has ranged from 5.9% in 2021, peaking at 10.9% in 2019.   He was on insulin  from 12/2017 until 12/2019, he was taken off insulin  by  [facility] health program  ( which is a keto diet) and after losing   30 lbs .    He is on  Creon  for pancreatic insufficiency , has been having loose stools since 2019 years which has helped. Celiac test negative.    Maternal Grandfather with DM Father with DM  Daughter with T1DM and thyroid disease    Started on Lantus  06/2023 through his PCPs office, but this was discontinued 10/2023 due to inconsistent intake and started on glimepiride  with an A1c of 6.9%   SUBJECTIVE:   During the last visit (04/24/2024): A1c 6.7%    Today (10/25/2024): Stephen Shelton is here for a follow up on diabetes care.   He checks his blood sugars occasionally. The patient has  had  not hypoglycemic episodes since the last clinic visit    He is s/p CABG 11/2021  No nausea or vomiting  No constipation  Follows with Retina specialist for b/l eye treatment   HOME DIABETES REGIMEN:  Glimepiride  2 mg daily    Statin: yes ACE-I/ARB: yes    METER DOWNLOAD SUMMARY:  94 - 152 mg/dL     DIABETIC COMPLICATIONS: Microvascular complications:  S/P laser treatment for DR Denies: CKD,  neuropathy  Last Eye Exam: Completed 10/24/2024  Macrovascular complications:  Non-ST elevation MI, S/P CABG ( 11/2021) Denies: CVA, PVD   HISTORY:  Past Medical History:  Past Medical History:  Diagnosis Date   Coronary artery disease    Diabetes mellitus (HCC)    diet controlled   Hyperlipidemia    Hypertension    Myocardial infarction (HCC)    11-25-2021   CABG   Pneumonia    Septic arthritis (HCC)    R shoulder, history of   Smoker    Past Surgical History:  Past Surgical History:  Procedure Laterality Date   APPENDECTOMY     COLON SURGERY     CORONARY ARTERY BYPASS GRAFT Right 11/25/2021   Procedure: CORONARY ARTERY BYPASS GRAFTING (CABG) X 4  ON PUMP USING LEFT INTERNAL MAMMARY ARTERY AND RIGHT ENDOSCOPIC GREATER SAPHENOUS VEIN CONDUITS;  Surgeon: Kerrin Elspeth BROCKS, MD;  Location: [facility] OR;  Service: Open Heart Surgery;  Laterality: Right;  Right radial artery harvest   ENDOVEIN HARVEST OF GREATER SAPHENOUS VEIN Right 11/25/2021   Procedure: ENDOVEIN HARVEST OF GREATER SAPHENOUS VEIN;  Surgeon: Kerrin Elspeth BROCKS, MD;  Location[facility] OR;  Service: Open Heart Surgery;  Laterality: Right;   LAPAROSCOPIC  APPENDECTOMY N/A 04/25/2023   Procedure: LAPAROSCOPIC APPENDECTOMY WITH LYSIS OF ADHESIONS;  Surgeon: Teresa Lonni HERO, MD;  Location: [facility] ORS;  Service: General;  Laterality: N/A;   LEFT HEART CATH AND CORONARY ANGIOGRAPHY N/A 11/24/2021   Procedure: LEFT HEART CATH AND CORONARY ANGIOGRAPHY;  Surgeon: Elmira Newman PARAS, MD;  Location: [facility] INVASIVE CV LAB;  Service: Cardiovascular;  Laterality: N/A;   PILONIDAL CYST EXCISION  1974   RECTAL EXAM UNDER ANESTHESIA N/A 05/25/2023   Procedure: ANORECTAL EXAM UNDER ANESTHESIA;  Surgeon: Teresa Lonni HERO, MD;  Location: [facility]S;  Service: General;  Laterality: N/A;   septic arthritis shoulder Right    TRANSANAL EXCISION OF  RECTAL MASS N/A 05/25/2023   Procedure: TRANSANAL EXCISION OF ANORECTAL LESION;  Surgeon: Teresa Lonni HERO, MD;  Location: [facility] ORS;  Service: General;  Laterality: N/A;   Social History:  reports that he quit smoking about 2 years ago. His smoking use included cigarettes. He started smoking about 35 years ago. He has a 16.5 pack-year smoking history. He has never used smokeless tobacco. He reports that he does not currently use alcohol. He reports that he does not use drugs. Family History:  Family History  Problem Relation Age of Onset   Cancer Mother        multiple myeloma   Colon polyps Mother    Diabetes Father    Autoimmune disease Father        liver and renal disease   Colon polyps Father    Heart attack Brother    Colon polyps Brother    Diabetes Maternal Grandfather    Colon cancer Neg Hx    Prostate cancer Neg Hx    Rectal cancer Neg Hx    Stomach cancer Neg Hx    Esophageal cancer Neg Hx      HOME MEDICATIONS: Allergies as of 10/25/2024       Reactions   Metformin  And Related Other (See Comments)   abd pain with med use   Sulfa Antibiotics Other (See Comments)   Lightheaded, intolerant, felt sick        Medication List        Accurate as of October 25, 2024  8:50 AM. If you have any questions, ask your nurse or doctor.          albuterol  108 (90 Base) MCG/ACT inhaler Commonly known as: VENTOLIN  HFA Inhale 2 puffs into the lungs every 6 (six) hours as needed.   atorvastatin  80 MG tablet Commonly known as: LIPITOR  TAKE 1 TABLET BY MOUTH EVERY DAY   clopidogrel  75 MG tablet Commonly known as: PLAVIX  TAKE 1 TABLET BY MOUTH EVERY DAY   glimepiride  2 MG tablet Commonly known as: AMARYL  Take 1 tablet (2 mg total) by mouth daily before breakfast.   lisinopril  10 MG tablet Commonly known as: ZESTRIL  TAKE 1 TABLET (10 MG TOTAL) BY MOUTH EVERY MORNING.   Metoprolol  Tartrate 37.5 MG Tabs TAKE 1 TABLET BY MOUTH TWICE A DAY   multivitamin  tablet Take 1 tablet by mouth daily.   nitroGLYCERIN  0.4 MG SL tablet Commonly known as: NITROSTAT  Place 1 tablet (0.4 mg total) under the tongue every 5 (five) minutes as needed for chest pain.         OBJECTIVE:   Vital Signs: There were no vitals taken for this visit.  Wt Readings from Last 3 Encounters:  05/21/24 220 lb 12.8 oz (100.2 kg)  04/24/24 219 lb (99.3 kg)  04/11/24 225 lb (102.1 kg)  Exam: General: Pt appears well and is in NAD  Lungs: Coarse breathing sounds bilaterally  Heart: RRR   Abdomen: Soft, nontender  Extremities: No pretibial edema.  Neuro: MS is good with appropriate affect, pt is alert and Ox3   DM foot exam: 10/25/2024   The skin of the feet is intact without sores or ulcerations. The pedal pulses are 1+ on right and 1+ on left. The sensation is intact  to a screening 5.07, 10 gram monofilament on the right     DATA REVIEWED:    Latest Reference Range & Units 05/18/24 13:53 05/21/24 10:21  Sodium 135 - 145 mEq/L 140   Potassium 3.5 - 5.1 mEq/L 3.6   Chloride 96 - 112 mEq/L 106   CO2 19 - 32 mEq/L 28   Glucose 70 - 99 mg/dL 840 (H)   BUN 6 - 23 mg/dL 10   Creatinine 9.59 - 1.50 mg/dL 9.32   Calcium  8.4 - 10.5 mg/dL 8.8   Alkaline Phosphatase 39 - 117 U/L 71   Albumin  3.5 - 5.2 g/dL 4.1   AST 0 - 37 U/L 20   ALT 0 - 53 U/L 21   Total Protein 6.0 - 8.3 g/dL 6.3   Total Bilirubin 0.2 - 1.2 mg/dL 0.7   GFR >39.99 mL/min 96.24   Total CHOL/HDL Ratio  2   Cholesterol 0 - 200 mg/dL 73   HDL Cholesterol >60.99 mg/dL 67.29 (L)   LDL (calc) 0 - 99 mg/dL 22   MICROALB/CREAT RATIO 0.0 - 30.0 mg/g  13.3  NonHDL  40.25   Triglycerides 0.0 - 149.0 mg/dL 09.9   VLDL 0.0 - 59.9 mg/dL 81.9      Results for Guse, Stephen Shelton (MRN [id number]) as of 06/25/2021 10:08  Ref. 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 Goal A1c < 7.0 %.  -A1c is optimal - He will not be a good candidate for GLP-1 agonists due to pancreatic insufficiency nor for SGLT-2 inhibitors due to increased risk of DKA in the setting of autoimmune diabetes mellitus. -He would like to avoid metformin  due GI side effects  - We discussed the importance of regular glucose checks at home as well as low-carb diet    MEDICATIONS:  Continue glimepiride  2 mg daily  EDUCATION / INSTRUCTIONS: BG monitoring instructions: Patient is instructed to check his blood sugars 1 times a day Call [facility] if: BG persistently < 70  I reviewed the Rule of 15 for the treatment of hypoglycemia in detail with the patient. Literature supplied.     2) Diabetic complications:  Eye: Does not have known diabetic retinopathy.  Neuro/ Feet: Does  have known diabetic peripheral neuropathy .  Renal: Patient does not have known baseline CKD.    3) Microalbuminuria:   - Slight elevation at 44 mg / gram in 2021, he was started on lisinopril , he is tolerating this well. -This has resolved in 2022 - Up-to-date on MA/CR ratio   Medication  Continue lisinopril  10 mg daily    F/U in 6 months  Signed electronically by: Stefano Redgie Butts, MD  [facility] Endocrinology  [facility] [address] [place] [place], [place] [address] Phone: [phone] FAX: [phone]   CC: Cleatus Arlyss RAMAN, MD [address] [place] [address] Phone: [phone]  Fax: [phone]  Return to Endocrinology clinic as below: Future Appointments  Date Time Provider Department Center  12/05/2024  1:15 PM Alvia Norleen BIRCH, MD [facility] None   03/05/2025  8:50 AM [facility] ANNUAL WELLNESS VISIT 2 [facility] [address]

## 2024-10-25 NOTE — Patient Instructions (Signed)
 Continue glimepiride  2 mg, 1 tablet before the first meal of the day     HOW TO TREAT LOW BLOOD SUGARS (Blood sugar LESS THAN 70 MG/DL) Please follow the RULE OF 15 for the treatment of hypoglycemia treatment (when your (blood sugars are less than 70 mg/dL)   STEP 1: Take 15 grams of carbohydrates when your blood sugar is low, which includes:  3-4 GLUCOSE TABS  OR 3-4 OZ OF JUICE OR REGULAR SODA OR ONE TUBE OF GLUCOSE GEL    STEP 2: RECHECK blood sugar in 15 MINUTES STEP 3: If your blood sugar is still low at the 15 minute recheck --> then, go back to STEP 1 and treat AGAIN with another 15 grams of carbohydrates.

## 2024-11-03 ENCOUNTER — Other Ambulatory Visit: Payer: Self-pay | Admitting: Cardiology

## 2024-12-05 ENCOUNTER — Encounter (INDEPENDENT_AMBULATORY_CARE_PROVIDER_SITE_OTHER): Admitting: Ophthalmology

## 2024-12-05 DIAGNOSIS — E113391 Type 2 diabetes mellitus with moderate nonproliferative diabetic retinopathy without macular edema, right eye: Secondary | ICD-10-CM | POA: Diagnosis not present

## 2024-12-05 DIAGNOSIS — H35033 Hypertensive retinopathy, bilateral: Secondary | ICD-10-CM

## 2024-12-05 DIAGNOSIS — Z7985 Long-term (current) use of injectable non-insulin antidiabetic drugs: Secondary | ICD-10-CM

## 2024-12-05 DIAGNOSIS — I10 Essential (primary) hypertension: Secondary | ICD-10-CM | POA: Diagnosis not present

## 2024-12-05 DIAGNOSIS — H43813 Vitreous degeneration, bilateral: Secondary | ICD-10-CM

## 2024-12-05 DIAGNOSIS — D3132 Benign neoplasm of left choroid: Secondary | ICD-10-CM | POA: Diagnosis not present

## 2024-12-05 DIAGNOSIS — E113512 Type 2 diabetes mellitus with proliferative diabetic retinopathy with macular edema, left eye: Secondary | ICD-10-CM | POA: Diagnosis not present

## 2024-12-05 DIAGNOSIS — H2513 Age-related nuclear cataract, bilateral: Secondary | ICD-10-CM

## 2025-01-10 ENCOUNTER — Ambulatory Visit: Admitting: Cardiology

## 2025-01-16 ENCOUNTER — Encounter (INDEPENDENT_AMBULATORY_CARE_PROVIDER_SITE_OTHER): Admitting: Ophthalmology

## 2025-03-05 ENCOUNTER — Ambulatory Visit

## 2025-03-11 ENCOUNTER — Ambulatory Visit

## 2025-03-21 ENCOUNTER — Ambulatory Visit
